# Patient Record
Sex: Male | Born: 1950 | Race: Black or African American | Hispanic: No | Marital: Single | Smoking: Never smoker
Health system: Southern US, Community
[De-identification: ages and names within clinical notes are randomized; demographics above are authoritative.]

## PROBLEM LIST (undated history)

## (undated) DIAGNOSIS — J45909 Unspecified asthma, uncomplicated: Secondary | ICD-10-CM

## (undated) DIAGNOSIS — Z95810 Presence of automatic (implantable) cardiac defibrillator: Secondary | ICD-10-CM

## (undated) DIAGNOSIS — I10 Essential (primary) hypertension: Secondary | ICD-10-CM

---

## 2003-02-19 ENCOUNTER — Emergency Department (HOSPITAL_COMMUNITY): Admission: EM | Admit: 2003-02-19 | Discharge: 2003-02-19 | Payer: Self-pay | Admitting: Emergency Medicine

## 2003-02-19 ENCOUNTER — Encounter: Payer: Self-pay | Admitting: Emergency Medicine

## 2009-04-10 ENCOUNTER — Emergency Department (HOSPITAL_COMMUNITY): Admission: EM | Admit: 2009-04-10 | Discharge: 2009-04-10 | Payer: Self-pay | Admitting: *Deleted

## 2013-11-05 ENCOUNTER — Telehealth: Payer: Self-pay | Admitting: *Deleted

## 2013-11-08 NOTE — Telephone Encounter (Signed)
error 

## 2017-06-30 ENCOUNTER — Inpatient Hospital Stay (HOSPITAL_COMMUNITY)
Admission: EM | Admit: 2017-06-30 | Discharge: 2017-07-03 | DRG: 280 | Disposition: A | Discharge status: Home / Self Care | Payer: Medicare PPO | Source: Other Acute Inpatient Hospital | Attending: Internal Medicine | Admitting: Internal Medicine

## 2017-06-30 ENCOUNTER — Encounter (HOSPITAL_COMMUNITY): Payer: Self-pay

## 2017-06-30 DIAGNOSIS — I214 Non-ST elevation (NSTEMI) myocardial infarction: Secondary | ICD-10-CM | POA: Diagnosis present

## 2017-06-30 DIAGNOSIS — R7989 Other specified abnormal findings of blood chemistry: Secondary | ICD-10-CM

## 2017-06-30 DIAGNOSIS — I110 Hypertensive heart disease with heart failure: Secondary | ICD-10-CM | POA: Diagnosis present

## 2017-06-30 DIAGNOSIS — I5023 Acute on chronic systolic (congestive) heart failure: Secondary | ICD-10-CM | POA: Diagnosis present

## 2017-06-30 DIAGNOSIS — R778 Other specified abnormalities of plasma proteins: Secondary | ICD-10-CM

## 2017-06-30 DIAGNOSIS — I4891 Unspecified atrial fibrillation: Secondary | ICD-10-CM | POA: Diagnosis not present

## 2017-06-30 DIAGNOSIS — I480 Paroxysmal atrial fibrillation: Secondary | ICD-10-CM | POA: Diagnosis present

## 2017-06-30 DIAGNOSIS — J45909 Unspecified asthma, uncomplicated: Secondary | ICD-10-CM | POA: Diagnosis present

## 2017-06-30 DIAGNOSIS — R748 Abnormal levels of other serum enzymes: Secondary | ICD-10-CM | POA: Diagnosis not present

## 2017-06-30 DIAGNOSIS — I10 Essential (primary) hypertension: Secondary | ICD-10-CM

## 2017-06-30 DIAGNOSIS — I5022 Chronic systolic (congestive) heart failure: Secondary | ICD-10-CM

## 2017-06-30 HISTORY — DX: Essential (primary) hypertension: I10

## 2017-06-30 HISTORY — DX: Unspecified asthma, uncomplicated: J45.909

## 2017-07-01 ENCOUNTER — Encounter (HOSPITAL_COMMUNITY)
Admission: EM | Disposition: A | Discharge status: Home / Self Care | Payer: Self-pay | Source: Other Acute Inpatient Hospital | Attending: Internal Medicine

## 2017-07-01 ENCOUNTER — Encounter (HOSPITAL_COMMUNITY): Payer: Self-pay

## 2017-07-01 DIAGNOSIS — I10 Essential (primary) hypertension: Secondary | ICD-10-CM

## 2017-07-01 DIAGNOSIS — R748 Abnormal levels of other serum enzymes: Secondary | ICD-10-CM

## 2017-07-01 DIAGNOSIS — R7989 Other specified abnormal findings of blood chemistry: Secondary | ICD-10-CM

## 2017-07-01 DIAGNOSIS — I4891 Unspecified atrial fibrillation: Secondary | ICD-10-CM

## 2017-07-01 DIAGNOSIS — R778 Other specified abnormalities of plasma proteins: Secondary | ICD-10-CM

## 2017-07-01 DIAGNOSIS — I214 Non-ST elevation (NSTEMI) myocardial infarction: Principal | ICD-10-CM

## 2017-07-01 HISTORY — PX: LEFT HEART CATH AND CORONARY ANGIOGRAPHY: CATH118249

## 2017-07-01 LAB — CBC WITH DIFFERENTIAL/PLATELET
BASOS PCT: 0 %
Basophils Absolute: 0 10*3/uL (ref 0.0–0.1)
EOS ABS: 0 10*3/uL (ref 0.0–0.7)
EOS PCT: 1 %
HCT: 40.6 % (ref 39.0–52.0)
HEMOGLOBIN: 13.9 g/dL (ref 13.0–17.0)
LYMPHS ABS: 2.2 10*3/uL (ref 0.7–4.0)
Lymphocytes Relative: 29 %
MCH: 31.5 pg (ref 26.0–34.0)
MCHC: 34.2 g/dL (ref 30.0–36.0)
MCV: 92.1 fL (ref 78.0–100.0)
MONO ABS: 0.6 10*3/uL (ref 0.1–1.0)
MONOS PCT: 8 %
Neutro Abs: 4.8 10*3/uL (ref 1.7–7.7)
Neutrophils Relative %: 62 %
Platelets: 179 10*3/uL (ref 150–400)
RBC: 4.41 MIL/uL (ref 4.22–5.81)
RDW: 12.9 % (ref 11.5–15.5)
WBC: 7.7 10*3/uL (ref 4.0–10.5)

## 2017-07-01 LAB — TROPONIN I
TROPONIN I: 2.83 ng/mL — AB (ref <0.03)
TROPONIN I: 2.07 ng/mL — AB (ref <0.03)
TROPONIN I: 1.81 ng/mL — AB (ref <0.03)

## 2017-07-01 LAB — COMPREHENSIVE METABOLIC PANEL
ALBUMIN: 3.8 g/dL (ref 3.5–5.0)
ALT: 18 U/L (ref 17–63)
AST: 34 U/L (ref 15–41)
Alkaline Phosphatase: 67 U/L (ref 38–126)
Anion gap: 8 (ref 5–15)
BUN: 19 mg/dL (ref 6–20)
CHLORIDE: 104 mmol/L (ref 101–111)
CO2: 26 mmol/L (ref 22–32)
Calcium: 9.2 mg/dL (ref 8.9–10.3)
Creatinine, Ser: 1.1 mg/dL (ref 0.61–1.24)
GFR calc Af Amer: >60 mL/min (ref >60)
GFR calc non Af Amer: >60 mL/min (ref >60)
GLUCOSE: 111 mg/dL — AB (ref 65–99)
Potassium: 3.9 mmol/L (ref 3.5–5.1)
SODIUM: 138 mmol/L (ref 135–145)
Total Bilirubin: 0.8 mg/dL (ref 0.3–1.2)
Total Protein: 6.7 g/dL (ref 6.5–8.1)

## 2017-07-01 LAB — HIV ANTIBODY (ROUTINE TESTING W REFLEX): HIV SCREEN 4TH GENERATION: NONREACTIVE

## 2017-07-01 LAB — MRSA PCR SCREENING: MRSA by PCR: NEGATIVE

## 2017-07-01 LAB — BRAIN NATRIURETIC PEPTIDE: B Natriuretic Peptide: 174.7 pg/mL — ABNORMAL HIGH (ref 0.0–100.0)

## 2017-07-01 LAB — TSH: TSH: 2.016 u[IU]/mL (ref 0.350–4.500)

## 2017-07-01 LAB — T4, FREE: Free T4: 0.82 ng/dL (ref 0.61–1.12)

## 2017-07-01 LAB — HEMOGLOBIN A1C
HEMOGLOBIN A1C: 5.4 % (ref 4.8–5.6)
MEAN PLASMA GLUCOSE: 108.28 mg/dL

## 2017-07-01 SURGERY — LEFT HEART CATH AND CORONARY ANGIOGRAPHY
Anesthesia: LOCAL

## 2017-07-01 MED ORDER — SODIUM CHLORIDE 0.9 % IV SOLN: 250.0000 mL | INTRAVENOUS | Status: DC | PRN

## 2017-07-01 MED ORDER — NITROGLYCERIN 0.4 MG SL SUBL: 0.4000 mg | SUBLINGUAL_TABLET | SUBLINGUAL | Status: DC | PRN

## 2017-07-01 MED ORDER — ACETAMINOPHEN 325 MG PO TABS: 650.0000 mg | ORAL_TABLET | ORAL | Status: DC | PRN

## 2017-07-01 MED ORDER — FENTANYL CITRATE (PF) 100 MCG/2ML IJ SOLN
INTRAMUSCULAR | Status: AC
  Filled 2017-07-01: qty 2

## 2017-07-01 MED ORDER — SODIUM CHLORIDE 0.9 % WEIGHT BASED INFUSION: 3.0000 mL/kg/h | INTRAVENOUS | Status: DC

## 2017-07-01 MED ORDER — HEPARIN (PORCINE) IN NACL 100-0.45 UNIT/ML-% IJ SOLN
1500.0000 [IU]/h | INTRAMUSCULAR | Status: DC
  Administered 2017-07-01: 1500 [IU]/h via INTRAVENOUS

## 2017-07-01 MED ORDER — IOPAMIDOL (ISOVUE-370) INJECTION 76%
INTRAVENOUS | Status: DC | PRN
  Administered 2017-07-01: 30 mL via INTRAVENOUS

## 2017-07-01 MED ORDER — HEPARIN (PORCINE) IN NACL 100-0.45 UNIT/ML-% IJ SOLN
1400.0000 [IU]/h | INTRAMUSCULAR | Status: DC
  Administered 2017-07-01: 1400 [IU]/h via INTRAVENOUS
  Filled 2017-07-01: qty 250

## 2017-07-01 MED ORDER — FENTANYL CITRATE (PF) 100 MCG/2ML IJ SOLN
INTRAMUSCULAR | Status: DC | PRN
  Administered 2017-07-01: 25 ug via INTRAVENOUS

## 2017-07-01 MED ORDER — APIXABAN 5 MG PO TABS
5.0000 mg | ORAL_TABLET | Freq: Two times a day (BID) | ORAL | Status: DC
  Administered 2017-07-01 – 2017-07-03 (×4): 5 mg via ORAL
  Filled 2017-07-01 (×4): qty 1

## 2017-07-01 MED ORDER — LIDOCAINE HCL (PF) 1 % IJ SOLN
INTRAMUSCULAR | Status: AC
  Filled 2017-07-01: qty 30

## 2017-07-01 MED ORDER — METOPROLOL TARTRATE 12.5 MG HALF TABLET
12.5000 mg | ORAL_TABLET | Freq: Two times a day (BID) | ORAL | Status: DC
  Administered 2017-07-01 – 2017-07-02 (×3): 12.5 mg via ORAL
  Filled 2017-07-01 (×3): qty 1

## 2017-07-01 MED ORDER — HEPARIN (PORCINE) IN NACL 2-0.9 UNIT/ML-% IJ SOLN
INTRAMUSCULAR | Status: AC | PRN
  Administered 2017-07-01: 1000 mL

## 2017-07-01 MED ORDER — ONDANSETRON HCL 4 MG/2ML IJ SOLN: 4.0000 mg | Freq: Four times a day (QID) | INTRAMUSCULAR | Status: DC | PRN

## 2017-07-01 MED ORDER — HEPARIN BOLUS VIA INFUSION
5000.0000 [IU] | Freq: Once | INTRAVENOUS | Status: AC
  Administered 2017-07-01: 5000 [IU] via INTRAVENOUS
  Filled 2017-07-01: qty 5000

## 2017-07-01 MED ORDER — SODIUM CHLORIDE 0.9 % IV SOLN
INTRAVENOUS | Status: DC
  Administered 2017-07-01: 02:00:00 via INTRAVENOUS

## 2017-07-01 MED ORDER — SODIUM CHLORIDE 0.9% FLUSH
3.0000 mL | Freq: Two times a day (BID) | INTRAVENOUS | Status: DC
  Administered 2017-07-01 – 2017-07-03 (×3): 3 mL via INTRAVENOUS

## 2017-07-01 MED ORDER — SODIUM CHLORIDE 0.9 % IV SOLN
INTRAVENOUS | Status: AC
  Administered 2017-07-01: 12:00:00 via INTRAVENOUS

## 2017-07-01 MED ORDER — ATORVASTATIN CALCIUM 10 MG PO TABS: 10.0000 mg | ORAL_TABLET | Freq: Every day | ORAL | Status: DC

## 2017-07-01 MED ORDER — ASPIRIN 81 MG PO CHEW
81.0000 mg | CHEWABLE_TABLET | ORAL | Status: AC
  Administered 2017-07-01: 81 mg via ORAL
  Filled 2017-07-01: qty 1

## 2017-07-01 MED ORDER — HEPARIN SODIUM (PORCINE) 1000 UNIT/ML IJ SOLN
INTRAMUSCULAR | Status: DC | PRN
  Administered 2017-07-01: 6000 [IU] via INTRAVENOUS

## 2017-07-01 MED ORDER — SODIUM CHLORIDE 0.9% FLUSH: 3.0000 mL | Freq: Two times a day (BID) | INTRAVENOUS | Status: DC

## 2017-07-01 MED ORDER — AMLODIPINE BESYLATE 2.5 MG PO TABS
2.5000 mg | ORAL_TABLET | Freq: Every day | ORAL | Status: DC
  Administered 2017-07-01 – 2017-07-02 (×2): 2.5 mg via ORAL
  Filled 2017-07-01 (×2): qty 1

## 2017-07-01 MED ORDER — MIDAZOLAM HCL 2 MG/2ML IJ SOLN
INTRAMUSCULAR | Status: AC
  Filled 2017-07-01: qty 2

## 2017-07-01 MED ORDER — SODIUM CHLORIDE 0.9% FLUSH: 3.0000 mL | INTRAVENOUS | Status: DC | PRN

## 2017-07-01 MED ORDER — MIDAZOLAM HCL 2 MG/2ML IJ SOLN
INTRAMUSCULAR | Status: DC | PRN
  Administered 2017-07-01: 2 mg via INTRAVENOUS

## 2017-07-01 MED ORDER — HEPARIN (PORCINE) IN NACL 2-0.9 UNIT/ML-% IJ SOLN
INTRAMUSCULAR | Status: AC
  Filled 2017-07-01: qty 1000

## 2017-07-01 MED ORDER — HEPARIN (PORCINE) IN NACL 100-0.45 UNIT/ML-% IJ SOLN: 1500.0000 [IU]/h | INTRAMUSCULAR | Status: AC

## 2017-07-01 MED ORDER — AMLODIPINE-ATORVASTATIN 2.5-10 MG PO TABS: 1.0000 | ORAL_TABLET | Freq: Every day | ORAL | Status: DC

## 2017-07-01 MED ORDER — ASPIRIN EC 81 MG PO TBEC: 81.0000 mg | DELAYED_RELEASE_TABLET | Freq: Every day | ORAL | Status: DC

## 2017-07-01 MED ORDER — HEPARIN (PORCINE) IN NACL 100-0.45 UNIT/ML-% IJ SOLN: 1400.0000 [IU]/h | INTRAMUSCULAR | Status: DC

## 2017-07-01 MED ORDER — ASPIRIN EC 81 MG PO TBEC
81.0000 mg | DELAYED_RELEASE_TABLET | Freq: Every day | ORAL | Status: DC
  Administered 2017-07-02 – 2017-07-03 (×2): 81 mg via ORAL
  Filled 2017-07-01 (×2): qty 1

## 2017-07-01 MED ORDER — HEPARIN SODIUM (PORCINE) 1000 UNIT/ML IJ SOLN
INTRAMUSCULAR | Status: AC
  Filled 2017-07-01: qty 1

## 2017-07-01 MED ORDER — VERAPAMIL HCL 2.5 MG/ML IV SOLN
INTRAVENOUS | Status: AC
  Filled 2017-07-01: qty 2

## 2017-07-01 MED ORDER — VERAPAMIL HCL 2.5 MG/ML IV SOLN
INTRAVENOUS | Status: DC | PRN
  Administered 2017-07-01: 10 mL via INTRA_ARTERIAL

## 2017-07-01 MED ORDER — ATORVASTATIN CALCIUM 40 MG PO TABS
40.0000 mg | ORAL_TABLET | Freq: Every day | ORAL | Status: DC
  Administered 2017-07-01 – 2017-07-03 (×3): 40 mg via ORAL
  Filled 2017-07-01 (×3): qty 1

## 2017-07-01 MED ORDER — LIDOCAINE HCL (PF) 1 % IJ SOLN
INTRAMUSCULAR | Status: DC | PRN
  Administered 2017-07-01: 2 mL

## 2017-07-01 MED ORDER — SODIUM CHLORIDE 0.9 % WEIGHT BASED INFUSION: 1.0000 mL/kg/h | INTRAVENOUS | Status: DC

## 2017-07-01 SURGICAL SUPPLY — 12 items
CATH EXPO 5F FL3.5 (CATHETERS) ×2 IMPLANT
CATH INFINITI JR4 5F (CATHETERS) ×2 IMPLANT
COVER PRB 48X5XTLSCP FOLD TPE (BAG) ×1 IMPLANT
COVER PROBE 5X48 (BAG) ×1
DEVICE RAD COMP TR BAND LRG (VASCULAR PRODUCTS) ×2 IMPLANT
GLIDESHEATH SLEND SS 6F .021 (SHEATH) ×2 IMPLANT
GUIDEWIRE INQWIRE 1.5J.035X260 (WIRE) ×1 IMPLANT
INQWIRE 1.5J .035X260CM (WIRE) ×2
KIT HEART LEFT (KITS) ×2 IMPLANT
PACK CARDIAC CATHETERIZATION (CUSTOM PROCEDURE TRAY) ×2 IMPLANT
TRANSDUCER W/STOPCOCK (MISCELLANEOUS) ×2 IMPLANT
TUBING CIL FLEX 10 FLL-RA (TUBING) ×2 IMPLANT

## 2017-07-01 NOTE — Progress Notes (Signed)
Reported increased troponin 2.83, decreased HR in low 40's, and NSR junctional to Dr. Tiburcio Pea.  No new orders at this time. Pt is on heparin gtt, and resting with eyes closed. nonlabored breathing noted. Will continue to monitor.

## 2017-07-01 NOTE — Progress Notes (Signed)
ANTICOAGULATION CONSULT NOTE Pharmacy Consult for Heparin  Indication: Afib  No Known Allergies  Patient Measurements: Height: 6\' 1"  (185.4 cm) Weight: 286 lb 9.6 oz (130 kg) IBW/kg (Calculated) : 79.9  Vital Signs: Temp: 97.5 F (36.4 C) (09/04 1141) Temp Source: Oral (09/04 1141) BP: 125/81 (09/04 1141) Pulse Rate: 53 (09/04 1141)  Labs: Outside hospital   Medical History: Past Medical History:  Diagnosis Date  . Asthma   . Hypertension    Assessment: 66 y/o M transfer from outside hospital with new onset afib.  Now s/p cath and to resume heparin 6 hours post sheath removal  Goal of Therapy:  Heparin level 0.3-0.7 units/ml Monitor platelets by anticoagulation protocol: Yes   Plan:  Resume heparin at 1500 units / hr at 6 pm Daily heparin level, CBC  Thank you Okey Regal, PharmD [phone]  07/01/2017,12:59 PM

## 2017-07-01 NOTE — H&P (Signed)
CARDIOLOGY HISTORY AND PHYSICAL     Date: 07/01/2017 Admitting Physician: Thurmon Fair, MD  Chief Complaint:   ____________________________________________________________________ History of Present Illness:  Craig Robinson is a 66 y.o. old male with medical history noted below presents from an OSH with complaints of dizziness and sweating after doing yard work.  He states this came on suddenly and he thought it was secondary to him being dehydrated.  He's only had one other episode like this before about a year ago with similar circumstances though that resolved in about 5 minutes.  He states he went home and his wife helped him lay down.  After 15-20 minutes the feelings had not resolved and he presented to the OSH ED.  He states on the way to the hospital he suddenly felt better to the point he wanted to go back home, however, his wife insisted he go to the hospital.  There he was found to be in atrial fib with rates of 144.  He was also found to have an initial troponin of 0.07 which increased to 0.21.  Creatinine in the ED elevated at 1.64.  He denies chest pain or pressure.  Some dyspnea with this episode.    Review of Systems:   Review of Systems:  GEN: no fever, chills, nausea, vomiting, weight change  HEENT: no vision or hearing changes  PULM: no coughing, +SOB  CV: no chest pain, palpitations, PND, orthopnea  GI: no abdominal pain  GU: no dysuria  EXT: no swelling  SKIN: no rashes  NEURO: no numbness or tingling, +dizziness  HEME: no bleeding or bruising  GYN: none  --12 point review systems- otherwise negative.  All other systems reviewed and are negative  Past Medical History:  Diagnosis Date  . Asthma   . Hypertension     History reviewed. No pertinent surgical history.  Social History   Social History  . Marital status: Single    Spouse name: N/A  . Number of children: N/A  . Years of education: N/A   Occupational History  . Not on file.   Social  History Main Topics  . Smoking status: Never Smoker  . Smokeless tobacco: Never Used  . Alcohol use No  . Drug use: No  . Sexual activity: Not Currently    Partners: Female   Other Topics Concern  . Not on file   Social History Narrative  . No narrative on file    Family History  Problem Relation Age of Onset  . Alzheimer's disease Mother   . Cancer Father     Past Cardiovascular History:  - No documented h/o CAD - No documented h/o MI - No documented h/o CHF - No documented h/o PVD - No documented h/o AAA - No documented h/o valvular heart disease - No documented h/o CVA - No documented h/o Arrhythmias - No documented h/o A-fib  - No documented h/o congenital heart disease - No documented h/o CABG - No documented h/o PCI - No documented h/o cardiac devices (Pacer/ICD/CRT) - No documented h/o cardiac surgery       Most recent stress test:  None  Most recent echocardiography:  None  Most recent left heart catheterization:  None  CABG:  Date/ Physician: None  Device history:  None  Prior to Admission medications   Not on File    No Known Allergies  Social History:   Social History  Substance Use Topics  . Smoking status: Never Smoker  . Smokeless tobacco:  Never Used  . Alcohol use No    Family History  Problem Relation Age of Onset  . Alzheimer's disease Mother   . Cancer Father     Physical Examination: Blood pressure 139/85, pulse (!) 57, temperature 98 F (36.7 C), temperature source Oral, height 6\' 1"  (1.854 m), weight 126.6 kg (279 lb 1.6 oz), SpO2 97 %. General:  AAOX 4.  NAD.  NRD. Obese HENT: Normocephalic. Atraumatic.  No acute abnom. EYES: PERRL EOMI  Neck: Supple.  No JVD.  No bruits. Cardiovascular:  Nl S1. Nl S2. No S3. No S4. Nl PMI. No m/r/c. RRR  Pulmonary/Chest: CTA B. No rales. No wheezing.  Abdomen: Soft, NT, no masses, no organomegaly. Neuro: CN intact, no motor/sensory deficit.  Ext: Warm. No edema.  SKIN- intact  No  intake or output data in the 24 hours ending 07/01/17 0023  Troponin (Point of Care Test) No results for input(s): TROPIPOC in the last 72 hours. ____________________________________________________________________ Assessment/Plan  NSTEMI   Assessment:  Patient with rising troponin after period of lightheadedness and dizziness.  Troponin is elevated in the context of elevated Cr and afib w/RVR.  Will treat as NSTEMI and plan for cath lab tomorrow   Plan  -  Heparin drip  -  Beta blockade and statin  -  ASA given at the OSH  -  NPO  -  ECHO  -  Cath lab in the morning  Afib   Assessment:  New onset atrial fib.  In sinus on presentation to [facility].  Anticoagulation with heparin for now.  Will need to go home with anticoagulation.  Could also benefit from a monitor to measure Afib burden.   Plan  -  Anticoagulation  -  Eliquis 5 mg BID for home anticoagulation  HTN   Assessment:  Stable   Plan  -  Continue norvasc Thank you for consulting cardiology.    Electronically signed by Nada Maclachlan 07/01/2017 Link Snuffer, MD, PhD Cardiology

## 2017-07-01 NOTE — Care Management Note (Signed)
Case Management Note  Patient Details  Name: Craig Robinson MRN: [id number] Date of Birth: [birth year]  Subjective/Objective:    Pt admitted with NSTEMI - will go to cath lab today 07/01/17               Action/Plan:  PTA from home with wife.  CM will continue to follow for discharge needs   Expected Discharge Date:                  Expected Discharge Plan:     In-House Referral:     Discharge planning Services  CM Consult  Post Acute Care Choice:    Choice offered to:     DME Arranged:    DME Agency:     HH Arranged:    HH Agency:     Status of Service:     If discussed at [employer], dates discussed:    Additional Comments:  Cherylann Parr, RN 07/01/2017, 9:28 AM

## 2017-07-01 NOTE — Progress Notes (Signed)
ANTICOAGULATION CONSULT NOTE - Initial Consult  Pharmacy Consult for Heparin  Indication: chest pain/ACS and atrial fibrillation  No Known Allergies  Patient Measurements: Height: 6\' 1"  (185.4 cm) Weight: 279 lb 1.6 oz (126.6 kg) IBW/kg (Calculated) : 79.9  Vital Signs: Temp: 98 F (36.7 C) (09/03 2350) Temp Source: Oral (09/03 2350) BP: 139/85 (09/03 2350) Pulse Rate: 57 (09/03 2350)  Labs: Outside hospital   Medical History: Past Medical History:  Diagnosis Date  . Asthma   . Hypertension    Assessment: 66 y/o M transfer from outside hospital with NSTEMI and new onset afib. No heparin drip running from outside hospital confirmed with RN.   Goal of Therapy:  Heparin level 0.3-0.7 units/ml Monitor platelets by anticoagulation protocol: Yes   Plan:  -Heparin 5000 units BOLUS -Start heparin drip at 1400 units/hr -1100 HL  Nyzier Boivin 07/01/2017,1:13 AM

## 2017-07-01 NOTE — Progress Notes (Signed)
ANTICOAGULATION CONSULT NOTE  Pharmacy Consult for Heparin  Indication: Afib  No Known Allergies  Patient Measurements: Height: 6\' 1"  (185.4 cm) Weight: 286 lb 9.6 oz (130 kg) IBW/kg (Calculated) : 79.9  Vital Signs: Temp: 97.8 F (36.6 C) (09/04 1654) Temp Source: Oral (09/04 1654) BP: 114/71 (09/04 1654) Pulse Rate: 53 (09/04 1141)  Labs: Outside hospital   Medical History: Past Medical History:  Diagnosis Date  . Asthma   . Hypertension    Assessment: 66 YOM transfer from outside hospital with new onset Afib starting on Heparin for anticoagulation - now to transition to apixaban. The patient's sheath was removed at 11:20 - the consult wanted the first dose to start ~12 hours after sheath removal. Heparin was ordered earlier today to star 6 hours after sheath removal and is currently running. Will continue the drip until time to transition to apixaban.   Given the patient's age<41 years old, wt>60 kg, and SCr<1.5, will start the patient with 5 mg bid dosing. CBC wnl.   Goal of Therapy:  Heparin level 0.3-0.7 units/ml Monitor platelets by anticoagulation protocol: Yes   Plan:  1. Continue Heparin at 1500 units/hr until 2259 today 2. Start apixaban 5 mg bid starting at 2300 today 3. Instructions given to RN regarding transition 4. Pharmacy will plan to provide education prior to discharge 5. Pharmacy will sign off of apixaban consult as no dose adjustments expected at this time however will continue to monitor peripherally  Thank you for allowing pharmacy to be a part of this patient's care.  Georgina Pillion, PharmD, BCPS Clinical Pharmacist Pager: [phone] Clinical phone for 07/01/2017 from 2:30p-11p: [phone] If after 3:30p, please call main pharmacy at: [phone] 07/01/2017 6:44 PM

## 2017-07-01 NOTE — Interval H&P Note (Signed)
Cath Lab Visit (complete for each Cath Lab visit)  Clinical Evaluation Leading to the Procedure:   ACS: Yes.    Non-ACS:    Anginal Classification: CCS IV  Anti-ischemic medical therapy: Minimal Therapy (1 class of medications)  Non-Invasive Test Results: No non-invasive testing performed  Prior CABG: No previous CABG      History and Physical Interval Note:  07/01/2017 10:37 AM  Anselm Jungling  has presented today for surgery, with the diagnosis of cp  The various methods of treatment have been discussed with the patient and family. After consideration of risks, benefits and other options for treatment, the patient has consented to  Procedure(s): LEFT HEART CATH AND CORONARY ANGIOGRAPHY (N/A) as a surgical intervention .  The patient's history has been reviewed, patient examined, no change in status, stable for surgery.  I have reviewed the patient's chart and labs.  Questions were answered to the patient's satisfaction.     Lance Muss

## 2017-07-01 NOTE — Progress Notes (Signed)
The patient has been seen in conjunction with Laverda Page, NP-C. All aspects of care have been considered and discussed. The patient has been personally interviewed, examined, and all clinical data has been reviewed.   Paroxysmal atrial fibrillation with RVR. Possible prior occurrences dating back one year. CHADS VASC 2 (Htn and vascular disease)  Elevated troponin, with a level consistent with non-ST elevation ACS. Rule out supply demand mismatch in setting of atrial fibrillation. Agree with plans to pursue coronary angiography.  Lipid panel needs to be performed. Will need oral anticoagulation as an outpatient and a 30 day monitor. 2-D echocardiogram needs to be performed.  Coronary angiography for evaluation/identification of CAD in the setting of elevated cardiac markers. Procedure and risks including stroke, death, myocardial infarction less than 1% and bleeding, kidney injury, allergy, greater than or equal to 1% risk. Patient is agreeable to proceed.  Progress Note  Patient Name: Craig Robinson Date of Encounter: 07/01/2017  Primary Cardiologist: New  Subjective   No complaints this morning.   Inpatient Medications    Scheduled Meds: . amLODipine  2.5 mg Oral Daily  . aspirin EC  81 mg Oral Daily  . atorvastatin  10 mg Oral Daily  . metoprolol tartrate  12.5 mg Oral BID   Continuous Infusions: . sodium chloride 75 mL/hr at 07/01/17 0301  . heparin 1,400 Units/hr (07/01/17 0400)   PRN Meds: acetaminophen, nitroGLYCERIN, ondansetron (ZOFRAN) IV   Vital Signs    Vitals:   06/30/17 2350 06/30/17 2356 07/01/17 0300 07/01/17 0400  BP: 139/85  111/71   Pulse: (!) 57     Resp: 16  (!) 22 14  Temp: 98 F (36.7 C)  (!) 97.4 F (36.3 C)   TempSrc: Oral  Oral   SpO2: 96% 97% 96%   Weight: 279 lb 1.6 oz (126.6 kg)     Height: 6\' 1"  (1.854 m)       Intake/Output Summary (Last 24 hours) at 07/01/17 0806 Last data filed at 07/01/17 0400  Gross per 24 hour    Intake           302.07 ml  Output              575 ml  Net          -272.93 ml   Filed Weights   06/30/17 2350  Weight: 279 lb 1.6 oz (126.6 kg)    Telemetry    SR - Personally Reviewed  ECG    SR with TWI in lead III - Personally Reviewed  Physical Exam   General: Well developed, well nourished, older AA male appearing in no acute distress. Head: Normocephalic, atraumatic.  Neck: Supple without bruits, JVD. Lungs:  Resp regular and unlabored, CTA. Heart: RRR, S1, S2, no S3, S4, or murmur; no rub. Abdomen: Soft, non-tender, non-distended with normoactive bowel sounds. No hepatomegaly. No rebound/guarding. No obvious abdominal masses. Extremities: No clubbing, cyanosis, edema. Distal pedal pulses are 2+ bilaterally. Neuro: Alert and oriented X 3. Moves all extremities spontaneously. Psych: Normal affect.  Labs    Chemistry Recent Labs Lab 07/01/17 0145  NA 138  K 3.9  CL 104  CO2 26  GLUCOSE 111*  BUN 19  CREATININE 1.10  CALCIUM 9.2  PROT 6.7  ALBUMIN 3.8  AST 34  ALT 18  ALKPHOS 67  BILITOT 0.8  GFRNONAA >60  GFRAA >60  ANIONGAP 8     Hematology Recent Labs Lab 07/01/17 0145  WBC 7.7  RBC  4.41  HGB 13.9  HCT 40.6  MCV 92.1  MCH 31.5  MCHC 34.2  RDW 12.9  PLT 179    Cardiac Enzymes Recent Labs Lab 07/01/17 0145  TROPONINI 2.83*   No results for input(s): TROPIPOC in the last 168 hours.   BNP Recent Labs Lab 07/01/17 0145  BNP 174.7*     DDimer No results for input(s): DDIMER in the last 168 hours.    Radiology    No results found.  Cardiac Studies     Patient Profile     66 y.o. male with PMH of HTN who presented to [facility] with dizziness, weakness and nausea after mowing yards. Found to have new onset Afib RVR, converted to SR there. Troponin elevation of 0.07>>0.21 and was transferred to [facility] for further work up.   Assessment & Plan    1. NSTEMI: Trop 2.83 this morning. No further chest pain reported.  Remains on IV heparin. Cr improved from 1.6>>1.10 this morning.  -- started on BB, ASA, statin therapy on admission. Increase statin therapy from Lipitor 10mg  to 40mg  -- plan for cath today -- The patient understands that risks included but are not limited to stroke (1 in 1000), death (1 in 1000), kidney failure [usually temporary] (1 in 500), bleeding (1 in 200), allergic reaction [possibly serious] (1 in 200).  -- echo pending  2. Afib RVR: Found on admission at [facility], but converted to SR there prior to transport. Currently on IV heparin -- TSH normal -- CHA2DS2-VASc Score of at least 2 for Age and HTN. Will need to determine [facility] post cath.   3. HTN: Stable with current therapy -- Norvasc and metoprolol    Signed, Laverda Page, NP  07/01/2017, 8:06 AM  Pager # [phone]

## 2017-07-01 NOTE — H&P (View-Only) (Signed)
The patient has been seen in conjunction with Laverda Page, NP-C. All aspects of care have been considered and discussed. The patient has been personally interviewed, examined, and all clinical data has been reviewed.   Paroxysmal atrial fibrillation with RVR. Possible prior occurrences dating back one year. CHADS VASC 2 (Htn and vascular disease)  Elevated troponin, with a level consistent with non-ST elevation ACS. Rule out supply demand mismatch in setting of atrial fibrillation. Agree with plans to pursue coronary angiography.  Lipid panel needs to be performed. Will need oral anticoagulation as an outpatient and a 30 day monitor. 2-D echocardiogram needs to be performed.  Coronary angiography for evaluation/identification of CAD in the setting of elevated cardiac markers. Procedure and risks including stroke, death, myocardial infarction less than 1% and bleeding, kidney injury, allergy, greater than or equal to 1% risk. Patient is agreeable to proceed.  Progress Note  Patient Name: Craig Robinson Date of Encounter: 07/01/2017  Primary Cardiologist: New  Subjective   No complaints this morning.   Inpatient Medications    Scheduled Meds: . amLODipine  2.5 mg Oral Daily  . aspirin EC  81 mg Oral Daily  . atorvastatin  10 mg Oral Daily  . metoprolol tartrate  12.5 mg Oral BID   Continuous Infusions: . sodium chloride 75 mL/hr at 07/01/17 0301  . heparin 1,400 Units/hr (07/01/17 0400)   PRN Meds: acetaminophen, nitroGLYCERIN, ondansetron (ZOFRAN) IV   Vital Signs    Vitals:   06/30/17 2350 06/30/17 2356 07/01/17 0300 07/01/17 0400  BP: 139/85  111/71   Pulse: (!) 57     Resp: 16  (!) 22 14  Temp: 98 F (36.7 C)  (!) 97.4 F (36.3 C)   TempSrc: Oral  Oral   SpO2: 96% 97% 96%   Weight: 279 lb 1.6 oz (126.6 kg)     Height: 6\' 1"  (1.854 m)       Intake/Output Summary (Last 24 hours) at 07/01/17 0806 Last data filed at 07/01/17 0400  Gross per 24 hour    Intake           302.07 ml  Output              575 ml  Net          -272.93 ml   Filed Weights   06/30/17 2350  Weight: 279 lb 1.6 oz (126.6 kg)    Telemetry    SR - Personally Reviewed  ECG    SR with TWI in lead III - Personally Reviewed  Physical Exam   General: Well developed, well nourished, older AA male appearing in no acute distress. Head: Normocephalic, atraumatic.  Neck: Supple without bruits, JVD. Lungs:  Resp regular and unlabored, CTA. Heart: RRR, S1, S2, no S3, S4, or murmur; no rub. Abdomen: Soft, non-tender, non-distended with normoactive bowel sounds. No hepatomegaly. No rebound/guarding. No obvious abdominal masses. Extremities: No clubbing, cyanosis, edema. Distal pedal pulses are 2+ bilaterally. Neuro: Alert and oriented X 3. Moves all extremities spontaneously. Psych: Normal affect.  Labs    Chemistry Recent Labs Lab 07/01/17 0145  NA 138  K 3.9  CL 104  CO2 26  GLUCOSE 111*  BUN 19  CREATININE 1.10  CALCIUM 9.2  PROT 6.7  ALBUMIN 3.8  AST 34  ALT 18  ALKPHOS 67  BILITOT 0.8  GFRNONAA >60  GFRAA >60  ANIONGAP 8     Hematology Recent Labs Lab 07/01/17 0145  WBC 7.7  RBC  4.41  HGB 13.9  HCT 40.6  MCV 92.1  MCH 31.5  MCHC 34.2  RDW 12.9  PLT 179    Cardiac Enzymes Recent Labs Lab 07/01/17 0145  TROPONINI 2.83*   No results for input(s): TROPIPOC in the last 168 hours.   BNP Recent Labs Lab 07/01/17 0145  BNP 174.7*     DDimer No results for input(s): DDIMER in the last 168 hours.    Radiology    No results found.  Cardiac Studies     Patient Profile     66 y.o. male with PMH of HTN who presented to [facility] with dizziness, weakness and nausea after mowing yards. Found to have new onset Afib RVR, converted to SR there. Troponin elevation of 0.07>>0.21 and was transferred to [facility] for further work up.   Assessment & Plan    1. NSTEMI: Trop 2.83 this morning. No further chest pain reported.  Remains on IV heparin. Cr improved from 1.6>>1.10 this morning.  -- started on BB, ASA, statin therapy on admission. Increase statin therapy from Lipitor 10mg  to 40mg  -- plan for cath today -- The patient understands that risks included but are not limited to stroke (1 in 1000), death (1 in 1000), kidney failure [usually temporary] (1 in 500), bleeding (1 in 200), allergic reaction [possibly serious] (1 in 200).  -- echo pending  2. Afib RVR: Found on admission at [facility], but converted to SR there prior to transport. Currently on IV heparin -- TSH normal -- CHA2DS2-VASc Score of at least 2 for Age and HTN. Will need to determine [facility] post cath.   3. HTN: Stable with current therapy -- Norvasc and metoprolol    Signed, Laverda Page, NP  07/01/2017, 8:06 AM  Pager # [phone]

## 2017-07-02 ENCOUNTER — Other Ambulatory Visit: Payer: Self-pay | Admitting: Cardiology

## 2017-07-02 ENCOUNTER — Telehealth: Payer: Self-pay | Admitting: Physician Assistant

## 2017-07-02 ENCOUNTER — Inpatient Hospital Stay (HOSPITAL_COMMUNITY): Payer: Medicare PPO

## 2017-07-02 DIAGNOSIS — I5023 Acute on chronic systolic (congestive) heart failure: Secondary | ICD-10-CM

## 2017-07-02 DIAGNOSIS — I5022 Chronic systolic (congestive) heart failure: Secondary | ICD-10-CM

## 2017-07-02 DIAGNOSIS — I214 Non-ST elevation (NSTEMI) myocardial infarction: Secondary | ICD-10-CM

## 2017-07-02 DIAGNOSIS — I480 Paroxysmal atrial fibrillation: Secondary | ICD-10-CM

## 2017-07-02 LAB — ECHOCARDIOGRAM COMPLETE
HEIGHTINCHES: 73 in
WEIGHTICAEL: 4585.57 [oz_av]

## 2017-07-02 LAB — CBC
HCT: 39.7 % (ref 39.0–52.0)
Hemoglobin: 13.3 g/dL (ref 13.0–17.0)
MCH: 31.3 pg (ref 26.0–34.0)
MCHC: 33.5 g/dL (ref 30.0–36.0)
MCV: 93.4 fL (ref 78.0–100.0)
PLATELETS: 164 10*3/uL (ref 150–400)
RBC: 4.25 MIL/uL (ref 4.22–5.81)
RDW: 13.1 % (ref 11.5–15.5)
WBC: 5.8 10*3/uL (ref 4.0–10.5)

## 2017-07-02 MED ORDER — PERFLUTREN LIPID MICROSPHERE
INTRAVENOUS | Status: AC
  Administered 2017-07-02: 2 mL via INTRAVENOUS
  Filled 2017-07-02: qty 10

## 2017-07-02 MED ORDER — OFF THE BEAT BOOK
Freq: Once | Status: AC
  Administered 2017-07-02: 06:00:00
  Filled 2017-07-02: qty 1

## 2017-07-02 MED ORDER — PERFLUTREN LIPID MICROSPHERE
1.0000 mL | INTRAVENOUS | Status: AC | PRN
  Administered 2017-07-02: 2 mL via INTRAVENOUS
  Filled 2017-07-02: qty 10

## 2017-07-02 MED ORDER — LOSARTAN POTASSIUM 25 MG PO TABS
25.0000 mg | ORAL_TABLET | Freq: Every day | ORAL | Status: DC
  Administered 2017-07-02 – 2017-07-03 (×2): 25 mg via ORAL
  Filled 2017-07-02 (×2): qty 1

## 2017-07-02 MED ORDER — CARVEDILOL 3.125 MG PO TABS
3.1250 mg | ORAL_TABLET | Freq: Two times a day (BID) | ORAL | Status: DC
  Administered 2017-07-02: 3.125 mg via ORAL
  Filled 2017-07-02: qty 1

## 2017-07-02 NOTE — Discharge Summary (Signed)
The patient has been seen in conjunction with Laverda Page NP C. All aspects of care have been considered and discussed. The patient has been personally interviewed, examined, and all clinical data has been reviewed.   Systolic heart failure, acute on chronic. Hopefully tachycardia mediated which will increase the likelihood of improvement. ARB and beta blocker therapy initiated.  Continuous anticoagulation therapy as CHADS VASC is 2 or greater.  Converted to Coumadin as an outpatient. Discharged on Eliquis  Discharge Summary    Patient ID: Craig Robinson,  MRN: [id number], DOB/AGE: [birth year] 66 y.o.  Admit date: 06/30/2017 Discharge date: 07/03/2017  Primary Care Provider: Kirstie Peri Primary Cardiologist: Dr. Verdis Prime  Discharge Diagnoses    Principal Problem:   Acute on chronic systolic heart failure [facility]) Active Problems:   NSTEMI (non-ST elevated myocardial infarction) [facility])   Atrial fibrillation with RVR (HCC)   Essential hypertension   Elevated troponin   Allergies No Known Allergies  Diagnostic Studies/Procedures    Cardiac catheterization 07/01/17: Conclusion     LV end diastolic pressure is moderately elevated. LVEDP 21 mm Hg.  There is no aortic valve stenosis.  No angiographically apparent coronary artery disease.  Continue medical therapy for atrial fibrillation. Assess LVEF with echocardiogram.    _____________   Study Conclusions  - Left ventricle: The cavity size was severely dilated. Wall   thickness was normal. Systolic function was severely reduced. The   estimated ejection fraction was in the range of 25% to 30%.   Diffuse hypokinesis. Doppler parameters are consistent with   abnormal left ventricular relaxation (grade 1 diastolic   dysfunction). - Left atrium: The atrium was moderately dilated.  Impressions:  - Definity used; evere global reduction in LV systolic function;   severe LVE; mild diastolic dysfunction;  moderate LAE.  History of Present Illness     Craig Robinson is a 66 y.o. old male with medical history of HTN and asthma who presented from [facility] with complaints of dizziness and sweating after doing yard work.  He stated this came on suddenly and he thought it was secondary to him being dehydrated.  He's only had one other episode like this before about a year ago with similar circumstances though that resolved in about 5 minutes.  He stated that he went home and his wife helped him lay down.  After 15-20 minutes the feelings had not resolved and he presented to the OSH ED.  He stated on the way to the hospital he suddenly felt better to the point he wanted to go back home, however, his wife insisted he go to the hospital.  There he was found to be in atrial fib with rates of 144.  He was also found to have an initial troponin of 0.07 which increased to 0.21.  Creatinine in the ED elevated at 1.64.  He denied chest pain or pressure.  Some dyspnea with this episode.    Rhythm converted to sinus rhythm prior to transport to [facility]. TSH was normal.   Hospital Course     Consultants: None  The patient had paroxysmal atrial fibrillation with RVR. He converted to sinus rhythm spontaneously. Possible prior occurrences dating back one year. CHADS VASC 2 (Htn and vascular disease). He has been started on Eliquis 5 mg bid for stroke risk reduction. He will need a 30 day monitor to assess his afib burden.  The patient had mildly elevated troponins, peak 2.83, secondary to demand ischemia in setting  of afib with RVR. A cardiac cath was done on 07/01/17 showing no angiographic evidence of apparent CAD. An echocardiogram showed decreased EF of 25-30%. Will add Losartan 25 mg daily and follow up as an outpatient in [place].   The patient has a free supply of Eliquis for 30 days but he cannot afford a DOAC after that so he will need to be placed on warfarin. He should be set up with the coumadin clinic  at his follow up on 9/19.  The patient was seen by cardiac rehab, ambulating without problems, maintaining sinus rhythm.   Patient has been seen by Dr. Katrinka Blazing today and deemed ready for discharge home. All follow up appointments have been scheduled. Discharge medications are listed below. _____________  Discharge Vitals Blood pressure 128/80, pulse (!) 48, temperature 98 F (36.7 C), temperature source Oral, resp. rate 14, height 6\' 1"  (1.854 m), weight 286 lb 9.6 oz (130 kg), SpO2 100 %.  Filed Weights   06/30/17 2350 07/01/17 0700  Weight: 279 lb 1.6 oz (126.6 kg) 286 lb 9.6 oz (130 kg)    Labs & Radiologic Studies    CBC  Recent Labs  07/01/17 0145 07/02/17 0248 07/03/17 0343  WBC 7.7 5.8 6.1  NEUTROABS 4.8  --   --   HGB 13.9 13.3 13.3  HCT 40.6 39.7 39.4  MCV 92.1 93.4 91.8  PLT 179 164 163   Basic Metabolic Panel  Recent Labs  07/01/17 0145 07/03/17 0343  NA 138 140  K 3.9 4.3  CL 104 105  CO2 26 27  GLUCOSE 111* 99  BUN 19 10  CREATININE 1.10 0.80  CALCIUM 9.2 9.1   Liver Function Tests  Recent Labs  07/01/17 0145  AST 34  ALT 18  ALKPHOS 67  BILITOT 0.8  PROT 6.7  ALBUMIN 3.8   No results for input(s): LIPASE, AMYLASE in the last 72 hours. Cardiac Enzymes  Recent Labs  07/01/17 0145 07/01/17 0737 07/01/17 1256  TROPONINI 2.83* 2.07* 1.81*   BNP Invalid input(s): POCBNP D-Dimer No results for input(s): DDIMER in the last 72 hours. Hemoglobin A1C  Recent Labs  07/01/17 0145  HGBA1C 5.4   Fasting Lipid Panel No results for input(s): CHOL, HDL, LDLCALC, TRIG, CHOLHDL, LDLDIRECT in the last 72 hours. Thyroid Function Tests  Recent Labs  07/01/17 0145  TSH 2.016   _____________  No results found. Disposition   Pt is being discharged home today in good condition.  Follow-up Plans & Appointments    Follow-up Information    Dyann Kief, PA-C Follow up.   Specialty:  Cardiology Why:  On September 19th at 12:30 for  cardiology hospital follow up. Please bring all of your medications with you to the appointment.  Contact information: [address] [place] [address] [phone]        [facility] Follow up.   Specialty:  Cardiology Why:  You will be called with instructions for a home heart monitor to wear for 30 days. The monitor will be mailed to you.  Contact information: [address] [place] [address] [phone]         Discharge Instructions    Amb Referral to Cardiac Rehabilitation    Complete by:  As directed    Diagnosis:  NSTEMI Comment - atrial fib   Diet - low sodium heart healthy    Complete by:  As directed    Discharge instructions    Complete by:  As directed    PLEASE REMEMBER TO BRING ALL OF YOUR MEDICATIONS TO EACH OF YOUR FOLLOW-UP OFFICE VISITS.  PLEASE ATTEND ALL SCHEDULED FOLLOW-UP APPOINTMENTS.   Activity: Increase activity slowly as tolerated. You may shower, but no soaking baths (or swimming) for 1 week. No driving for 24 hours. No lifting over 5 lbs for 1 week. No sexual activity for 1 week.   You May Return to Work: in 1 week (if applicable)  Wound Care: You may wash cath site gently with soap and water. Keep cath site clean and dry. If you notice pain, swelling, bleeding or pus at your cath site, please call [phone].   Increase activity slowly    Complete by:  As directed       Discharge Medications   Current Discharge Medication List    START taking these medications   Details  apixaban (ELIQUIS) 5 MG TABS tablet Take 1 tablet (5 mg total) by mouth 2 (two) times daily. Qty: 60 tablet, Refills: 0    aspirin 81 MG EC tablet Take 1 tablet (81 mg total) by mouth daily. Qty: 30 tablet, Refills: 11    atorvastatin (LIPITOR) 40 MG tablet Take 1 tablet (40 mg total) by mouth daily. Qty: 30 tablet, Refills: 11    carvedilol (COREG) 3.125 MG tablet Take 1 tablet (3.125 mg total) by mouth 2 (two) times daily with a  meal. Qty: 60 tablet, Refills: 5    losartan (COZAAR) 25 MG tablet Take 1 tablet (25 mg total) by mouth daily. Qty: 30 tablet, Refills: 5      STOP taking these medications     amLODipine (NORVASC) 2.5 MG tablet            Outstanding Labs/Studies   none  Duration of Discharge Encounter   Greater than 30 minutes including physician time.  Signed, Laverda Page NP 07/03/2017, 10:59 AM

## 2017-07-02 NOTE — Progress Notes (Signed)
  Echocardiogram 2D Echocardiogram has been performed.  Craig Robinson 07/02/2017, 10:49 AM

## 2017-07-02 NOTE — Progress Notes (Signed)
CARDIAC REHAB PHASE I   PRE:  Rate/Rhythm: 58 SB  BP:  Supine: 142/91  Sitting:   Standing:    SaO2: 100%RA  MODE:  Ambulation: 320 ft   POST:  Rate/Rhythm: 89 SR PVCs  BP:  Supine:   Sitting: 136/78  Standing:    SaO2: 100%RA 1035-1130 Pt walked 320 ft with steady gait. No CP. Remained in NSR. Tolerated well. MI education completed. Reviewed MI restrictions, NTG use, heart healthy diet and ex ed. Pt has OFF the BEAT booklet and could tell me what atrial fib was. Discussed the reasoning for eliquis. Pt needs to see case manager re eliquis. Discussed CRP 2 and will refer to [facility] program. Pt understands his MI is demand probably from atrial fib and he stated he was dehydrated and exerting self prior to admission.  Luetta Nutting, RN BSN  07/02/2017 11:23 AM

## 2017-07-02 NOTE — Care Management Note (Addendum)
Case Management Note  Patient Details  Name: Craig Robinson MRN: [id number] Date of Birth: 07/08/1951  Subjective/Objective:    Pt admitted with NSTEMI - will go to cath lab today 07/01/17               Action/Plan:  PTA from home with wife.  CM will continue to follow for discharge needs   Expected Discharge Date:                  Expected Discharge Plan:     In-House Referral:     Discharge planning Services  CM Consult  Post Acute Care Choice:    Choice offered to:     DME Arranged:    DME Agency:     HH Arranged:    HH Agency:     Status of Service:     If discussed at [employer], dates discussed:    Additional Comments: 07/02/2017  CM spoke with Dr Katrinka Blazing regarding hardship - CM informed that pt will likely discharge home on Eliqius and transition to coumadin as outpt or he may be required to transition to coumadin in the hospital which will require heparin bridge - Dr Katrinka Blazing to follow up directly with pt prior to discharge - bedside nurse aware  Benefit Check Eliquis at 2.5 and 5.0 mg are both covered at 209.36 for a 30 day supply no prior needed .  CM communicated copay with pt and was informed that he can not afford.  CM text paged attending and informed of hardship with copay - requested callback to CM.  CM also informed bedside nurse with barrier with eliquis discharge  Pt will discharge home on Eliquis.  CM provided free 30 day card and submitted benefit check.  CM called pharmacy of choice [facility] in [place] and they can fill prescription at discharge Cherylann Parr, RN 07/02/2017, 1:29 PM

## 2017-07-02 NOTE — Progress Notes (Signed)
    Follow up echo shows decreased EF of 25-30%. Discussed with Dr. Katrinka Blazing and will add losartan 25mg  daily given reduced EF. Discussed this with patient prior to discharge.   SignedLaverda Page, NP-C 07/02/2017, 2:27 PM Pager: [phone]

## 2017-07-02 NOTE — Progress Notes (Addendum)
Progress Note  Patient Name: Cassady Sanchezgarcia Date of Encounter: 07/02/2017  Primary Cardiologist: Mendel Ryder  Subjective   Feels well this morning. No chest discomfort. Has ambulated with cardiac rehabilitation and has no complaints.  Inpatient Medications    Scheduled Meds: . amLODipine  2.5 mg Oral Daily  . apixaban  5 mg Oral BID  . aspirin EC  81 mg Oral Daily  . atorvastatin  40 mg Oral Daily  . metoprolol tartrate  12.5 mg Oral BID  . sodium chloride flush  3 mL Intravenous Q12H   Continuous Infusions: . sodium chloride 75 mL/hr at 07/02/17 0800  . sodium chloride     PRN Meds: sodium chloride, acetaminophen, nitroGLYCERIN, ondansetron (ZOFRAN) IV, perflutren lipid microspheres (DEFINITY) IV suspension, sodium chloride flush   Vital Signs    Vitals:   07/02/17 0008 07/02/17 0011 07/02/17 0444 07/02/17 0822  BP: (!) 138/92 (!) 138/92 113/74 134/83  Pulse:    (!) 51  Resp: (!) 21 18 18 14   Temp: 98.2 F (36.8 C) 98.2 F (36.8 C) 98.1 F (36.7 C) 98.4 F (36.9 C)  TempSrc: Oral Oral Oral Oral  SpO2: 99% 99% 97% 98%  Weight:      Height:        Intake/Output Summary (Last 24 hours) at 07/02/17 1110 Last data filed at 07/02/17 0800  Gross per 24 hour  Intake             2100 ml  Output             1650 ml  Net              450 ml   Filed Weights   06/30/17 2350 07/01/17 0700  Weight: 279 lb 1.6 oz (126.6 kg) 286 lb 9.6 oz (130 kg)    Telemetry    Normal sinus rhythm without recurrent atrial fibrillation. - Personally Reviewed  ECG    Sinus bradycardia at 52 bpm. No ischemic or acute ST-T wave changes. - Personally Reviewed  Physical Exam  Obese, sitting at bedside, no distress. GEN: No acute distress.   Neck: No JVD Cardiac: RRR, no murmurs, rubs, or gallops. Radial cath site is unremarkable. Respiratory: Clear to auscultation bilaterally. GI: Soft, nontender, non-distended  MS: No edema; No deformity. Neuro:  Nonfocal  Psych: Normal  affect   Labs    Chemistry Recent Labs Lab 07/01/17 0145  NA 138  K 3.9  CL 104  CO2 26  GLUCOSE 111*  BUN 19  CREATININE 1.10  CALCIUM 9.2  PROT 6.7  ALBUMIN 3.8  AST 34  ALT 18  ALKPHOS 67  BILITOT 0.8  GFRNONAA >60  GFRAA >60  ANIONGAP 8     Hematology Recent Labs Lab 07/01/17 0145 07/02/17 0248  WBC 7.7 5.8  RBC 4.41 4.25  HGB 13.9 13.3  HCT 40.6 39.7  MCV 92.1 93.4  MCH 31.5 31.3  MCHC 34.2 33.5  RDW 12.9 13.1  PLT 179 164    Cardiac Enzymes Recent Labs Lab 07/01/17 0145 07/01/17 0737 07/01/17 1256  TROPONINI 2.83* 2.07* 1.81*   No results for input(s): TROPIPOC in the last 168 hours.   BNP Recent Labs Lab 07/01/17 0145  BNP 174.7*     DDimer No results for input(s): DDIMER in the last 168 hours.   Radiology    No results found.  Cardiac Studies   Cardiac catheterization 07/01/17: Conclusion     LV end diastolic pressure is moderately elevated.  LVEDP 21 mm Hg.  There is no aortic valve stenosis.  No angiographically apparent coronary artery disease.   Continue medical therapy for atrial fibrillation.  Assess LVEF with echocardiogram.    Echocardiography 06/2017: Study Conclusions  - Left ventricle: The cavity size was severely dilated. Wall   thickness was normal. Systolic function was severely reduced. The   estimated ejection fraction was in the range of 25% to 30%.   Diffuse hypokinesis. Doppler parameters are consistent with   abnormal left ventricular relaxation (grade 1 diastolic   dysfunction). - Left atrium: The atrium was moderately dilated.  Impressions:  - Definity used; evere global reduction in LV systolic function;   severe LVE; mild diastolic dysfunction; moderate LAE.  Patient Profile     66 y.o. male paroxysmal atrial fibrillation with spontaneous conversion but with significant elevation in troponin levels. Underwent coronary angiography given risk factors for CAD but was found to have no  obstructive disease.  Assessment & Plan    1. Paroxysmal atrial fibrillation with CHADS VASC< than 2. Anticoagulation therapy started. Awaiting LV assessment by echo. 2. Elevated troponin secondary to demand ischemia. 3. Essential hypertension 4. Acute systolic heart failure versus chronic systolic heart failure. Volume status has improved and he will now be started on guideline directed therapy for systolic dysfunction which will include an ARB and beta blocker therapy.  Plan clinical follow-up is OP. Will need a 30 day monitor to assess for A. fib burden. With decreased LV systolic function, will institute ARB prior to discharge, which we will push back until tomorrow a.m.Selinda Eon, MD  07/02/2017, 11:10 AM

## 2017-07-02 NOTE — Discharge Instructions (Signed)
Information on my medicine - ELIQUIS® (apixaban) ° °This medication education was reviewed with me or my healthcare representative as part of my discharge preparation.  The pharmacist that spoke with me during my hospital stay was:  Kamera Dubas Kay, RPH ° °Why was Eliquis® prescribed for you? °Eliquis® was prescribed for you to reduce the risk of forming blood clots that can cause a stroke if you have a medical condition called atrial fibrillation (a type of irregular heartbeat) OR to reduce the risk of a blood clots forming after orthopedic surgery. ° °What do You need to know about Eliquis® ? °Take your Eliquis® TWICE DAILY - one tablet in the morning and one tablet in the evening with or without food.  It would be best to take the doses about the same time each day. ° °If you have difficulty swallowing the tablet whole please discuss with your pharmacist how to take the medication safely. ° °Take Eliquis® exactly as prescribed by your doctor and DO NOT stop taking Eliquis® without talking to the doctor who prescribed the medication.  Stopping may increase your risk of developing a new clot or stroke.  Refill your prescription before you run out. ° °After discharge, you should have regular check-up appointments with your healthcare provider that is prescribing your Eliquis®.  In the future your dose may need to be changed if your kidney function or weight changes by a significant amount or as you get older. ° °What do you do if you miss a dose? °If you miss a dose, take it as soon as you remember on the same day and resume taking twice daily.  Do not take more than one dose of ELIQUIS at the same time. ° °Important Safety Information °A possible side effect of Eliquis® is bleeding. You should call your healthcare provider right away if you experience any of the following: °? Bleeding from an injury or your nose that does not stop. °? Unusual colored urine (red or dark brown) or unusual colored stools (red or  black). °? Unusual bruising for unknown reasons. °? A serious fall or if you hit your head (even if there is no bleeding). ° °Some medicines may interact with Eliquis® and might increase your risk of bleeding or clotting while on Eliquis®. To help avoid this, consult your healthcare provider or pharmacist prior to using any new prescription or non-prescription medications, including herbals, vitamins, non-steroidal anti-inflammatory drugs (NSAIDs) and supplements. ° °This website has more information on Eliquis® (apixaban): www.Eliquis.com. ° ° °

## 2017-07-02 NOTE — Telephone Encounter (Signed)
Pt is being d/c today, Coralee [place] placed orders for 30 day event

## 2017-07-03 LAB — BASIC METABOLIC PANEL
Anion gap: 8 (ref 5–15)
BUN: 10 mg/dL (ref 6–20)
CHLORIDE: 105 mmol/L (ref 101–111)
CO2: 27 mmol/L (ref 22–32)
CREATININE: 0.8 mg/dL (ref 0.61–1.24)
Calcium: 9.1 mg/dL (ref 8.9–10.3)
GFR calc Af Amer: >60 mL/min (ref >60)
GFR calc non Af Amer: >60 mL/min (ref >60)
GLUCOSE: 99 mg/dL (ref 65–99)
POTASSIUM: 4.3 mmol/L (ref 3.5–5.1)
Sodium: 140 mmol/L (ref 135–145)

## 2017-07-03 LAB — CBC
HCT: 39.4 % (ref 39.0–52.0)
HEMOGLOBIN: 13.3 g/dL (ref 13.0–17.0)
MCH: 31 pg (ref 26.0–34.0)
MCHC: 33.8 g/dL (ref 30.0–36.0)
MCV: 91.8 fL (ref 78.0–100.0)
Platelets: 163 10*3/uL (ref 150–400)
RBC: 4.29 MIL/uL (ref 4.22–5.81)
RDW: 12.7 % (ref 11.5–15.5)
WBC: 6.1 10*3/uL (ref 4.0–10.5)

## 2017-07-03 MED ORDER — CARVEDILOL 3.125 MG PO TABS: 3.1250 mg | ORAL_TABLET | Freq: Two times a day (BID) | ORAL | 5 refills | Status: DC

## 2017-07-03 MED ORDER — APIXABAN 5 MG PO TABS: 5.0000 mg | ORAL_TABLET | Freq: Two times a day (BID) | ORAL | 0 refills | Status: DC

## 2017-07-03 MED ORDER — ATORVASTATIN CALCIUM 40 MG PO TABS: 40.0000 mg | ORAL_TABLET | Freq: Every day | ORAL | 11 refills | Status: DC

## 2017-07-03 MED ORDER — LOSARTAN POTASSIUM 25 MG PO TABS: 25.0000 mg | ORAL_TABLET | Freq: Every day | ORAL | 5 refills | Status: DC

## 2017-07-03 MED ORDER — ASPIRIN 81 MG PO TBEC: 81.0000 mg | DELAYED_RELEASE_TABLET | Freq: Every day | ORAL | 11 refills | Status: DC

## 2017-07-03 NOTE — Progress Notes (Signed)
Progress Note  Patient Name: Craig Robinson Date of Encounter: 07/03/2017  Primary Cardiologist: Mendel Ryder  Subjective   Asymptomatic. Discussed clinical diagnosis, indication for anticoagulation, an indication for other medications initiated during this hospital visit. We discussed heart failure and atrial fibrillation.  Inpatient Medications    Scheduled Meds: . apixaban  5 mg Oral BID  . aspirin EC  81 mg Oral Daily  . atorvastatin  40 mg Oral Daily  . carvedilol  3.125 mg Oral BID WC  . losartan  25 mg Oral Daily  . sodium chloride flush  3 mL Intravenous Q12H   Continuous Infusions: . sodium chloride Stopped (07/02/17 1130)  . sodium chloride     PRN Meds: sodium chloride, acetaminophen, nitroGLYCERIN, ondansetron (ZOFRAN) IV, sodium chloride flush   Vital Signs    Vitals:   07/02/17 1947 07/02/17 2357 07/03/17 0406 07/03/17 0748  BP: 119/68 122/76 131/78 128/80  Pulse: (!) 50   (!) 48  Resp: 13 15 13 14   Temp: 98 F (36.7 C) 98.3 F (36.8 C) 98.1 F (36.7 C) 98 F (36.7 C)  TempSrc: Oral Oral Oral Oral  SpO2: 100%     Weight:      Height:        Intake/Output Summary (Last 24 hours) at 07/03/17 0821 Last data filed at 07/02/17 1800  Gross per 24 hour  Intake              720 ml  Output                0 ml  Net              720 ml   Filed Weights   06/30/17 2350 07/01/17 0700  Weight: 279 lb 1.6 oz (126.6 kg) 286 lb 9.6 oz (130 kg)    Telemetry    Sinus rhythm and sinus bradycardia - Personally Reviewed  ECG    No new tracings - Personally Reviewed  Physical Exam  Obese GEN: No acute distress.   Neck: No JVD Cardiac: RRR, no murmurs, rubs, or gallops.  Respiratory: Clear to auscultation bilaterally. GI: Soft, nontender, non-distended  MS: No edema; No deformity. Neuro:  Nonfocal  Psych: Normal affect   Labs    Chemistry Recent Labs Lab 07/01/17 0145 07/03/17 0343  NA 138 140  K 3.9 4.3  CL 104 105  CO2 26 27  GLUCOSE  111* 99  BUN 19 10  CREATININE 1.10 0.80  CALCIUM 9.2 9.1  PROT 6.7  --   ALBUMIN 3.8  --   AST 34  --   ALT 18  --   ALKPHOS 67  --   BILITOT 0.8  --   GFRNONAA >60 >60  GFRAA >60 >60  ANIONGAP 8 8     Hematology Recent Labs Lab 07/01/17 0145 07/02/17 0248 07/03/17 0343  WBC 7.7 5.8 6.1  RBC 4.41 4.25 4.29  HGB 13.9 13.3 13.3  HCT 40.6 39.7 39.4  MCV 92.1 93.4 91.8  MCH 31.5 31.3 31.0  MCHC 34.2 33.5 33.8  RDW 12.9 13.1 12.7  PLT 179 164 163    Cardiac Enzymes Recent Labs Lab 07/01/17 0145 07/01/17 0737 07/01/17 1256  TROPONINI 2.83* 2.07* 1.81*   No results for input(s): TROPIPOC in the last 168 hours.   BNP Recent Labs Lab 07/01/17 0145  BNP 174.7*     DDimer No results for input(s): DDIMER in the last 168 hours.   Radiology  No results found.  Cardiac Studies   Echocardiogram 07/02/2017: Study Conclusions  - Left ventricle: The cavity size was severely dilated. Wall   thickness was normal. Systolic function was severely reduced. The   estimated ejection fraction was in the range of 25% to 30%.   Diffuse hypokinesis. Doppler parameters are consistent with   abnormal left ventricular relaxation (grade 1 diastolic   dysfunction). - Left atrium: The atrium was moderately dilated.  Impressions:  - Definity used; evere global reduction in LV systolic function;   severe LVE; mild diastolic dysfunction; moderate LAE.  Patient Profile     66 y.o. male with PMH of HTN who presented to [facility] with dizziness, weakness and nausea after mowing yards. Found to have new onset Afib RVR, converted to SR there. Troponin elevation of 0.07>>0.21 and was transferred to [facility] for further work up. Normal coronaries by angiography. Left ventricular systolic dysfunction identified by echo, possibly tachycardia induced versus chronic secondary to hypertension.  Assessment & Plan    1. Paroxysmal atrial fibrillation with spontaneous conversion. Baseline  moderate sinus bradycardia on low-dose beta blocker therapy. 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 Thirty-day monitoring needs to be performed to identify burden of atrial fibrillation and also baseline heart rate tendencies. 2. New acute on chronic systolic heart failure with EF less than 30%. Hopefully this was tachycardia induced, in which case it would be a higher likelihood of recovery. Being discharged on low-dose ARB and beta blocker therapy which should be titrated as an outpatient to optimize possibility of recovery. 3. Anticoagulation therapy will need to be in definite given CHADS VASC >2. He is unable to afford NOAC therapy and therefore will need to be transitioned to Coumadin after one month of Eliquis. Case management will provide one-month supply.   He is ready for discharge.  Signed, Lesleigh Noe, MD  07/03/2017, 8:21 AM

## 2017-07-03 NOTE — Progress Notes (Signed)
[id number] Pt stated he has walked already. Gave pt CHF booklet and low sodium diets due to low EF. Discussed with pt CHF signs and symptoms and when to call MD. Discussed daily weights, 2000 mg sodium restriction and 2L FR. Pt voiced understanding. Luetta Nutting RN BSN 07/03/2017 12:12 PM

## 2017-07-03 NOTE — Progress Notes (Signed)
Discharge note. Patient educated by RN on medications and when to take them, side effects of new medications, signs and symptoms to watch for, when to call the MD, when to seek medical attention, and follow-up appointments. Peripheral IV removed without complications.   Patient discharged by wheelchair with RN.

## 2017-07-08 NOTE — Telephone Encounter (Signed)
Event monitor placed in [employer]

## 2017-07-11 ENCOUNTER — Telehealth: Payer: Self-pay | Admitting: Physician Assistant

## 2017-07-11 NOTE — Telephone Encounter (Signed)
I got pt's wifes cell number from pc and called [employer] and gave them number [phone]  Linkon Chesbro plus his sister delma [phone]

## 2017-07-11 NOTE — Telephone Encounter (Signed)
RECEIVED PHONE CALL FROM [employer], THEY'RE UNABLE TO CONTACT PT W/ TELEPHONE # THAT IS ON FILE.

## 2017-07-15 DIAGNOSIS — I428 Other cardiomyopathies: Secondary | ICD-10-CM | POA: Insufficient documentation

## 2017-07-15 NOTE — Progress Notes (Signed)
Cardiology Office Note    Date:  07/16/2017   ID:  Craig Robinson, DOB Feb 05, 1951, MRN [id number]  PCP:  Craig Peri, MD  Cardiologist: Dr. Verdis Robinson  No chief complaint on file.   History of Present Illness:  Craig Robinson is a 66 y.o. male 66 y.o. male with PMH of HTN who presented to [facility] with dizziness, weakness and nausea after mowing yards. Found to have new onset Afib RVR(CHADSVASC=2) unable to afford NOAC so to be transitioned to Coumadin after 1 month of Eliquis, converted to NSR there, Baseline moderate sinus bradycardia low-dose beta blocker. May have tachycardia bradycardia syndrome and 30 day monitor placed Troponin elevation of 0.07>>0.21 and was transferred to [facility] for further work up. Normal coronaries by angiography. Left ventricular systolic dysfunction on echo EF 25-30%, possibly tachycardia induced versus chronic secondary to hypertension. Was discharged on low-dose ARB and beta blocker to be titrated up as outpatient.  Patient comes in today for follow-up. 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 He denies chest pain, palpitations, dyspnea, dyspnea on exertion, dizziness or presyncope. He is weighing himself daily. Unfortunately he is been eating a lot of hot dogs and barbecue. The monitor has not been placed yet. He is hoping he can stay on Eliquis. BP high today.       Past Medical History:  Diagnosis Date  . Asthma   . Hypertension     Past Surgical History:  Procedure Laterality Date  . LEFT HEART CATH AND CORONARY ANGIOGRAPHY N/A 07/01/2017   Procedure: LEFT HEART CATH AND CORONARY ANGIOGRAPHY;  Surgeon: Craig Crafts, MD;  Location: [facility] INVASIVE CV LAB;  Service: Cardiovascular;  Laterality: N/A;    Current Medications: Current Meds  Medication Sig  . apixaban (ELIQUIS) 5 MG TABS tablet Take 1 tablet (5 mg total) by mouth 2 (two) times daily.  Marland Kitchen aspirin 81 MG EC tablet Take 1 tablet (81 mg  total) by mouth daily.  Marland Kitchen atorvastatin (LIPITOR) 40 MG tablet Take 1 tablet (40 mg total) by mouth daily.  . carvedilol (COREG) 3.125 MG tablet Take 1 tablet (3.125 mg total) by mouth 2 (two) times daily with a meal.  . losartan (COZAAR) 25 MG tablet Take 1 tablet (25 mg total) by mouth daily.     Allergies:   Patient has no known allergies.   Social History   Social History  . Marital status: Single    Spouse name: N/A  . Number of children: N/A  . Years of education: N/A   Social History Main Topics  . Smoking status: Never Smoker  . Smokeless tobacco: Never Used  . Alcohol use No  . Drug use: No  . Sexual activity: Not Currently    Partners: Female   Other Topics Concern  . None   Social History Narrative  . None     Family History:  The patient's family history includes Alzheimer's disease in his mother; Cancer in his father.   ROS:   Please see the history of present illness.    Review of Systems  Constitution: Negative.  HENT: Negative.   Cardiovascular: Negative.   Respiratory: Negative.   Endocrine: Negative.   Hematologic/Lymphatic: Negative.   Musculoskeletal: Negative.   Gastrointestinal: Negative.   Genitourinary: Negative.   Neurological: Negative.    All other systems reviewed and are negative.   PHYSICAL EXAM:   VS:  BP (!) 142/90   Pulse (!) 56   Ht   (1.854 m)   Wt 286 lb (129.7 kg)   SpO2 96%   BMI 37.73 kg/m   Physical Exam  GEN: Obese, in no acute distress  Neck: no JVD, carotid bruits, or masses Cardiac:RRR; no murmurs, rubs, or gallops  Respiratory:  clear to auscultation bilaterally, normal work of breathing GI: soft, nontender, nondistended, + BS Ext: without cyanosis, clubbing, or edema, Good distal pulses bilaterally Neuro:  Alert and Oriented x 3 Psych: euthymic Robinson, full affect  Wt Readings from Last 3 Encounters:  07/16/17 286 lb (129.7 kg)  07/01/17 286 lb 9.6 oz (130 kg)      Studies/Labs Reviewed:   EKG:   EKG is  ordered today.  The ekg ordered today demonstrates sinus bradycardia 57 bpm  Recent Labs: 07/01/2017: ALT 18; B Natriuretic Peptide 174.7; TSH 2.016 07/03/2017: BUN 10; Creatinine, Ser 0.80; Hemoglobin 13.3; Platelets 163; Potassium 4.3; Sodium 140   Lipid Panel No results found for: CHOL, TRIG, HDL, CHOLHDL, VLDL, LDLCALC, LDLDIRECT  Additional studies/ records that were reviewed today include:  Cardiac catheterization 07/01/17: Conclusion       LV end diastolic pressure is moderately elevated. LVEDP 21 mm Hg.  There is no aortic valve stenosis.  No angiographically apparent coronary artery disease.   Continue medical therapy for atrial fibrillation.  Assess LVEF with echocardiogram.     Echocardiogram 07/02/2017: Study Conclusions   - Left ventricle: The cavity size was severely dilated. Wall   thickness was normal. Systolic function was severely reduced. The   estimated ejection fraction was in the range of 25% to 30%.   Diffuse hypokinesis. Doppler parameters are consistent with   abnormal left ventricular relaxation (grade 1 diastolic   dysfunction). - Left atrium: The atrium was moderately dilated.   Impressions:   - Definity used; evere global reduction in LV systolic function;   severe LVE; mild diastolic dysfunction; moderate LAE.     ASSESSMENT:    1. Nonischemic cardiomyopathy (HCC)   2. Atrial fibrillation with RVR (HCC)   3. Acute on chronic systolic heart failure (HCC)   4. Essential hypertension      PLAN:  In order of problems listed above:  Nonischemic cardiomyopathy ejection fraction 25-30% on 2-D echo 07/02/17, normal coronary arteries at cath 07/01/17. Cardiomyopathy felt to be tachycardia induced but also could be hypertensive. Will need repeat echo in the future.Place monitor on him today. Instructions on 2 g sodium diet given. No evidence of heart failure today. Follow-up with Dr. Wyline Robinson in our [place] office.  Atrial fibrillation with RVR  converted to normal sinus rhythm spontaneously. Placing a 30 day monitor to rule out bradycardia tachycardia syndrome. Received Eliquis for 1 month with plans to transition to Coumadin for affordability, but patient thinks he might be able to afford Eliquis.Will give him samples while he works with [employer].  Chronic Systolic heart failure ejection fraction 25-30% patient is eating a lot of high sodium foods but fortunately is not in heart failure today. Will increase losartan. Can't increase Coreg because of bradycardia.  Essential hypertension blood pressure elevated we'll increase losartan    Medication Adjustments/Labs and Tests Ordered: Current medicines are reviewed at length with the patient today.  Concerns regarding medicines are outlined above.  Medication changes, Labs and Tests ordered today are listed in the Patient Instructions below. There are no Patient Instructions on file for this visit.   Elson Clan, PA-C  07/16/2017 12:58 PM    [facility]  [address], [place], [place]  [address] Phone: [phone]; Fax: [phone]

## 2017-07-16 ENCOUNTER — Ambulatory Visit (INDEPENDENT_AMBULATORY_CARE_PROVIDER_SITE_OTHER): Payer: Medicare PPO | Admitting: Physician Assistant

## 2017-07-16 ENCOUNTER — Ambulatory Visit (INDEPENDENT_AMBULATORY_CARE_PROVIDER_SITE_OTHER): Payer: Medicare PPO

## 2017-07-16 ENCOUNTER — Encounter: Payer: Self-pay | Admitting: Physician Assistant

## 2017-07-16 VITALS — BP 142/90 | HR 56 | Ht 73.0 in | Wt 286.0 lb

## 2017-07-16 DIAGNOSIS — I428 Other cardiomyopathies: Secondary | ICD-10-CM

## 2017-07-16 DIAGNOSIS — I10 Essential (primary) hypertension: Secondary | ICD-10-CM | POA: Diagnosis not present

## 2017-07-16 DIAGNOSIS — I4891 Unspecified atrial fibrillation: Secondary | ICD-10-CM

## 2017-07-16 DIAGNOSIS — I480 Paroxysmal atrial fibrillation: Secondary | ICD-10-CM | POA: Diagnosis not present

## 2017-07-16 DIAGNOSIS — I5023 Acute on chronic systolic (congestive) heart failure: Secondary | ICD-10-CM | POA: Diagnosis not present

## 2017-07-16 MED ORDER — LOSARTAN POTASSIUM 50 MG PO TABS: 50.0000 mg | ORAL_TABLET | Freq: Every day | ORAL | 3 refills | Status: DC

## 2017-07-16 NOTE — Patient Instructions (Signed)
Medication Instructions:  Your physician recommends that you continue on your current medications as directed. Please refer to the Current Medication list given to you today.   Labwork: Your physician recommends that you return for lab work in: Today    Testing/Procedures: Your physician has recommended that you wear an event monitor. Event monitors are medical devices that record the heart's electrical activity. Doctors most often [place] these monitors to diagnose arrhythmias. Arrhythmias are problems with the speed or rhythm of the heartbeat. The monitor is a small, portable device. You can wear one while you do your normal daily activities. This is usually used to diagnose what is causing palpitations/syncope (passing out).    Follow-Up: Your physician recommends that you schedule a follow-up appointment in: 1 Month with Dr. Wyline Mood    Any Other Special Instructions Will Be Listed Below (If Applicable). You have been given a copy of a low sodium diet.    If you need a refill on your cardiac medications before your next appointment, please call your pharmacy.  Thank you for choosing [facility]!

## 2017-07-17 ENCOUNTER — Other Ambulatory Visit (HOSPITAL_COMMUNITY)
Admission: RE | Admit: 2017-07-17 | Discharge: 2017-07-17 | Disposition: A | Discharge status: Home / Self Care | Payer: Medicare PPO | Source: Ambulatory Visit | Attending: Physician Assistant | Admitting: Physician Assistant

## 2017-07-17 DIAGNOSIS — I5023 Acute on chronic systolic (congestive) heart failure: Secondary | ICD-10-CM | POA: Diagnosis present

## 2017-07-17 LAB — BASIC METABOLIC PANEL
Anion gap: 8 (ref 5–15)
BUN: 12 mg/dL (ref 6–20)
CHLORIDE: 104 mmol/L (ref 101–111)
CO2: 26 mmol/L (ref 22–32)
CREATININE: 0.73 mg/dL (ref 0.61–1.24)
Calcium: 9.4 mg/dL (ref 8.9–10.3)
GFR calc Af Amer: >60 mL/min (ref >60)
GFR calc non Af Amer: >60 mL/min (ref >60)
Glucose, Bld: 87 mg/dL (ref 65–99)
Potassium: 4 mmol/L (ref 3.5–5.1)
Sodium: 138 mmol/L (ref 135–145)

## 2017-07-24 ENCOUNTER — Telehealth: Payer: Self-pay | Admitting: Cardiology

## 2017-07-24 NOTE — Telephone Encounter (Signed)
    Call this evening by the monitor service.  The patient reportedly had 18 beats of ventricular tachycardia.  I am unable to review these strips.  I did review his notes.  I called him and he has had no symptoms.  I will send this note to Dr. Wyline Mood.  I will ask that our office contact him.  I also asked the patient to call the office tomorrow to discuss this.

## 2017-07-25 ENCOUNTER — Telehealth: Payer: Self-pay

## 2017-07-25 ENCOUNTER — Other Ambulatory Visit (HOSPITAL_COMMUNITY)
Admission: RE | Admit: 2017-07-25 | Discharge: 2017-07-25 | Disposition: A | Discharge status: Home / Self Care | Payer: Medicare PPO | Source: Ambulatory Visit | Attending: Cardiovascular Disease | Admitting: Cardiovascular Disease

## 2017-07-25 ENCOUNTER — Other Ambulatory Visit: Payer: Self-pay

## 2017-07-25 DIAGNOSIS — I4729 Other ventricular tachycardia: Secondary | ICD-10-CM

## 2017-07-25 DIAGNOSIS — I5023 Acute on chronic systolic (congestive) heart failure: Secondary | ICD-10-CM | POA: Diagnosis not present

## 2017-07-25 DIAGNOSIS — I472 Ventricular tachycardia: Secondary | ICD-10-CM | POA: Insufficient documentation

## 2017-07-25 LAB — BASIC METABOLIC PANEL
ANION GAP: 6 (ref 5–15)
BUN: 20 mg/dL (ref 6–20)
CO2: 27 mmol/L (ref 22–32)
Calcium: 9.1 mg/dL (ref 8.9–10.3)
Chloride: 102 mmol/L (ref 101–111)
Creatinine, Ser: 0.78 mg/dL (ref 0.61–1.24)
Glucose, Bld: 110 mg/dL — ABNORMAL HIGH (ref 65–99)
Potassium: 4.1 mmol/L (ref 3.5–5.1)
SODIUM: 135 mmol/L (ref 135–145)

## 2017-07-25 LAB — TSH: TSH: 1.489 u[IU]/mL (ref 0.350–4.500)

## 2017-07-25 LAB — MAGNESIUM: MAGNESIUM: 1.8 mg/dL (ref 1.7–2.4)

## 2017-07-25 MED ORDER — CARVEDILOL 6.25 MG PO TABS: 6.2500 mg | ORAL_TABLET | Freq: Two times a day (BID) | ORAL | 3 refills | Status: DC

## 2017-07-25 NOTE — Telephone Encounter (Signed)
Called pt. Let him know he will need lab work done today. He voiced understanding. Pt will be scheduled with Dr. Ladona Ridgel on Monday.

## 2017-07-25 NOTE — Telephone Encounter (Signed)
-----   Message from Antoine Poche, MD sent at 07/25/2017  8:37 AM EDT ----- Please let patient know I reviewed a recent transmission from his heart monitor. He did have short episode of an abnormal rhythm called nonsustatined ventricular tachycardia. This was likely caused because of his weakened heart. He needs to get repeat labs today with a BMET/Mg/TSH, please increase coreg to 6.25mg  bid, and refer to EP for first available appointment. Needs nursing visit Monday to check his bp and heart rate with med change   Dina Rich MD

## 2017-07-28 ENCOUNTER — Encounter: Payer: Self-pay | Admitting: Internal Medicine

## 2017-07-28 ENCOUNTER — Ambulatory Visit (INDEPENDENT_AMBULATORY_CARE_PROVIDER_SITE_OTHER): Payer: Medicare PPO | Admitting: Internal Medicine

## 2017-07-28 VITALS — BP 128/76 | HR 56 | Ht 73.0 in | Wt 279.0 lb

## 2017-07-28 DIAGNOSIS — I428 Other cardiomyopathies: Secondary | ICD-10-CM | POA: Diagnosis not present

## 2017-07-28 DIAGNOSIS — I10 Essential (primary) hypertension: Secondary | ICD-10-CM | POA: Diagnosis not present

## 2017-07-28 DIAGNOSIS — I4891 Unspecified atrial fibrillation: Secondary | ICD-10-CM

## 2017-07-28 DIAGNOSIS — I5023 Acute on chronic systolic (congestive) heart failure: Secondary | ICD-10-CM

## 2017-07-28 NOTE — Progress Notes (Signed)
HPI [name] is referred today by Dr. branch for evaluation of paroxysmal atrial fibrillation in the setting of a nonischemic cardiomyopathy, hypertension, and obesity. The patient was hospitalized several weeks ago with atrial fibrillation and acute systolic heart failure. He underwent cardiac catheterization demonstrating no coronary artery disease. He was placed on medical therapy with a beta blocker and an ARB drug and feels improved. He has class IIa heart failure symptoms. He was noted to be in atrial fibrillation, but is now reverted back to sinus rhythm. He is wearing a heart monitor and has minimal palpitations. His heart monitor results are still not available. He has never had syncope. There is no family history of sudden death or congestive heart failure. He denies snoring or other symptoms of sleep apnea. No Known Allergies   Current Outpatient Prescriptions  Medication Sig Dispense Refill  . apixaban (ELIQUIS) 5 MG TABS tablet Take 1 tablet (5 mg total) by mouth 2 (two) times daily. 60 tablet 0  . aspirin 81 MG EC tablet Take 1 tablet (81 mg total) by mouth daily. 30 tablet 11  . atorvastatin (LIPITOR) 40 MG tablet Take 1 tablet (40 mg total) by mouth daily. 30 tablet 11  . carvedilol (COREG) 6.25 MG tablet Take 1 tablet (6.25 mg total) by mouth 2 (two) times daily. 180 tablet 3  . losartan (COZAAR) 50 MG tablet Take 1 tablet (50 mg total) by mouth daily. 90 tablet 3   No current facility-administered medications for this visit.      Past Medical History:  Diagnosis Date  . Asthma   . Hypertension     ROS:   All systems reviewed and negative except as noted in the HPI.   Past Surgical History:  Procedure Laterality Date  . LEFT HEART CATH AND CORONARY ANGIOGRAPHY N/A 07/01/2017   Procedure: LEFT HEART CATH AND CORONARY ANGIOGRAPHY;  Surgeon: Corky Crafts, MD;  Location: [facility] INVASIVE CV LAB;  Service: Cardiovascular;  Laterality: N/A;     Family  History  Problem Relation Age of Onset  . Alzheimer's disease Mother   . Cancer Father      Social History   Social History  . Marital status: Single    Spouse name: N/A  . Number of children: N/A  . Years of education: N/A   Occupational History  . Not on file.   Social History Main Topics  . Smoking status: Never Smoker  . Smokeless tobacco: Never Used  . Alcohol use No  . Drug use: No  . Sexual activity: Not Currently    Partners: Female   Other Topics Concern  . Not on file   Social History Narrative  . No narrative on file     BP 128/76   Pulse (!) 56   Ht  (1.854 m)   Wt 279 lb (126.6 kg)   SpO2 96%   BMI 36.81 kg/m   Physical Exam:  Well appearing 66 year old man, overweight, but in NAD HEENT: Unremarkable Neck:  7 cm JVD, no thyromegally Lymphatics:  No adenopathy Back:  No CVA tenderness Lungs:  Clear, with no wheezes, rales, or rhonchi HEART:  Regular rate rhythm, no murmurs, no rubs, no clicks Abd:  soft, positive bowel sounds, no organomegally, no rebound, no guarding Ext:  2 plus pulses, no edema, no cyanosis, no clubbing Skin:  No rashes no nodules Neuro:  CN II through XII intact, motor grossly intact   Assess/Plan: 1. Chronic systolic heart  failure - he has been placed on medical therapy and his symptoms have improved. It is unclear to me as to whether or not his heart failure is caused by atrial fibrillation. I suspect it is not. I would anticipate up titration of medical therapy, and consider Aldactone initiation but will defer this to Dr. Wyline Mood. He is encouraged to lose weight and maintain a low-sodium diet. 2. Paroxysmal atrial fibrillation - he is maintaining sinus rhythm on no antiarrhythmic drug therapy. He will continue oral anticoagulation therapy. I will review his cardiac monitor to determine whether or not additional antiarrhythmic drug therapy is warranted.  3. Obesity - I've encouraged the patient to lose weight. Despite  his lack of symptoms, consideration for a sleep study is still a consideration.  Lewayne Bunting, M.D.

## 2017-07-28 NOTE — Patient Instructions (Signed)
Medication Instructions:  Your physician recommends that you continue on your current medications as directed. Please refer to the Current Medication list given to you today.   Labwork: None   Testing/Procedures: None  Follow-Up: Your physician wants you to follow-up in: 3 Months with Dr. Ladona Ridgel. You will receive a reminder letter in the mail two months in advance. If you don't receive a letter, please call our office to schedule the follow-up appointment.   Any Other Special Instructions Will Be Listed Below (If Applicable).     If you need a refill on your cardiac medications before your next appointment, please call your pharmacy.  Thank you for choosing [facility]!

## 2017-08-04 ENCOUNTER — Telehealth: Payer: Self-pay | Admitting: *Deleted

## 2017-08-04 NOTE — Telephone Encounter (Signed)
Critical Cardiac report @1 :20 pm from Gabe with [employer]. EKG event shown to Dr. Eden Emms (DOD). Returned Mining engineer. Will scan event into chart.

## 2017-08-09 ENCOUNTER — Telehealth: Payer: Self-pay | Admitting: Internal Medicine

## 2017-08-09 NOTE — Telephone Encounter (Signed)
Call from [employer]  Sinus brady at 49 bpm NSVT for 8 seconds Now sinus brady 55 bpm with occasional PVCs Patient asymptomatic when [employer] called  Berdine Dance, MD

## 2017-08-10 ENCOUNTER — Telehealth: Payer: Self-pay | Admitting: Cardiology

## 2017-08-10 NOTE — Telephone Encounter (Signed)
[facility] called stating that patient at 10 beats of wide complex tachycardia at 173bpm.  The patient was asymptomatic at home exercising.  Patient had recent cath showing normal coronary arteries and 2D echo showed severely reduced LVF at 25-30%.  Monitor strips reviewed which show NSR with short burst of WCT that is slightly irregular.  With history of afib this could be afib with aberration but he also has a history of LV dysfunction.  Will route this to Dr. Ladona Ridgel for review.  Continue to monitor.  Patient asymptomatic.

## 2017-08-14 ENCOUNTER — Telehealth: Payer: Self-pay | Admitting: Cardiology

## 2017-08-14 NOTE — Telephone Encounter (Signed)
Called by [facility] for 10 sec of V Tach at 199.  They will fax to me.  Pt has been weed eating today with no symptoms.  For awhile it was noted to be poor contact.  Will review strips.

## 2017-08-15 ENCOUNTER — Telehealth: Payer: Self-pay

## 2017-08-15 MED ORDER — CARVEDILOL 6.25 MG PO TABS: 9.3750 mg | ORAL_TABLET | Freq: Two times a day (BID) | ORAL | 3 refills | Status: DC

## 2017-08-15 NOTE — Telephone Encounter (Signed)
Patient called back, instructed to increase Coreg to 9.375 mg BID

## 2017-08-15 NOTE — Telephone Encounter (Signed)
LM for patient to call back.E-scribed increased dose of Coreg to [facility]

## 2017-08-15 NOTE — Telephone Encounter (Signed)
10 second run of V-tach noted, faxed strips to Dr.Branch for review. Report faxed back by Dr.branch, no orders given, sent to be scanned

## 2017-08-15 NOTE — Telephone Encounter (Signed)
-----   Message from Antoine Poche, MD sent at 08/15/2017  8:57 AM EDT ----- Patient with episodes of an abnormal rhythm on his monitor (NSVT), please have him increase his coreg to 9.375mg  bid ( that is 1 and 1/2 tablets) bid.   Dina Rich MD

## 2017-08-20 ENCOUNTER — Encounter: Payer: Self-pay | Admitting: Cardiology

## 2017-08-20 ENCOUNTER — Ambulatory Visit (INDEPENDENT_AMBULATORY_CARE_PROVIDER_SITE_OTHER): Payer: Medicare PPO | Admitting: Cardiology

## 2017-08-20 VITALS — BP 132/82 | HR 58 | Ht 73.0 in | Wt 280.4 lb

## 2017-08-20 DIAGNOSIS — I472 Ventricular tachycardia: Secondary | ICD-10-CM

## 2017-08-20 DIAGNOSIS — I4891 Unspecified atrial fibrillation: Secondary | ICD-10-CM

## 2017-08-20 DIAGNOSIS — I5032 Chronic diastolic (congestive) heart failure: Secondary | ICD-10-CM

## 2017-08-20 DIAGNOSIS — I4729 Other ventricular tachycardia: Secondary | ICD-10-CM

## 2017-08-20 MED ORDER — LOSARTAN POTASSIUM 100 MG PO TABS: 100.0000 mg | ORAL_TABLET | Freq: Every day | ORAL | 1 refills | Status: DC

## 2017-08-20 NOTE — Telephone Encounter (Signed)
Reviewed by Dr. Ladona Ridgel.  Per Dr. Ladona Ridgel follow up as scheduled.

## 2017-08-20 NOTE — Patient Instructions (Signed)
Your physician recommends that you schedule a follow-up appointment in: 1 MONTH WITH DR [facility]  Your physician has recommended you make the following change in your medication:   INCREASE LOSARTAN 100 MG DAILY  Your physician recommends that you return for lab work in: 2 WEEKS BMP  NURSE APPOINTMENT IN 2 WEEKS FOR VITALS CHECK  Thank you for choosing [facility]!!

## 2017-08-20 NOTE — Progress Notes (Signed)
Clinical Summary Mr. Kravchenko is a 66 y.o.male seen today for follow up of the following medical problems.     1. Chronic systolic heart failure - 06/2017 echo LVEF 25-30%, diffuse hypokinesis, grade I diastolic dysfunction - 06/2017 cath without obstructive CAD - recently increased coreg, no new side effects  - no recent SOB/DOE/LE edema  2. PAF - no recent palpitations - no bleeding on eliquis.   3. NSVT - noted on recent monitor - coreg increased to 9.375mg  bid - denies any significant palpitations, lightheadedness, or dizziness  Past Medical History:  Diagnosis Date  . Asthma   . Hypertension      No Known Allergies   Current Outpatient Prescriptions  Medication Sig Dispense Refill  . apixaban (ELIQUIS) 5 MG TABS tablet Take 1 tablet (5 mg total) by mouth 2 (two) times daily. 60 tablet 0  . aspirin 81 MG EC tablet Take 1 tablet (81 mg total) by mouth daily. 30 tablet 11  . atorvastatin (LIPITOR) 40 MG tablet Take 1 tablet (40 mg total) by mouth daily. 30 tablet 11  . carvedilol (COREG) 6.25 MG tablet Take 1.5 tablets (9.375 mg total) by mouth 2 (two) times daily. 270 tablet 3  . losartan (COZAAR) 50 MG tablet Take 1 tablet (50 mg total) by mouth daily. 90 tablet 3   No current facility-administered medications for this visit.      Past Surgical History:  Procedure Laterality Date  . LEFT HEART CATH AND CORONARY ANGIOGRAPHY N/A 07/01/2017   Procedure: LEFT HEART CATH AND CORONARY ANGIOGRAPHY;  Surgeon: Corky Crafts, MD;  Location: [facility] INVASIVE CV LAB;  Service: Cardiovascular;  Laterality: N/A;     No Known Allergies    Family History  Problem Relation Age of Onset  . Alzheimer's disease Mother   . Cancer Father      Social History Mr. Suderman reports that he has never smoked. He has never used smokeless tobacco. Mr. Malveaux reports that he does not drink alcohol.   Review of Systems CONSTITUTIONAL: No weight loss, fever, chills,  weakness or fatigue.  HEENT: Eyes: No visual loss, blurred vision, double vision or yellow sclerae.No hearing loss, sneezing, congestion, runny nose or sore throat.  SKIN: No rash or itching.  CARDIOVASCULAR: per hpi RESPIRATORY: No shortness of breath, cough or sputum.  GASTROINTESTINAL: No anorexia, nausea, vomiting or diarrhea. No abdominal pain or blood.  GENITOURINARY: No burning on urination, no polyuria NEUROLOGICAL: No headache, dizziness, syncope, paralysis, ataxia, numbness or tingling in the extremities. No change in bowel or bladder control.  MUSCULOSKELETAL: No muscle, back pain, joint pain or stiffness.  LYMPHATICS: No enlarged nodes. No history of splenectomy.  PSYCHIATRIC: No history of depression or anxiety.  ENDOCRINOLOGIC: No reports of sweating, cold or heat intolerance. No polyuria or polydipsia.  Marland Kitchen   Physical Examination Vitals:   08/20/17 1341  BP: 132/82  Pulse: (!) 58  SpO2: 97%   Vitals:   08/20/17 1341  Weight: 280 lb 6.4 oz (127.2 kg)  Height: 6\' 1"  (1.854 m)    Gen: resting comfortably, no acute distress HEENT: no scleral icterus, pupils equal round and reactive, no palptable cervical adenopathy,  CV: RRR, no m/r/g, no jvd Resp: Clear to auscultation bilaterally GI: abdomen is soft, non-tender, non-distended, normal bowel sounds, no hepatosplenomegaly MSK: extremities are warm, no edema.  Skin: warm, no rash Neuro:  no focal deficits Psych: appropriate affect   Diagnostic Studies  06/2017 cath  LV end diastolic  pressure is moderately elevated. LVEDP 21 mm Hg.  There is no aortic valve stenosis.  No angiographically apparent coronary artery disease.   Continue medical therapy for atrial fibrillation.  Assess LVEF with echocardiogram.   06/2017 echo Study Conclusions  - Left ventricle: The cavity size was severely dilated. Wall   thickness was normal. Systolic function was severely reduced. The   estimated ejection fraction was in the  range of 25% to 30%.   Diffuse hypokinesis. Doppler parameters are consistent with   abnormal left ventricular relaxation (grade 1 diastolic   dysfunction). - Left atrium: The atrium was moderately dilated.  Impressions:  - Definity used; evere global reduction in LV systolic function;   severe LVE; mild diastolic dysfunction; moderate LAE.   06/2017 event monitor  Min HR 33, Max HR 132, Avg HR 62. Low heart rate at 543AM presumably while sleeping  Telemetry tracings show sinus rhtyhm with multiple episodes of wide complex tachycardia, probable NSVT. Longest run 29 beats  No reported symptoms    Assessment and Plan  1. Chronic systolic HF - recent increase in coreg, follow heart rates and bp's. We will increase losartan to 100mg  daily. May change to entresto over time. Check BMET in 2 weeks  2. NSVT - asymptomatic, continue coreg. F/u with EP  3. Afib - no symptoms, continue current meds including anticoag  Nursing visit 2 weeks for vitals check and f/u with me in 1 month    Antoine PocheJonathan F. Dawsyn Ramsaran, M.D.

## 2017-08-25 ENCOUNTER — Other Ambulatory Visit: Payer: Self-pay | Admitting: Cardiology

## 2017-08-26 NOTE — Telephone Encounter (Signed)
This is Dr. Branch's pt 

## 2017-08-27 ENCOUNTER — Telehealth: Payer: Self-pay

## 2017-08-27 ENCOUNTER — Telehealth: Payer: Self-pay | Admitting: Physician Assistant

## 2017-08-27 NOTE — Telephone Encounter (Signed)
Pt requested samples. I gave him 2 weeks worth.  Lot # [id number] Exp dec 2020

## 2017-08-27 NOTE — Telephone Encounter (Signed)
Patient has questions regarding Eliquis / tg

## 2017-08-27 NOTE — Telephone Encounter (Signed)
Returned pt. Call, no answer.Ledt message for pt to return call.

## 2017-09-02 ENCOUNTER — Other Ambulatory Visit (HOSPITAL_COMMUNITY)
Admission: RE | Admit: 2017-09-02 | Discharge: 2017-09-02 | Disposition: A | Discharge status: Home / Self Care | Payer: Medicare PPO | Source: Ambulatory Visit | Attending: Cardiology | Admitting: Cardiology

## 2017-09-02 DIAGNOSIS — I5023 Acute on chronic systolic (congestive) heart failure: Secondary | ICD-10-CM | POA: Insufficient documentation

## 2017-09-02 LAB — BASIC METABOLIC PANEL
Anion gap: 7 (ref 5–15)
BUN: 14 mg/dL (ref 6–20)
CALCIUM: 9.2 mg/dL (ref 8.9–10.3)
CO2: 28 mmol/L (ref 22–32)
CREATININE: 0.83 mg/dL (ref 0.61–1.24)
Chloride: 105 mmol/L (ref 101–111)
GFR calc non Af Amer: >60 mL/min (ref >60)
Glucose, Bld: 103 mg/dL — ABNORMAL HIGH (ref 65–99)
Potassium: 4.1 mmol/L (ref 3.5–5.1)
SODIUM: 140 mmol/L (ref 135–145)

## 2017-09-04 ENCOUNTER — Ambulatory Visit (INDEPENDENT_AMBULATORY_CARE_PROVIDER_SITE_OTHER): Payer: Medicare PPO | Admitting: *Deleted

## 2017-09-04 VITALS — BP 115/68 | HR 45

## 2017-09-04 DIAGNOSIS — I5032 Chronic diastolic (congestive) heart failure: Secondary | ICD-10-CM | POA: Diagnosis not present

## 2017-09-04 NOTE — Progress Notes (Signed)
Patient in office this morning for vitals check after recent increase to his Losartan.  Patient has not taken any medications this morning.  Stated he is feeling fine.

## 2017-09-08 NOTE — Progress Notes (Signed)
BP looked fine at check, heart rate a little bit low. We will recheck and discuss at our upcoming appt  J BrancH MD

## 2017-09-08 NOTE — Progress Notes (Signed)
Pt aware and voiced understanding 

## 2017-09-10 ENCOUNTER — Ambulatory Visit: Payer: Medicare PPO | Admitting: Cardiology

## 2017-09-10 ENCOUNTER — Encounter: Payer: Self-pay | Admitting: Cardiology

## 2017-09-10 ENCOUNTER — Other Ambulatory Visit: Payer: Self-pay

## 2017-09-10 VITALS — BP 130/80 | HR 49 | Ht 73.0 in | Wt 280.0 lb

## 2017-09-10 DIAGNOSIS — I4729 Other ventricular tachycardia: Secondary | ICD-10-CM

## 2017-09-10 DIAGNOSIS — I4891 Unspecified atrial fibrillation: Secondary | ICD-10-CM

## 2017-09-10 DIAGNOSIS — I5022 Chronic systolic (congestive) heart failure: Secondary | ICD-10-CM | POA: Diagnosis not present

## 2017-09-10 DIAGNOSIS — I472 Ventricular tachycardia: Secondary | ICD-10-CM

## 2017-09-10 MED ORDER — APIXABAN 5 MG PO TABS: 5.0000 mg | ORAL_TABLET | Freq: Two times a day (BID) | ORAL | 0 refills | Status: DC

## 2017-09-10 MED ORDER — SPIRONOLACTONE 25 MG PO TABS
25.0000 mg | ORAL_TABLET | Freq: Every day | ORAL | 3 refills | Status: DC
Start: 2017-09-10 — End: 2018-05-25

## 2017-09-10 NOTE — Patient Instructions (Addendum)
Medication Instructions:   Begin Aldactone 25mg  daily.  Continue all other medications.    Labwork:  BMET - order given today.  Due in 2 weeks.  Office will contact with results via phone or letter.    Testing/Procedures: none  Follow-Up: 6 weeks   Any Other Special Instructions Will Be Listed Below (If Applicable). Eliquis samples given today.  If you need a refill on your cardiac medications before your next appointment, please call your pharmacy.

## 2017-09-10 NOTE — Progress Notes (Signed)
Clinical Summary [name] is a 66 y.o.male seen today for follow up of the following medical problems.     1. Chronic systolic heart failure - 06/2017 echo LVEF 25-30%, diffuse hypokinesis, grade I diastolic dysfunction - 06/2017 cath without obstructive CAD - recently increased coreg, no new side effects   - weight is stable since last visit - mild SOB right after taking meds and then resolves - no recent edema. Home weights stable around 278-280. Limiting sodium intake. Avoiding NSAIDs - compliant with meds. No dizziness.    2. PAF - no recent palpitations - no bleeding on eliquis.   - denies any palpitstions - eliquis is expensive. Costing him $300, he reports in January will be $45  3. NSVT - noted on recent monitor - coreg increased to 9.375mg  bid - denies any significant palpitations, lightheadedness, or dizziness  - no recent symptoms  Past Medical History:  Diagnosis Date  . Asthma   . Hypertension      No Known Allergies   Current Outpatient Medications  Medication Sig Dispense Refill  . aspirin 81 MG EC tablet Take 1 tablet (81 mg total) by mouth daily. 30 tablet 11  . atorvastatin (LIPITOR) 40 MG tablet Take 1 tablet (40 mg total) by mouth daily. 30 tablet 11  . carvedilol (COREG) 6.25 MG tablet Take 1.5 tablets (9.375 mg total) by mouth 2 (two) times daily. 270 tablet 3  . ELIQUIS 5 MG TABS tablet TAKE ONE TABLET BY MOUTH TWICE DAILY. 60 tablet 3  . losartan (COZAAR) 100 MG tablet Take 1 tablet (100 mg total) by mouth daily. 90 tablet 1   No current facility-administered medications for this visit.      No past surgical history on file.   No Known Allergies    Family History  Problem Relation Age of Onset  . Alzheimer's disease Mother   . Cancer Father      Social History [name] reports that  has never smoked. he has never used smokeless tobacco. [name] reports that he does not drink alcohol.   Review of  Systems CONSTITUTIONAL: No weight loss, fever, chills, weakness or fatigue.  HEENT: Eyes: No visual loss, blurred vision, double vision or yellow sclerae.No hearing loss, sneezing, congestion, runny nose or sore throat.  SKIN: No rash or itching.  CARDIOVASCULAR: per hpi RESPIRATORY: per hpi GASTROINTESTINAL: No anorexia, nausea, vomiting or diarrhea. No abdominal pain or blood.  GENITOURINARY: No burning on urination, no polyuria NEUROLOGICAL: No headache, dizziness, syncope, paralysis, ataxia, numbness or tingling in the extremities. No change in bowel or bladder control.  MUSCULOSKELETAL: No muscle, back pain, joint pain or stiffness.  LYMPHATICS: No enlarged nodes. No history of splenectomy.  PSYCHIATRIC: No history of depression or anxiety.  ENDOCRINOLOGIC: No reports of sweating, cold or heat intolerance. No polyuria or polydipsia.  Marland Kitchen.   Physical Examination Vitals:   09/10/17 0836  BP: 130/80  Pulse: (!) 49  SpO2: 96%   Vitals:   09/10/17 0836  Weight: 280 lb (127 kg)  Height: 6\' 1"  (1.854 m)    Gen: resting comfortably, no acute distress HEENT: no scleral icterus, pupils equal round and reactive, no palptable cervical adenopathy,  CV: RRR, no m/r/g, no jvd Resp: Clear to auscultation bilaterally GI: abdomen is soft, non-tender, non-distended, normal bowel sounds, no hepatosplenomegaly MSK: extremities are warm, no edema.  Skin: warm, no rash Neuro:  no focal deficits Psych: appropriate affect   Diagnostic Studies 06/2017 cath  LV end diastolic pressure is moderately elevated. LVEDP 21 mm Hg.  There is no aortic valve stenosis.  No angiographically apparent coronary artery disease.  Continue medical therapy for atrial fibrillation. Assess LVEF with echocardiogram.   06/2017 echo Study Conclusions  - Left ventricle: The cavity size was severely dilated. Wall thickness was normal. Systolic function was severely reduced. The estimated ejection fraction  was in the range of 25% to 30%. Diffuse hypokinesis. Doppler parameters are consistent with abnormal left ventricular relaxation (grade 1 diastolic dysfunction). - Left atrium: The atrium was moderately dilated.  Impressions:  - Definity used; evere global reduction in LV systolic function; severe LVE; mild diastolic dysfunction; moderate LAE.   06/2017 event monitor  Min HR 33, Max HR 132, Avg HR 62. Low heart rate at 543AM presumably while sleeping  Telemetry tracings show sinus rhtyhm with multiple episodes of wide complex tachycardia, probable NSVT. Longest run 29 beats  No reported symptoms      Assessment and Plan   1. Chronic systolic HF - no recent symptoms - start aldactone 25mg  daily, check BMET in 2 weeks  2. NSVT - asymptomatic, we will continue beta blocker.   3. PAF - no recent symptoms - continue current meds   F/u 6 weeks  Antoine Poche, M.D.

## 2017-09-13 ENCOUNTER — Encounter: Payer: Self-pay | Admitting: Cardiology

## 2017-09-23 ENCOUNTER — Other Ambulatory Visit: Payer: Self-pay | Admitting: *Deleted

## 2017-09-23 MED ORDER — APIXABAN 5 MG PO TABS: 5.0000 mg | ORAL_TABLET | Freq: Two times a day (BID) | ORAL | 0 refills | Status: DC

## 2017-09-24 ENCOUNTER — Other Ambulatory Visit (HOSPITAL_COMMUNITY)
Admission: RE | Admit: 2017-09-24 | Discharge: 2017-09-24 | Disposition: A | Discharge status: Home / Self Care | Payer: Medicare PPO | Source: Ambulatory Visit | Attending: Cardiology | Admitting: Cardiology

## 2017-09-24 DIAGNOSIS — I10 Essential (primary) hypertension: Secondary | ICD-10-CM | POA: Insufficient documentation

## 2017-09-24 LAB — BASIC METABOLIC PANEL
Anion gap: 5 (ref 5–15)
BUN: 15 mg/dL (ref 6–20)
CO2: 29 mmol/L (ref 22–32)
CREATININE: 0.77 mg/dL (ref 0.61–1.24)
Calcium: 9.5 mg/dL (ref 8.9–10.3)
Chloride: 104 mmol/L (ref 101–111)
GFR calc Af Amer: >60 mL/min (ref >60)
GFR calc non Af Amer: >60 mL/min (ref >60)
GLUCOSE: 102 mg/dL — AB (ref 65–99)
POTASSIUM: 4.7 mmol/L (ref 3.5–5.1)
SODIUM: 138 mmol/L (ref 135–145)

## 2017-09-25 ENCOUNTER — Telehealth: Payer: Self-pay | Admitting: *Deleted

## 2017-09-25 NOTE — Telephone Encounter (Signed)
Pt aware - routed to pcp  

## 2017-09-25 NOTE — Telephone Encounter (Signed)
-----   Message from Antoine Poche, MD sent at 09/25/2017 10:17 AM EST ----- Labs look good  Dominga Ferry MD

## 2017-09-29 ENCOUNTER — Encounter: Payer: Self-pay | Admitting: *Deleted

## 2017-09-29 DIAGNOSIS — Z006 Encounter for examination for normal comparison and control in clinical research program: Secondary | ICD-10-CM

## 2017-09-29 NOTE — Progress Notes (Signed)
Research Telephone Encounter  Subject meets inclusion/exclusion criteria for Galactic Heart Failure Research Study.  Dr. Wyline Mood and Dr. Shirlee Latch aware of potential screening.  Galactic Study discussed with the subject on the phone.  Consent emailed, by request, for subject to review.  All questions/concerns answered.

## 2017-10-10 ENCOUNTER — Encounter: Payer: Medicare PPO | Admitting: *Deleted

## 2017-10-10 VITALS — BP 120/65 | HR 54 | Ht 73.0 in | Wt 275.4 lb

## 2017-10-10 DIAGNOSIS — Z006 Encounter for examination for normal comparison and control in clinical research program: Secondary | ICD-10-CM

## 2017-10-10 NOTE — Progress Notes (Addendum)
GalacticInformed Consent  Subject Name: Craig Robinson  Subject met inclusion and exclusion criteria. The informed consent form, study requirements and expectations were reviewed with the subject and questions and concerns were addressed prior to the signing of the consent form. The subject verbalized understanding of the trail requirements. The subject agreed to participate in the Galactictrial and signed the informed consent. The informed consent was obtained prior to performance of any protocol-specific procedures for the subject. A copy of the signed informed consent was given to the subject.  Research Coordinator will contact subject once labs are received to discuss follow-up.    Chrismon, Maggie L, RN 10/10/2017           

## 2017-10-13 ENCOUNTER — Telehealth: Payer: Self-pay | Admitting: *Deleted

## 2017-10-13 NOTE — Telephone Encounter (Signed)
RESEARCH ENCOUNTER  Patient ID: Craig Robinson  DOB: 07/28/1951  Anselm Jungling does not qualify for Galactic Heart Failure based on protocol 108 exclusion criteria.  Subject's BNP 61.1pg/mL.  Contacted subject by phone to let him know that he does not meet study requirements.  In addition, let subject know that his potassium was borderline high at 5.77mmol/L.  Discussed diet and foods to avoid that are high in potassium.  Subject verbalized understanding.  Message sent to Dr. Wyline Mood.  Subject has appointment with Dr. Wyline Mood.on Dec 28.

## 2017-10-14 ENCOUNTER — Telehealth: Payer: Self-pay | Admitting: Cardiology

## 2017-10-14 NOTE — Telephone Encounter (Signed)
Asking for Eliquis samples  °

## 2017-10-15 MED ORDER — APIXABAN 5 MG PO TABS: 5.0000 mg | ORAL_TABLET | Freq: Two times a day (BID) | ORAL | 0 refills | Status: DC

## 2017-10-15 NOTE — Telephone Encounter (Signed)
Samples ready. Patient notified.

## 2017-10-24 ENCOUNTER — Ambulatory Visit: Payer: Medicare PPO | Admitting: Cardiology

## 2017-10-24 ENCOUNTER — Other Ambulatory Visit: Payer: Self-pay

## 2017-10-24 ENCOUNTER — Encounter: Payer: Self-pay | Admitting: Cardiology

## 2017-10-24 VITALS — BP 116/69 | HR 56 | Ht 73.0 in | Wt 280.0 lb

## 2017-10-24 DIAGNOSIS — I5022 Chronic systolic (congestive) heart failure: Secondary | ICD-10-CM | POA: Diagnosis not present

## 2017-10-24 DIAGNOSIS — I4729 Other ventricular tachycardia: Secondary | ICD-10-CM

## 2017-10-24 DIAGNOSIS — I472 Ventricular tachycardia: Secondary | ICD-10-CM | POA: Diagnosis not present

## 2017-10-24 NOTE — Patient Instructions (Signed)
Your physician recommends that you schedule a follow-up appointment in: 1 MONTH WITH DR BRANCH  Your physician recommends that you continue on your current medications as directed. Please refer to the Current Medication list given to you today.  Your physician recommends that you return for lab work BMP/MG  Thank you for choosing [facility]!!    

## 2017-10-24 NOTE — Progress Notes (Signed)
Clinical Summary Craig Robinson is a 66 y.o.male seen today for follow up of the following medical problems. This is a focused visit on his history of chronic systolic HF, for more detailed history please refer to prior notes.    1. Chronic systolic heart failure - 06/2017 echo LVEF 25-30%, diffuse hypokinesis, grade I diastolic dysfunction - 06/2017 cath without obstructive CAD  - last visit we started aldactone 25mg  daily. As part of CHF study he had labs, I was notified K was up to 5. These labs do not appear to be available on epic.  - no recent SOB/DOE/LE edema - compliant with meds   Past Medical History:  Diagnosis Date  . Asthma   . Hypertension      No Known Allergies   Current Outpatient Medications  Medication Sig Dispense Refill  . apixaban (ELIQUIS) 5 MG TABS tablet Take 1 tablet (5 mg total) by mouth 2 (two) times daily. 28 tablet 0  . aspirin 81 MG EC tablet Take 1 tablet (81 mg total) by mouth daily. 30 tablet 11  . atorvastatin (LIPITOR) 40 MG tablet Take 1 tablet (40 mg total) by mouth daily. 30 tablet 11  . carvedilol (COREG) 6.25 MG tablet Take 1.5 tablets (9.375 mg total) by mouth 2 (two) times daily. 270 tablet 3  . losartan (COZAAR) 100 MG tablet Take 1 tablet (100 mg total) by mouth daily. 90 tablet 1  . spironolactone (ALDACTONE) 25 MG tablet Take 1 tablet (25 mg total) daily by mouth. 90 tablet 3   No current facility-administered medications for this visit.      Past Surgical History:  Procedure Laterality Date  . LEFT HEART CATH AND CORONARY ANGIOGRAPHY N/A 07/01/2017   Procedure: LEFT HEART CATH AND CORONARY ANGIOGRAPHY;  Surgeon: Corky CraftsVaranasi, Jayadeep S, MD;  [facility];  Service: Cardiovascular;  Laterality: N/A;     No Known Allergies    Family History  Problem Relation Age of Onset  . Alzheimer's disease Mother   . Cancer Father      Social History Craig Robinson reports that  has never smoked. he has never used  smokeless tobacco. Craig Robinson reports that he does not drink alcohol.   Review of Systems CONSTITUTIONAL: No weight loss, fever, chills, weakness or fatigue.  HEENT: Eyes: No visual loss, blurred vision, double vision or yellow sclerae.No hearing loss, sneezing, congestion, runny nose or sore throat.  SKIN: No rash or itching.  CARDIOVASCULAR: per hpi RESPIRATORY: No shortness of breath, cough or sputum.  GASTROINTESTINAL: No anorexia, nausea, vomiting or diarrhea. No abdominal pain or blood.  GENITOURINARY: No burning on urination, no polyuria NEUROLOGICAL: No headache, dizziness, syncope, paralysis, ataxia, numbness or tingling in the extremities. No change in bowel or bladder control.  MUSCULOSKELETAL: No muscle, back pain, joint pain or stiffness.  LYMPHATICS: No enlarged nodes. No history of splenectomy.  PSYCHIATRIC: No history of depression or anxiety.  ENDOCRINOLOGIC: No reports of sweating, cold or heat intolerance. No polyuria or polydipsia.  Marland Kitchen.   Physical Examination Vitals:   10/24/17 0938  BP: 116/69  Pulse: (!) 56  SpO2: 100%   Vitals:   10/24/17 0938  Weight: 280 lb (127 kg)  Height: 6\' 1"  (1.854 m)    Gen: resting comfortably, no acute distress HEENT: no scleral icterus, pupils equal round and reactive, no palptable cervical adenopathy,  CV: RRR, no m/r/g, no jvd Resp: Clear to auscultation bilaterally GI: abdomen is soft, non-tender, non-distended, normal  bowel sounds, no hepatosplenomegaly MSK: extremities are warm, no edema.  Skin: warm, no rash Neuro:  no focal deficits Psych: appropriate affect   Diagnostic Studies 06/2017 cath  LV end diastolic pressure is moderately elevated. LVEDP 21 mm Hg.  There is no aortic valve stenosis.  No angiographically apparent coronary artery disease.  Continue medical therapy for atrial fibrillation. Assess LVEF with echocardiogram.  06/2017 echo Study Conclusions  - Left ventricle: The cavity size was  severely dilated. Wall thickness was normal. Systolic function was severely reduced. The estimated ejection fraction was in the range of 25% to 30%. Diffuse hypokinesis. Doppler parameters are consistent with abnormal left ventricular relaxation (grade 1 diastolic dysfunction). - Left atrium: The atrium was moderately dilated.  Impressions:  - Definity used; evere global reduction in LV systolic function; severe LVE; mild diastolic dysfunction; moderate LAE.   06/2017 event monitor  Min HR 33, Max HR 132, Avg HR 62. Low heart rate at 543AM presumably while sleeping  Telemetry tracings show sinus rhtyhm with multiple episodes of wide complex tachycardia, probable NSVT. Longest run 29 beats  No reported symptoms      Assessment and Plan  1. Chronic systolic HF - denies any symptoms, appears euvolemic in clinic. - repeat BMET/Mg. On goal doses of aldactone and ARB, beta blocker dosing limited due to low heart rates - likely repeat echo in 1-2 months to reassess LV function  2. NSVT - continue to follow with EP. I had messaged Dr Ladona Ridgel after monitor results due to NSVT burden, in absence of symptoms plan is to continue medical therapy.  - repeat echo likely 1 month, if LVEF remains low will need ICD consideration.     Dina Rich MD

## 2017-10-27 ENCOUNTER — Other Ambulatory Visit (HOSPITAL_COMMUNITY)
Admission: RE | Admit: 2017-10-27 | Discharge: 2017-10-27 | Disposition: A | Discharge status: Home / Self Care | Payer: Medicare PPO | Source: Ambulatory Visit | Attending: Cardiology | Admitting: Cardiology

## 2017-10-27 DIAGNOSIS — I10 Essential (primary) hypertension: Secondary | ICD-10-CM | POA: Insufficient documentation

## 2017-10-27 LAB — BASIC METABOLIC PANEL
ANION GAP: 8 (ref 5–15)
BUN: 13 mg/dL (ref 6–20)
CO2: 28 mmol/L (ref 22–32)
Calcium: 9.1 mg/dL (ref 8.9–10.3)
Chloride: 104 mmol/L (ref 101–111)
Creatinine, Ser: 0.76 mg/dL (ref 0.61–1.24)
GFR calc Af Amer: >60 mL/min (ref >60)
GFR calc non Af Amer: >60 mL/min (ref >60)
GLUCOSE: 111 mg/dL — AB (ref 65–99)
POTASSIUM: 4.5 mmol/L (ref 3.5–5.1)
Sodium: 140 mmol/L (ref 135–145)

## 2017-10-27 LAB — MAGNESIUM: Magnesium: 1.8 mg/dL (ref 1.7–2.4)

## 2017-10-30 ENCOUNTER — Telehealth: Payer: Self-pay

## 2017-10-30 NOTE — Telephone Encounter (Signed)
Patient notified. Routed to PCP 

## 2017-10-30 NOTE — Telephone Encounter (Signed)
-----   Message from Antoine Poche, MD sent at 10/29/2017  1:34 PM EST ----- Labs look good  Dominga Ferry MD

## 2017-11-28 ENCOUNTER — Encounter: Payer: Self-pay | Admitting: Cardiology

## 2017-11-28 ENCOUNTER — Ambulatory Visit: Payer: Medicare HMO | Admitting: Cardiology

## 2017-11-28 ENCOUNTER — Ambulatory Visit: Payer: Medicare PPO | Admitting: Cardiology

## 2017-11-28 VITALS — BP 124/74 | HR 64 | Ht 73.0 in | Wt 280.0 lb

## 2017-11-28 DIAGNOSIS — I5022 Chronic systolic (congestive) heart failure: Secondary | ICD-10-CM

## 2017-11-28 DIAGNOSIS — I472 Ventricular tachycardia: Secondary | ICD-10-CM | POA: Diagnosis not present

## 2017-11-28 DIAGNOSIS — I4891 Unspecified atrial fibrillation: Secondary | ICD-10-CM

## 2017-11-28 DIAGNOSIS — I4729 Other ventricular tachycardia: Secondary | ICD-10-CM

## 2017-11-28 NOTE — Patient Instructions (Signed)
Medication Instructions:  Stop aspirin   Labwork: none  Testing/Procedures: Your physician has requested that you have an echocardiogram. Echocardiography is a painless test that uses sound waves to create images of your heart. It provides your doctor with information about the size and shape of your heart and how well your heart's chambers and valves are working. This procedure takes approximately one hour. There are no restrictions for this procedure.    Follow-Up: Your physician recommends that you schedule a follow-up appointment in: 1 month   Any Other Special Instructions Will Be Listed Below (If Applicable).     If you need a refill on your cardiac medications before your next appointment, please call your pharmacy.

## 2017-11-28 NOTE — Progress Notes (Signed)
Clinical Summary Craig Robinson is a 67 y.o.male seen for follow up of the following medical problems.    1. Chronic systolic heart failure - 06/2017 echo LVEF 25-30%, diffuse hypokinesis, grade I diastolic dysfunction - 06/2017 cath without obstructive CAD  - no SOB/DOE. No LE edema - not checking home weights. Not limiting salt intake. Avoiding NSAIDs   2. NSVT - denies any palpitations.   3. PAF - denies any palpitstions - no bleeding on eliquis.  Past Medical History:  Diagnosis Date  . Asthma   . Hypertension      No Known Allergies   Current Outpatient Medications  Medication Sig Dispense Refill  . apixaban (ELIQUIS) 5 MG TABS tablet Take 1 tablet (5 mg total) by mouth 2 (two) times daily. 28 tablet 0  . aspirin 81 MG EC tablet Take 1 tablet (81 mg total) by mouth daily. 30 tablet 11  . atorvastatin (LIPITOR) 40 MG tablet Take 1 tablet (40 mg total) by mouth daily. 30 tablet 11  . carvedilol (COREG) 6.25 MG tablet Take 1.5 tablets (9.375 mg total) by mouth 2 (two) times daily. 270 tablet 3  . losartan (COZAAR) 100 MG tablet Take 1 tablet (100 mg total) by mouth daily. 90 tablet 1  . spironolactone (ALDACTONE) 25 MG tablet Take 1 tablet (25 mg total) daily by mouth. 90 tablet 3   No current facility-administered medications for this visit.      Past Surgical History:  Procedure Laterality Date  . LEFT HEART CATH AND CORONARY ANGIOGRAPHY N/A 07/01/2017   Procedure: LEFT HEART CATH AND CORONARY ANGIOGRAPHY;  Surgeon: Corky Crafts, MD;  Location: [facility] INVASIVE CV LAB;  Service: Cardiovascular;  Laterality: N/A;     No Known Allergies    Family History  Problem Relation Age of Onset  . Alzheimer's disease Mother   . Cancer Father      Social History Craig Robinson reports that  has never smoked. he has never used smokeless tobacco. Craig Robinson reports that he does not drink alcohol.   Review of Systems CONSTITUTIONAL: No weight loss, fever,  chills, weakness or fatigue.  HEENT: Eyes: No visual loss, blurred vision, double vision or yellow sclerae.No hearing loss, sneezing, congestion, runny nose or sore throat.  SKIN: No rash or itching.  CARDIOVASCULAR: per hpi RESPIRATORY: No shortness of breath, cough or sputum.  GASTROINTESTINAL: No anorexia, nausea, vomiting or diarrhea. No abdominal pain or blood.  GENITOURINARY: No burning on urination, no polyuria NEUROLOGICAL: No headache, dizziness, syncope, paralysis, ataxia, numbness or tingling in the extremities. No change in bowel or bladder control.  MUSCULOSKELETAL: No muscle, back pain, joint pain or stiffness.  LYMPHATICS: No enlarged nodes. No history of splenectomy.  PSYCHIATRIC: No history of depression or anxiety.  ENDOCRINOLOGIC: No reports of sweating, cold or heat intolerance. No polyuria or polydipsia.  Marland Kitchen   Physical Examination Vitals:   11/28/17 1401  BP: 124/74  Pulse: 64  SpO2: 98%   Vitals:   11/28/17 1401  Weight: 280 lb (127 kg)  Height: 6\' 1"  (1.854 m)    Gen: resting comfortably, no acute distress HEENT: no scleral icterus, pupils equal round and reactive, no palptable cervical adenopathy,  CV: RRR, no m/r/g, no jvd Resp: Clear to auscultation bilaterally GI: abdomen is soft, non-tender, non-distended, normal bowel sounds, no hepatosplenomegaly MSK: extremities are warm, no edema.  Skin: warm, no rash Neuro:  no focal deficits Psych: appropriate affect   Diagnostic Studies 06/2017 cath  LV end diastolic pressure is moderately elevated. LVEDP 21 mm Hg.  There is no aortic valve stenosis.  No angiographically apparent coronary artery disease.  Continue medical therapy for atrial fibrillation. Assess LVEF with echocardiogram.  06/2017 echo Study Conclusions  - Left ventricle: The cavity size was severely dilated. Wall thickness was normal. Systolic function was severely reduced. The estimated ejection fraction was in the range  of 25% to 30%. Diffuse hypokinesis. Doppler parameters are consistent with abnormal left ventricular relaxation (grade 1 diastolic dysfunction). - Left atrium: The atrium was moderately dilated.  Impressions:  - Definity used; evere global reduction in LV systolic function; severe LVE; mild diastolic dysfunction; moderate LAE.   06/2017 event monitor  Min HR 33, Max HR 132, Avg HR 62. Low heart rate at 543AM presumably while sleeping  Telemetry tracings show sinus rhtyhm with multiple episodes of wide complex tachycardia, probable NSVT. Longest run 29 beats  No reported symptoms      Assessment and Plan  1. Chronic systolic HF -no recent symptoms - continue current meds. Have not changed to entresto due to cost issues, likely consider in the near future - repeat echo, pending results will need ICD consideration  2. NSVT - continue to follow with EP. I had messaged Dr Ladona Ridgel after monitor results due to NSVT burden, in absence of symptoms plan is to continue medical therapy.   - continue current meds   3. Afib - no symptoms, continue current meds including eliquis. CHADS2Vasc score is 2. Stop aspirin.    Antoine Poche, M.D.,

## 2017-12-03 ENCOUNTER — Encounter: Payer: Self-pay | Admitting: Cardiology

## 2017-12-11 ENCOUNTER — Ambulatory Visit (INDEPENDENT_AMBULATORY_CARE_PROVIDER_SITE_OTHER): Payer: Medicare HMO

## 2017-12-11 ENCOUNTER — Other Ambulatory Visit: Payer: Self-pay

## 2017-12-11 DIAGNOSIS — I5022 Chronic systolic (congestive) heart failure: Secondary | ICD-10-CM

## 2018-01-07 ENCOUNTER — Ambulatory Visit: Payer: Medicare HMO | Admitting: Cardiology

## 2018-01-07 ENCOUNTER — Encounter: Payer: Self-pay | Admitting: Cardiology

## 2018-01-07 VITALS — BP 131/78 | HR 43 | Ht 73.0 in | Wt 278.0 lb

## 2018-01-07 DIAGNOSIS — I4729 Other ventricular tachycardia: Secondary | ICD-10-CM

## 2018-01-07 DIAGNOSIS — I472 Ventricular tachycardia: Secondary | ICD-10-CM | POA: Diagnosis not present

## 2018-01-07 DIAGNOSIS — I5022 Chronic systolic (congestive) heart failure: Secondary | ICD-10-CM

## 2018-01-07 DIAGNOSIS — I4891 Unspecified atrial fibrillation: Secondary | ICD-10-CM | POA: Diagnosis not present

## 2018-01-07 MED ORDER — APIXABAN 5 MG PO TABS: 5.0000 mg | ORAL_TABLET | Freq: Two times a day (BID) | ORAL | 0 refills | Status: DC

## 2018-01-07 MED ORDER — SACUBITRIL-VALSARTAN 49-51 MG PO TABS: 1.0000 | ORAL_TABLET | Freq: Two times a day (BID) | ORAL | 1 refills | Status: DC

## 2018-01-07 NOTE — Progress Notes (Signed)
Clinical Summary Craig Robinson is a 67 y.o.male seen for follow up of the following medical problems.    1. Chronic systolic heart failure - 06/2017 echo LVEF 25-30%, diffuse hypokinesis, grade I diastolic dysfunction - 06/2017 cath without obstructive CAD   -Denies any recent symptoms - compliant with meds.   2. NSVT - heavy burden noted previously on event monitor, overall has been asymptomatic. .   - no recent palpitations.    3. PAF - no recent symptoms, tolerating eliquis without difficultly.   Past Medical History:  Diagnosis Date  . Asthma   . Hypertension      No Known Allergies   Current Outpatient Medications  Medication Sig Dispense Refill  . apixaban (ELIQUIS) 5 MG TABS tablet Take 1 tablet (5 mg total) by mouth 2 (two) times daily. 28 tablet 0  . atorvastatin (LIPITOR) 40 MG tablet Take 1 tablet (40 mg total) by mouth daily. 30 tablet 11  . carvedilol (COREG) 6.25 MG tablet Take 1.5 tablets (9.375 mg total) by mouth 2 (two) times daily. 270 tablet 3  . losartan (COZAAR) 100 MG tablet Take 1 tablet (100 mg total) by mouth daily. 90 tablet 1  . spironolactone (ALDACTONE) 25 MG tablet Take 1 tablet (25 mg total) daily by mouth. 90 tablet 3   No current facility-administered medications for this visit.      Past Surgical History:  Procedure Laterality Date  . LEFT HEART CATH AND CORONARY ANGIOGRAPHY N/A 07/01/2017   Procedure: LEFT HEART CATH AND CORONARY ANGIOGRAPHY;  Surgeon: Corky Crafts, MD;  Location: [facility] INVASIVE CV LAB;  Service: Cardiovascular;  Laterality: N/A;     No Known Allergies    Family History  Problem Relation Age of Onset  . Alzheimer's disease Mother   . Cancer Father      Social History Mr. Thrush reports that  has never smoked. he has never used smokeless tobacco. Mr. Bucklew reports that he does not drink alcohol.   Review of Systems CONSTITUTIONAL: No weight loss, fever, chills, weakness or fatigue.   HEENT: Eyes: No visual loss, blurred vision, double vision or yellow sclerae.No hearing loss, sneezing, congestion, runny nose or sore throat.  SKIN: No rash or itching.  CARDIOVASCULAR: per hpi RESPIRATORY: No shortness of breath, cough or sputum.  GASTROINTESTINAL: No anorexia, nausea, vomiting or diarrhea. No abdominal pain or blood.  GENITOURINARY: No burning on urination, no polyuria NEUROLOGICAL: No headache, dizziness, syncope, paralysis, ataxia, numbness or tingling in the extremities. No change in bowel or bladder control.  MUSCULOSKELETAL: No muscle, back pain, joint pain or stiffness.  LYMPHATICS: No enlarged nodes. No history of splenectomy.  PSYCHIATRIC: No history of depression or anxiety.  ENDOCRINOLOGIC: No reports of sweating, cold or heat intolerance. No polyuria or polydipsia.  Marland Kitchen   Physical Examination Vitals:   01/07/18 1442  BP: 131/78  Pulse: (!) 43  SpO2: 98%   Vitals:   01/07/18 1442  Weight: 278 lb (126.1 kg)  Height: 6\' 1"  (1.854 m)    Gen: resting comfortably, no acute distress HEENT: no scleral icterus, pupils equal round and reactive, no palptable cervical adenopathy,  CV: RRR< no m/r/g, no jvd Resp: Clear to auscultation bilaterally GI: abdomen is soft, non-tender, non-distended, normal bowel sounds, no hepatosplenomegaly MSK: extremities are warm, no edema.  Skin: warm, no rash Neuro:  no focal deficits Psych: appropriate affect   Diagnostic Studies  06/2017 cath  LV end diastolic pressure is moderately elevated. LVEDP 21  mm Hg.  There is no aortic valve stenosis.  No angiographically apparent coronary artery disease.  Continue medical therapy for atrial fibrillation. Assess LVEF with echocardiogram.  06/2017 echo Study Conclusions  - Left ventricle: The cavity size was severely dilated. Wall thickness was normal. Systolic function was severely reduced. The estimated ejection fraction was in the range of 25% to  30%. Diffuse hypokinesis. Doppler parameters are consistent with abnormal left ventricular relaxation (grade 1 diastolic dysfunction). - Left atrium: The atrium was moderately dilated.  Impressions:  - Definity used; evere global reduction in LV systolic function; severe LVE; mild diastolic dysfunction; moderate LAE.   06/2017 event monitor  Min HR 33, Max HR 132, Avg HR 62. Low heart rate at 543AM presumably while sleeping  Telemetry tracings show sinus rhtyhm with multiple episodes of wide complex tachycardia, probable NSVT. Longest run 29 beats  No reported symptoms  11/2017 echo  Study Conclusions  - Left ventricle: The cavity size was moderately to severely   dilated. Wall thickness was at the upper limits of normal.   Systolic function was severely reduced. The estimated ejection   fraction was in the range of 20% to 25%. Abnormal global   longitudinal strain of 11.9%. Diffuse hypokinesis with regional   variation. The study is not technically sufficient to allow   evaluation of LV diastolic function. - Aortic valve: Mildly calcified annulus. Trileaflet. There was   mild regurgitation. - Mitral valve: Mildly thickened leaflets . There was trivial   regurgitation. - Right atrium: Central venous pressure (est): 3 mm Hg. - Atrial septum: No defect or patent foramen ovale was identified. - Tricuspid valve: There was mild regurgitation. - Pulmonary arteries: PA peak pressure: 29 mm Hg (S). - Pericardium, extracardiac: There was no pericardial effusion.  Assessment and Plan   1. Chronic systolic HF - repeat echo shows LVEF has not improved, remains 20-25% - will need ICD consideration with low LVEF and NSVT on monitor - would like to change entresto however cost may be an issue. We will provide Rx and samples today, he is to call us if issues getting mediation -   2. NSVT - continue to follow with EP - with ongoing LVEF and NSVT on previous monitor, to  discuss possible ICD at upcoming EP appt.    3. Afib - no symptoms, continue current meds including anticoag     Antoine Poche, M.D.

## 2018-01-07 NOTE — Patient Instructions (Signed)
Your physician recommends that you schedule a follow-up appointment in: 4 MONTHS WITH DR [facility]  Your physician has recommended you make the following change in your medication:   WE WILL CHECK ON PRICE OF ENTRESTO  Thank you for choosing [facility]!!

## 2018-01-11 ENCOUNTER — Encounter: Payer: Self-pay | Admitting: Cardiology

## 2018-01-12 ENCOUNTER — Telehealth: Payer: Self-pay | Admitting: *Deleted

## 2018-01-12 NOTE — Telephone Encounter (Signed)
Pt was approved w/$45 co pay for Entresto 49/51 mg twice daily. Pt says will pick up later today. Wanted to clarify with Dr Wyline Mood that he should still be taking spironolactone with Entresto.

## 2018-01-12 NOTE — Telephone Encounter (Signed)
Yes, continue aldactone. He will stop losartan   Dominga Ferry MD

## 2018-01-12 NOTE — Telephone Encounter (Signed)
Pt aware and voiced understanding - updated medication list  

## 2018-01-13 ENCOUNTER — Telehealth: Payer: Self-pay | Admitting: Cardiology

## 2018-01-13 NOTE — Telephone Encounter (Signed)
Mr. Enciso called in regards to his medication . States that he saw on TV about medication causing cancer.   Please call [phone].

## 2018-01-13 NOTE — Telephone Encounter (Signed)
Informed patient that he has already stopped the Losartan & been prescribed the new medication Sherryll Burger).  Patient verbalized understanding and stated that he will be starting it today.

## 2018-01-15 ENCOUNTER — Encounter: Payer: Self-pay | Admitting: Internal Medicine

## 2018-01-15 ENCOUNTER — Ambulatory Visit: Payer: Medicare HMO | Admitting: Internal Medicine

## 2018-01-15 VITALS — BP 120/74 | HR 59 | Ht 73.5 in | Wt 273.0 lb

## 2018-01-15 DIAGNOSIS — I5022 Chronic systolic (congestive) heart failure: Secondary | ICD-10-CM | POA: Diagnosis not present

## 2018-01-15 DIAGNOSIS — I4729 Other ventricular tachycardia: Secondary | ICD-10-CM

## 2018-01-15 DIAGNOSIS — I472 Ventricular tachycardia: Secondary | ICD-10-CM | POA: Diagnosis not present

## 2018-01-15 NOTE — H&P (View-Only) (Signed)
HPI Craig Robinson is referred today by Dr. Wyline Mood for evaluation of CHF and consideration for ICD insertion. He is a pleasant 67 yo man with a h/o HTN, dyslipidemia and was diagnosed with CHF approx 6 months ago. He does not have obstructive CAD and he has been on maximal medical therapy. He has never had syncope. No anginal symptoms. He has class 2 CHF. He denies angina. 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 apixaban (ELIQUIS) 5 MG TABS tablet Take 1 tablet (5 mg total) by mouth 2 (two) times daily. 28 tablet 0  . atorvastatin (LIPITOR) 40 MG tablet Take 1 tablet (40 mg total) by mouth daily. 30 tablet 11  . carvedilol (COREG) 6.25 MG tablet Take 1.5 tablets (9.375 mg total) by mouth 2 (two) times daily. 270 tablet 3  . sacubitril-valsartan (ENTRESTO) 49-51 MG Take 1 tablet by mouth 2 (two) times daily. 60 tablet 1  . spironolactone (ALDACTONE) 25 MG tablet Take 1 tablet (25 mg total) daily by mouth. 90 tablet 3   No current facility-administered medications for this visit.      Past Medical History:  Diagnosis Date  . Asthma   . Hypertension     ROS:   All systems reviewed and negative except as noted in the HPI.   Past Surgical History:  Procedure Laterality Date  . LEFT HEART CATH AND CORONARY ANGIOGRAPHY N/A 07/01/2017   Procedure: LEFT HEART CATH AND CORONARY ANGIOGRAPHY;  Surgeon: Corky Crafts, MD;  Location: [facility] INVASIVE CV LAB;  Service: Cardiovascular;  Laterality: N/A;     Family History  Problem Relation Age of Onset  . Alzheimer's disease Mother   . Cancer Father      Social History   Socioeconomic History  . Marital status: Single    Spouse name: Not on file  . Number of children: Not on file  . Years of education: Not on file  . Highest education level: Not on file  Occupational History  . Not on file  Social Needs  . Financial resource strain:  Not on file  . Food insecurity:    Worry: Not on file    Inability: Not on file  . Transportation needs:    Medical: Not on file    Non-medical: Not on file  Tobacco Use  . Smoking status: Never Smoker  . Smokeless tobacco: Never Used  Substance and Sexual Activity  . Alcohol use: No  . Drug use: No  . Sexual activity: Not Currently    Partners: Female  Lifestyle  . Physical activity:    Days per week: Not on file    Minutes per session: Not on file  . Stress: Not on file  Relationships  . Social connections:    Talks on phone: Not on file    Gets together: Not on file    Attends religious service: Not on file    Active member of club or organization: Not on file    Attends meetings of clubs or organizations: Not on file    Relationship status: Not on file  . Intimate partner violence:    Fear of current or ex partner: Not on file    Emotionally abused: Not on file    Physically abused: Not on file    Forced sexual activity: Not on file  Other Topics Concern  . Not on file  Social History  Narrative  . Not on file     BP 120/74 (BP Location: Left Arm)   Pulse (!) 59   Ht 6' 1.5" (1.867 m)   Wt 273 lb (123.8 kg)   SpO2 98%   BMI 35.53 kg/m   Physical Exam:  Well appearing 67 yo man,  NAD HEENT: Unremarkable Neck:  6 cm JVD, no thyromegally Lymphatics:  No adenopathy Back:  No CVA tenderness Lungs:  Clear with no wheezes HEART:  Regular rate rhythm, no murmurs, no rubs, no clicks Abd:  soft, positive bowel sounds, no organomegally, no rebound, no guarding Ext:  2 plus pulses, no edema, no cyanosis, no clubbing Skin:  No rashes no nodules Neuro:  CN II through XII intact, motor grossly intact  EKG - NSR with a narrow QRS    Assess/Plan: 1. Chronic systolic heart failure - the patient is on maximal medical therapy. He will continuehis current meds. I have reviewed the indications/risks/benefits/goals/expectations of ICD Insertion and he is considering his  options.  2. Obesity - the patient is overweight and I have encouraged him to lose weight. 3. HTN - his blood pressure is well controlled. No change in meds.   Leonia Reeves.D.

## 2018-01-15 NOTE — Progress Notes (Signed)
HPI Craig Robinson is referred today by Dr. Wyline Mood for evaluation of CHF and consideration for ICD insertion. He is a pleasant 67 yo man with a h/o HTN, dyslipidemia and was diagnosed with CHF approx 6 months ago. He does not have obstructive CAD and he has been on maximal medical therapy. He has never had syncope. No anginal symptoms. He has class 2 CHF. He denies angina. 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 apixaban (ELIQUIS) 5 MG TABS tablet Take 1 tablet (5 mg total) by mouth 2 (two) times daily. 28 tablet 0  . atorvastatin (LIPITOR) 40 MG tablet Take 1 tablet (40 mg total) by mouth daily. 30 tablet 11  . carvedilol (COREG) 6.25 MG tablet Take 1.5 tablets (9.375 mg total) by mouth 2 (two) times daily. 270 tablet 3  . sacubitril-valsartan (ENTRESTO) 49-51 MG Take 1 tablet by mouth 2 (two) times daily. 60 tablet 1  . spironolactone (ALDACTONE) 25 MG tablet Take 1 tablet (25 mg total) daily by mouth. 90 tablet 3   No current facility-administered medications for this visit.      Past Medical History:  Diagnosis Date  . Asthma   . Hypertension     ROS:   All systems reviewed and negative except as noted in the HPI.   Past Surgical History:  Procedure Laterality Date  . LEFT HEART CATH AND CORONARY ANGIOGRAPHY N/A 07/01/2017   Procedure: LEFT HEART CATH AND CORONARY ANGIOGRAPHY;  Surgeon: Corky Crafts, MD;  Location: [facility] INVASIVE CV LAB;  Service: Cardiovascular;  Laterality: N/A;     Family History  Problem Relation Age of Onset  . Alzheimer's disease Mother   . Cancer Father      Social History   Socioeconomic History  . Marital status: Single    Spouse name: Not on file  . Number of children: Not on file  . Years of education: Not on file  . Highest education level: Not on file  Occupational History  . Not on file  Social Needs  . Financial resource strain:  Not on file  . Food insecurity:    Worry: Not on file    Inability: Not on file  . Transportation needs:    Medical: Not on file    Non-medical: Not on file  Tobacco Use  . Smoking status: Never Smoker  . Smokeless tobacco: Never Used  Substance and Sexual Activity  . Alcohol use: No  . Drug use: No  . Sexual activity: Not Currently    Partners: Female  Lifestyle  . Physical activity:    Days per week: Not on file    Minutes per session: Not on file  . Stress: Not on file  Relationships  . Social connections:    Talks on phone: Not on file    Gets together: Not on file    Attends religious service: Not on file    Active member of club or organization: Not on file    Attends meetings of clubs or organizations: Not on file    Relationship status: Not on file  . Intimate partner violence:    Fear of current or ex partner: Not on file    Emotionally abused: Not on file    Physically abused: Not on file    Forced sexual activity: Not on file  Other Topics Concern  . Not on file  Social History  Narrative  . Not on file     BP 120/74 (BP Location: Left Arm)   Pulse (!) 59   Ht 6' 1.5" (1.867 m)   Wt 273 lb (123.8 kg)   SpO2 98%   BMI 35.53 kg/m   Physical Exam:  Well appearing 67 yo man,  NAD HEENT: Unremarkable Neck:  6 cm JVD, no thyromegally Lymphatics:  No adenopathy Back:  No CVA tenderness Lungs:  Clear with no wheezes HEART:  Regular rate rhythm, no murmurs, no rubs, no clicks Abd:  soft, positive bowel sounds, no organomegally, no rebound, no guarding Ext:  2 plus pulses, no edema, no cyanosis, no clubbing Skin:  No rashes no nodules Neuro:  CN II through XII intact, motor grossly intact  EKG - NSR with a narrow QRS    Assess/Plan: 1. Chronic systolic heart failure - the patient is on maximal medical therapy. He will continuehis current meds. I have reviewed the indications/risks/benefits/goals/expectations of ICD Insertion and he is considering his  options.  2. Obesity - the patient is overweight and I have encouraged him to lose weight. 3. HTN - his blood pressure is well controlled. No change in meds.   Leonia Reeves.D.

## 2018-01-15 NOTE — Patient Instructions (Signed)
Medication Instructions:  Your physician recommends that you continue on your current medications as directed. Please refer to the Current Medication list given to you today.   Labwork: None   Testing/Procedures: Your physician has recommended that you have a defibrillator inserted. An implantable cardioverter defibrillator (ICD) is a small device that is placed in your chest or, in rare cases, your abdomen. This device uses electrical pulses or shocks to help control life-threatening, irregular heartbeats that could lead the heart to suddenly stop beating (sudden cardiac arrest). Leads are attached to the ICD that goes into your heart. This is done in the hospital and usually requires an overnight stay. Please see the instruction sheet given to you today for more information.  Below are available dates: 01/19/18  01/22/18  01/26/18   01/29/18    02/02/18    02/05/18  Please call Kisha at [phone] or Boneta Lucks at [phone] when you decide   Follow-Up: Your physician recommends that you schedule a follow-up appointment pending.    Any Other Special Instructions Will Be Listed Below (If Applicable).     If you need a refill on your cardiac medications before your next appointment, please call your pharmacy. Thank you for choosing [facility]!

## 2018-01-23 ENCOUNTER — Telehealth: Payer: Self-pay | Admitting: Internal Medicine

## 2018-01-23 DIAGNOSIS — I5022 Chronic systolic (congestive) heart failure: Secondary | ICD-10-CM

## 2018-01-27 ENCOUNTER — Encounter: Payer: Self-pay | Admitting: *Deleted

## 2018-01-27 NOTE — Telephone Encounter (Signed)
Call returned to Pt.  Pt would like to schedule ICD placement for 02/05/2018 @ 9:30 am. Lab orders placed so Pt may have labs drawn at [facility].   Advised instruction letter would be created and can be printed and picked up at [facility] office. Advised Pt to hold Eliquis for 2 days prior to procedure, last dose January 30, 2018 pm dose. Will create instruction letter for pick up.

## 2018-01-27 NOTE — Telephone Encounter (Signed)
Please give pt a call  °

## 2018-01-28 NOTE — Telephone Encounter (Signed)
Letter completed.  NO further action at this time.

## 2018-02-02 ENCOUNTER — Other Ambulatory Visit (HOSPITAL_COMMUNITY)
Admission: RE | Admit: 2018-02-02 | Discharge: 2018-02-02 | Disposition: A | Discharge status: Home / Self Care | Payer: Medicare HMO | Source: Ambulatory Visit | Attending: Internal Medicine | Admitting: Internal Medicine

## 2018-02-02 DIAGNOSIS — I5022 Chronic systolic (congestive) heart failure: Secondary | ICD-10-CM | POA: Insufficient documentation

## 2018-02-02 LAB — CBC WITH DIFFERENTIAL/PLATELET
Basophils Absolute: 0 10*3/uL (ref 0.0–0.1)
Basophils Relative: 0 %
EOS ABS: 0.1 10*3/uL (ref 0.0–0.7)
Eosinophils Relative: 1 %
HEMATOCRIT: 39.5 % (ref 39.0–52.0)
HEMOGLOBIN: 13.5 g/dL (ref 13.0–17.0)
LYMPHS ABS: 1.5 10*3/uL (ref 0.7–4.0)
Lymphocytes Relative: 31 %
MCH: 31.9 pg (ref 26.0–34.0)
MCHC: 34.2 g/dL (ref 30.0–36.0)
MCV: 93.4 fL (ref 78.0–100.0)
MONO ABS: 0.4 10*3/uL (ref 0.1–1.0)
MONOS PCT: 9 %
NEUTROS ABS: 2.8 10*3/uL (ref 1.7–7.7)
NEUTROS PCT: 59 %
Platelets: 192 10*3/uL (ref 150–400)
RBC: 4.23 MIL/uL (ref 4.22–5.81)
RDW: 12.6 % (ref 11.5–15.5)
WBC: 4.7 10*3/uL (ref 4.0–10.5)

## 2018-02-02 LAB — BASIC METABOLIC PANEL
Anion gap: 9 (ref 5–15)
BUN: 13 mg/dL (ref 6–20)
CALCIUM: 9 mg/dL (ref 8.9–10.3)
CHLORIDE: 105 mmol/L (ref 101–111)
CO2: 26 mmol/L (ref 22–32)
CREATININE: 0.75 mg/dL (ref 0.61–1.24)
GFR calc Af Amer: >60 mL/min (ref >60)
GFR calc non Af Amer: >60 mL/min (ref >60)
GLUCOSE: 116 mg/dL — AB (ref 65–99)
Potassium: 4 mmol/L (ref 3.5–5.1)
Sodium: 140 mmol/L (ref 135–145)

## 2018-02-05 ENCOUNTER — Ambulatory Visit (HOSPITAL_COMMUNITY)
Admission: RE | Admit: 2018-02-05 | Discharge: 2018-02-06 | Disposition: A | Discharge status: Home / Self Care | Payer: Medicare HMO | Source: Ambulatory Visit | Attending: Internal Medicine | Admitting: Internal Medicine

## 2018-02-05 ENCOUNTER — Encounter (HOSPITAL_COMMUNITY): Payer: Self-pay | Admitting: Internal Medicine

## 2018-02-05 ENCOUNTER — Encounter (HOSPITAL_COMMUNITY)
Admission: RE | Disposition: A | Discharge status: Home / Self Care | Payer: Self-pay | Source: Ambulatory Visit | Attending: Internal Medicine

## 2018-02-05 DIAGNOSIS — E785 Hyperlipidemia, unspecified: Secondary | ICD-10-CM | POA: Diagnosis not present

## 2018-02-05 DIAGNOSIS — E669 Obesity, unspecified: Secondary | ICD-10-CM | POA: Insufficient documentation

## 2018-02-05 DIAGNOSIS — J45909 Unspecified asthma, uncomplicated: Secondary | ICD-10-CM | POA: Insufficient documentation

## 2018-02-05 DIAGNOSIS — I428 Other cardiomyopathies: Secondary | ICD-10-CM | POA: Insufficient documentation

## 2018-02-05 DIAGNOSIS — I5022 Chronic systolic (congestive) heart failure: Secondary | ICD-10-CM | POA: Diagnosis present

## 2018-02-05 DIAGNOSIS — Z7901 Long term (current) use of anticoagulants: Secondary | ICD-10-CM | POA: Diagnosis not present

## 2018-02-05 DIAGNOSIS — I472 Ventricular tachycardia: Secondary | ICD-10-CM | POA: Insufficient documentation

## 2018-02-05 DIAGNOSIS — I2510 Atherosclerotic heart disease of native coronary artery without angina pectoris: Secondary | ICD-10-CM | POA: Insufficient documentation

## 2018-02-05 DIAGNOSIS — Z6835 Body mass index (BMI) 35.0-35.9, adult: Secondary | ICD-10-CM | POA: Insufficient documentation

## 2018-02-05 DIAGNOSIS — I110 Hypertensive heart disease with heart failure: Secondary | ICD-10-CM | POA: Insufficient documentation

## 2018-02-05 DIAGNOSIS — Z95810 Presence of automatic (implantable) cardiac defibrillator: Secondary | ICD-10-CM

## 2018-02-05 HISTORY — PX: ICD IMPLANT: EP1208

## 2018-02-05 HISTORY — DX: Presence of automatic (implantable) cardiac defibrillator: Z95.810

## 2018-02-05 LAB — SURGICAL PCR SCREEN
MRSA, PCR: NEGATIVE
STAPHYLOCOCCUS AUREUS: NEGATIVE

## 2018-02-05 SURGERY — ICD IMPLANT

## 2018-02-05 MED ORDER — MIDAZOLAM HCL 5 MG/5ML IJ SOLN
INTRAMUSCULAR | Status: AC
  Filled 2018-02-05: qty 5

## 2018-02-05 MED ORDER — SPIRONOLACTONE 25 MG PO TABS
25.0000 mg | ORAL_TABLET | Freq: Every day | ORAL | Status: DC
  Administered 2018-02-05 – 2018-02-06 (×2): 25 mg via ORAL
  Filled 2018-02-05 (×2): qty 1

## 2018-02-05 MED ORDER — LIDOCAINE HCL (PF) 1 % IJ SOLN
INTRAMUSCULAR | Status: DC | PRN
  Administered 2018-02-05: 60 mL

## 2018-02-05 MED ORDER — SACUBITRIL-VALSARTAN 49-51 MG PO TABS
1.0000 | ORAL_TABLET | Freq: Two times a day (BID) | ORAL | Status: DC
  Administered 2018-02-05 – 2018-02-06 (×3): 1 via ORAL
  Filled 2018-02-05 (×3): qty 1

## 2018-02-05 MED ORDER — MIDAZOLAM HCL 5 MG/5ML IJ SOLN
INTRAMUSCULAR | Status: DC | PRN
  Administered 2018-02-05 (×7): 1 mg via INTRAVENOUS

## 2018-02-05 MED ORDER — ATORVASTATIN CALCIUM 40 MG PO TABS
40.0000 mg | ORAL_TABLET | Freq: Every day | ORAL | Status: DC
  Administered 2018-02-05 – 2018-02-06 (×2): 40 mg via ORAL
  Filled 2018-02-05 (×2): qty 1

## 2018-02-05 MED ORDER — MUPIROCIN 2 % EX OINT
TOPICAL_OINTMENT | CUTANEOUS | Status: AC
  Administered 2018-02-05: 1 via TOPICAL
  Filled 2018-02-05: qty 22

## 2018-02-05 MED ORDER — FENTANYL CITRATE (PF) 100 MCG/2ML IJ SOLN
INTRAMUSCULAR | Status: DC | PRN
  Administered 2018-02-05 (×4): 12.5 ug via INTRAVENOUS
  Administered 2018-02-05: 25 ug via INTRAVENOUS
  Administered 2018-02-05: 12.5 ug via INTRAVENOUS

## 2018-02-05 MED ORDER — APIXABAN 5 MG PO TABS
5.0000 mg | ORAL_TABLET | Freq: Two times a day (BID) | ORAL | Status: DC
  Administered 2018-02-05 – 2018-02-06 (×3): 5 mg via ORAL
  Filled 2018-02-05 (×3): qty 1

## 2018-02-05 MED ORDER — FENTANYL CITRATE (PF) 100 MCG/2ML IJ SOLN
INTRAMUSCULAR | Status: AC
  Filled 2018-02-05: qty 2

## 2018-02-05 MED ORDER — HEPARIN (PORCINE) IN NACL 2-0.9 UNIT/ML-% IJ SOLN
INTRAMUSCULAR | Status: AC
  Filled 2018-02-05: qty 500

## 2018-02-05 MED ORDER — CARVEDILOL 3.125 MG PO TABS
9.3750 mg | ORAL_TABLET | Freq: Two times a day (BID) | ORAL | Status: DC
  Administered 2018-02-05 – 2018-02-06 (×3): 9.375 mg via ORAL
  Filled 2018-02-05 (×3): qty 3

## 2018-02-05 MED ORDER — DEXTROSE 5 % IV SOLN
3.0000 g | INTRAVENOUS | Status: AC
  Administered 2018-02-05: 3 g via INTRAVENOUS
  Filled 2018-02-05: qty 3000

## 2018-02-05 MED ORDER — SODIUM CHLORIDE 0.9 % IV SOLN
80.0000 mg | INTRAVENOUS | Status: AC
  Administered 2018-02-05: 80 mg

## 2018-02-05 MED ORDER — HEPARIN (PORCINE) IN NACL 2-0.9 UNIT/ML-% IJ SOLN
INTRAMUSCULAR | Status: AC | PRN
  Administered 2018-02-05: 500 mL

## 2018-02-05 MED ORDER — SODIUM CHLORIDE 0.9 % IV SOLN
INTRAVENOUS | Status: DC
  Administered 2018-02-05: 09:00:00 via INTRAVENOUS

## 2018-02-05 MED ORDER — CHLORHEXIDINE GLUCONATE 4 % EX LIQD
60.0000 mL | Freq: Once | CUTANEOUS | Status: DC
  Filled 2018-02-05: qty 60

## 2018-02-05 MED ORDER — CEFAZOLIN SODIUM-DEXTROSE 1-4 GM/50ML-% IV SOLN
1.0000 g | Freq: Four times a day (QID) | INTRAVENOUS | Status: AC
  Administered 2018-02-05 – 2018-02-06 (×3): 1 g via INTRAVENOUS
  Filled 2018-02-05 (×3): qty 50

## 2018-02-05 MED ORDER — MUPIROCIN 2 % EX OINT
1.0000 "application " | TOPICAL_OINTMENT | Freq: Once | CUTANEOUS | Status: AC
  Administered 2018-02-05: 1 via TOPICAL

## 2018-02-05 MED ORDER — YOU HAVE A PACEMAKER BOOK
Freq: Once | Status: AC
  Administered 2018-02-05: 13:00:00
  Filled 2018-02-05: qty 1

## 2018-02-05 MED ORDER — ONDANSETRON HCL 4 MG/2ML IJ SOLN: 4.0000 mg | Freq: Four times a day (QID) | INTRAMUSCULAR | Status: DC | PRN

## 2018-02-05 MED ORDER — SODIUM CHLORIDE 0.9 % IV SOLN
INTRAVENOUS | Status: AC
  Filled 2018-02-05: qty 2

## 2018-02-05 MED ORDER — ACETAMINOPHEN 325 MG PO TABS: 325.0000 mg | ORAL_TABLET | ORAL | Status: DC | PRN

## 2018-02-05 MED ORDER — YOU HAVE A PACEMAKER BOOK
Freq: Once | Status: AC
  Administered 2018-02-05: 22:00:00
  Filled 2018-02-05: qty 1

## 2018-02-05 MED ORDER — LIDOCAINE HCL (PF) 1 % IJ SOLN
INTRAMUSCULAR | Status: AC
  Filled 2018-02-05: qty 60

## 2018-02-05 SURGICAL SUPPLY — 9 items
CABLE SURGICAL S-101-97-12 (CABLE) ×3 IMPLANT
ICD EVERA XT MRI DF1  DDMB1D1 (ICD Generator) ×2 IMPLANT
ICD EVERA XT MRI DF1 DDMB1D1 (ICD Generator) ×1 IMPLANT
LEAD CAPSURE NOVUS 5076-52CM (Lead) ×3 IMPLANT
LEAD SPRINT QUAT SEC 6935-65CM (Lead) ×3 IMPLANT
PAD DEFIB LIFELINK (PAD) ×3 IMPLANT
SHEATH CLASSIC 7F (SHEATH) ×3 IMPLANT
SHEATH CLASSIC 9F (SHEATH) ×3 IMPLANT
TRAY PACEMAKER INSERTION (PACKS) ×3 IMPLANT

## 2018-02-05 NOTE — H&P (Signed)
I have seen Craig Robinson is a 67 y.o. male in the office today who has been referred by Dr. Wyline Mood for consideration of ICD implant for primary prevention of sudden death.  The patient's chart has been reviewed and they meet criteria for ICD implant.  I have had a thorough discussion with the patient reviewing options.  The patient and their family (if available) have had opportunities to ask questions and have them answered. The patient and I have decided together through a shared decision making process to implant the ICD at this time.  Risks, benefits, alternatives to ICD implantation were discussed in detail with the patient today. The patient  understands that the risks include but are not limited to bleeding, infection, pneumothorax, perforation, tamponade, vascular damage, renal failure, MI, stroke, death, inappropriate shocks, and lead dislodgement and he wishes to proceed.  We will therefore schedule device implantation at the next available time.  Leonia Reeves.D.

## 2018-02-05 NOTE — Discharge Instructions (Addendum)

## 2018-02-05 NOTE — Discharge Summary (Addendum)
ELECTROPHYSIOLOGY PROCEDURE DISCHARGE SUMMARY    Patient ID: Craig Robinson,  MRN: [id number], DOB/AGE: [birth year] 67 y.o.  Admit date: 02/05/2018 Discharge date: 02/06/18  Primary Care Physician: Kirstie Peri, MD  Primary Cardiologist: Dr. Wyline Mood Electrophysiologist: Dr. Ladona Ridgel  Primary Discharge Diagnosis:  1. NICM 2. NSVT  Secondary Discharge Diagnosis:  1. PAFib     CHA2DS2Vasc is 3, on Eliquis 2. HTN 3. Chronic CHF (systolic)   No Known Allergies   Procedures This Admission:  1.  Implantation of a MDT dual chamber ICD on 02/05/18 by Dr Ladona Ridgel.  The patient received a medtronic (serial Number [id number]) IC,  Medtronic (serial # [id number] ) right atrial lead and a Medtronic (serial number [id number]) right ventricular defibrillator lead  DFT's were successful at 20 J.   There were no immediate post procedure complications. 2.  CXR on 02/06/18 demonstrated no pneumothorax status post device implantation.   Brief HPI: Craig Robinson is a 67 y.o. male was referred to electrophysiology in the outpatient setting for consideration of ICD implantation.  Past medical history includes NICM, chronic CHF, NSVT.  The patient has persistent LV dysfunction despite guideline directed therapy.  Risks, benefits, and alternatives to ICD implantation were reviewed with the patient who wished to proceed.   Hospital Course:  The patient was admitted and underwent implantation of an ICD with details as outlined above. He was monitored on telemetry overnight which demonstrated SR, occ AP, infrequent PVCs.  Left chest was without hematoma or ecchymosis.  The device was interrogated and found to be functioning normally.  CXR was obtained and demonstrated no pneumothorax status post device implantation.  Wound care, arm mobility, and restrictions were reviewed with the patient.  The patient feels well today, no CP or SOB, he was examined by Dr.Taylor and considered stable for discharge to  home.   The patient's discharge medications include an ARB Sherryll Burger) and beta blocker (Carvedilol).   Physical Exam: Vitals:   02/05/18 2000 02/05/18 2211 02/06/18 0403 02/06/18 0713  BP:  (!) 129/55 124/66 126/86  Pulse: (!) 59 (!) 58 (!) 59 61  Resp: 13 (!) 8 16 18   Temp:   98.4 F (36.9 C) 98.6 F (37 C)  TempSrc:   Oral   SpO2: 100% 97% 97% 99%  Weight:   277 lb 12.5 oz (126 kg)   Height:        GEN- The patient is well appearing, alert and oriented x 3 today.   HEENT: normocephalic, atraumatic; sclera clear, conjunctiva pink; hearing intact; oropharynx clear Lungs- CTA b/l, normal work of breathing.  No wheezes, rales, rhonchi Heart- RRR, no murmurs, rubs or gallops, PMI not laterally displaced GI- soft, non-tender, non-distended Extremities- no clubbing, cyanosis, or edema MS- no significant deformity or atrophy Skin- warm and dry, no rash or lesion, left chest without hematoma/ecchymosis Psych- euthymic mood, full affect Neuro- no gross defecits  Labs:   Lab Results  Component Value Date   WBC 4.7 02/02/2018   HGB 13.5 02/02/2018   HCT 39.5 02/02/2018   MCV 93.4 02/02/2018   PLT 192 02/02/2018    Recent Labs  Lab 02/02/18 0949  NA 140  K 4.0  CL 105  CO2 26  BUN 13  CREATININE 0.75  CALCIUM 9.0  GLUCOSE 116*    Discharge Medications:  Allergies as of 02/06/2018   No Known Allergies     Medication List    TAKE these medications   apixaban 5 MG Tabs  tablet Commonly known as:  ELIQUIS Take 1 tablet (5 mg total) by mouth 2 (two) times daily.   atorvastatin 40 MG tablet Commonly known as:  LIPITOR Take 1 tablet (40 mg total) by mouth daily.   carvedilol 6.25 MG tablet Commonly known as:  COREG Take 1.5 tablets (9.375 mg total) by mouth 2 (two) times daily.   JOINT HEALTH PO Take 1 tablet by mouth daily.   sacubitril-valsartan 49-51 MG Commonly known as:  ENTRESTO Take 1 tablet by mouth 2 (two) times daily.   spironolactone 25 MG  tablet Commonly known as:  ALDACTONE Take 1 tablet (25 mg total) daily by mouth.       Disposition: Home  Discharge Instructions    Diet - low sodium heart healthy   Complete by:  As directed    Increase activity slowly   Complete by:  As directed      Follow-up Information    [facility] Office Follow up on 02/16/2018.   Specialty:  Cardiology Why:  12:00PM (noon), wound check visit Contact information: [address], [address] [place] [address] [phone]       Marinus Maw, MD Follow up on 05/08/2018.   Specialty:  Cardiology Why:  12:00PM (noon) Contact information: [address] [place] [address] [phone]           Duration of Discharge Encounter: Greater than 30 minutes including physician time.  Norma Fredrickson, PA-C 02/06/2018 8:54 AM

## 2018-02-05 NOTE — Interval H&P Note (Signed)
History and Physical Interval Note:  02/05/2018 9:17 AM  Craig Robinson  has presented today for surgery, with the diagnosis of hf  The various methods of treatment have been discussed with the patient and family. After consideration of risks, benefits and other options for treatment, the patient has consented to  Procedure(s): ICD IMPLANT (N/A) as a surgical intervention .  The patient's history has been reviewed, patient examined, no change in status, stable for surgery.  I have reviewed the patient's chart and labs.  Questions were answered to the patient's satisfaction.     Lewayne Bunting

## 2018-02-06 ENCOUNTER — Ambulatory Visit (HOSPITAL_COMMUNITY): Payer: Medicare HMO

## 2018-02-06 ENCOUNTER — Encounter (HOSPITAL_COMMUNITY): Payer: Self-pay | Admitting: *Deleted

## 2018-02-06 DIAGNOSIS — I2510 Atherosclerotic heart disease of native coronary artery without angina pectoris: Secondary | ICD-10-CM | POA: Diagnosis not present

## 2018-02-06 DIAGNOSIS — I5022 Chronic systolic (congestive) heart failure: Secondary | ICD-10-CM | POA: Diagnosis not present

## 2018-02-06 DIAGNOSIS — I110 Hypertensive heart disease with heart failure: Secondary | ICD-10-CM | POA: Diagnosis not present

## 2018-02-06 DIAGNOSIS — I481 Persistent atrial fibrillation: Secondary | ICD-10-CM

## 2018-02-06 DIAGNOSIS — I428 Other cardiomyopathies: Secondary | ICD-10-CM | POA: Diagnosis not present

## 2018-02-12 ENCOUNTER — Other Ambulatory Visit: Payer: Self-pay | Admitting: Cardiology

## 2018-02-16 ENCOUNTER — Ambulatory Visit (INDEPENDENT_AMBULATORY_CARE_PROVIDER_SITE_OTHER): Payer: Medicare HMO | Admitting: *Deleted

## 2018-02-16 DIAGNOSIS — I428 Other cardiomyopathies: Secondary | ICD-10-CM | POA: Diagnosis not present

## 2018-02-16 LAB — CUP PACEART INCLINIC DEVICE CHECK
Battery Remaining Longevity: 122 mo
Battery Voltage: 3.14 V
Brady Statistic AP VP Percent: 0.07 %
Brady Statistic AS VP Percent: 0.01 %
Brady Statistic AS VS Percent: 38.13 %
Brady Statistic RA Percent Paced: 58.04 %
Brady Statistic RV Percent Paced: 0.08 %
Date Time Interrogation Session: 20190422141907
HIGH POWER IMPEDANCE MEASURED VALUE: 60 Ohm
Implantable Lead Implant Date: 20190411
Implantable Lead Implant Date: 20190411
Implantable Lead Location: 753860
Implantable Pulse Generator Implant Date: 20190411
Lead Channel Impedance Value: 342 Ohm
Lead Channel Impedance Value: 418 Ohm
Lead Channel Pacing Threshold Amplitude: 0.5 V
Lead Channel Pacing Threshold Pulse Width: 0.4 ms
Lead Channel Pacing Threshold Pulse Width: 0.4 ms
Lead Channel Sensing Intrinsic Amplitude: 10.375 mV
Lead Channel Setting Pacing Amplitude: 3.5 V
Lead Channel Setting Sensing Sensitivity: 0.3 mV
MDC IDC LEAD LOCATION: 753859
MDC IDC MSMT LEADCHNL RA IMPEDANCE VALUE: 532 Ohm
MDC IDC MSMT LEADCHNL RA SENSING INTR AMPL: 2.625 mV
MDC IDC MSMT LEADCHNL RA SENSING INTR AMPL: 3.25 mV
MDC IDC MSMT LEADCHNL RV PACING THRESHOLD AMPLITUDE: 0.625 V
MDC IDC MSMT LEADCHNL RV SENSING INTR AMPL: 7.625 mV
MDC IDC SET LEADCHNL RV PACING AMPLITUDE: 3.5 V
MDC IDC SET LEADCHNL RV PACING PULSEWIDTH: 0.4 ms
MDC IDC STAT BRADY AP VS PERCENT: 61.79 %

## 2018-02-16 NOTE — Progress Notes (Signed)
Wound check appointment. Steri-strips removed. Wound without redness or edema. Incision edges approximated, wound well healed. Normal device function. Thresholds, sensing, and impedances consistent with implant measurements. Device programmed at 3.5V for extra safety margin until 3 month visit. Histogram distribution appropriate for patient and level of activity. No mode switches or ventricular arrhythmias noted. Patient educated about wound care, arm mobility, lifting restrictions, shock plan. ROV with GT in 30mo.

## 2018-03-03 ENCOUNTER — Other Ambulatory Visit: Payer: Self-pay | Admitting: Cardiology

## 2018-03-30 ENCOUNTER — Telehealth: Payer: Self-pay | Admitting: *Deleted

## 2018-03-30 MED ORDER — APIXABAN 5 MG PO TABS: 5.0000 mg | ORAL_TABLET | Freq: Two times a day (BID) | ORAL | 0 refills | Status: DC

## 2018-03-30 NOTE — Telephone Encounter (Signed)
Patient called office requesting samples of the Eliquis 5mg  tabs.  Stated his co-pay was $45.00, but now has gone up to over $100.00 +.  Samples ready for pick up & Eliquis 360 support phone number given to see if he can get any help with assistance.  ([phone]-ELIQUIS [phone]).

## 2018-04-23 ENCOUNTER — Telehealth: Payer: Self-pay | Admitting: Cardiology

## 2018-04-23 MED ORDER — APIXABAN 5 MG PO TABS: 5.0000 mg | ORAL_TABLET | Freq: Two times a day (BID) | ORAL | 0 refills | Status: DC

## 2018-04-23 NOTE — Telephone Encounter (Signed)
Pt will come by tomorrow to pick up samples  

## 2018-04-23 NOTE — Telephone Encounter (Signed)
Patient called office requesting samples of the Eliquis 5mg  tabs.

## 2018-05-08 ENCOUNTER — Ambulatory Visit: Payer: Medicare HMO | Admitting: Internal Medicine

## 2018-05-08 ENCOUNTER — Encounter: Payer: Self-pay | Admitting: Internal Medicine

## 2018-05-08 ENCOUNTER — Encounter: Payer: Medicare HMO | Admitting: Internal Medicine

## 2018-05-08 VITALS — BP 92/66 | HR 80 | Ht 73.5 in | Wt 275.8 lb

## 2018-05-08 DIAGNOSIS — I4729 Other ventricular tachycardia: Secondary | ICD-10-CM

## 2018-05-08 DIAGNOSIS — Z95810 Presence of automatic (implantable) cardiac defibrillator: Secondary | ICD-10-CM | POA: Diagnosis not present

## 2018-05-08 DIAGNOSIS — I10 Essential (primary) hypertension: Secondary | ICD-10-CM

## 2018-05-08 DIAGNOSIS — I5022 Chronic systolic (congestive) heart failure: Secondary | ICD-10-CM | POA: Diagnosis not present

## 2018-05-08 DIAGNOSIS — I472 Ventricular tachycardia: Secondary | ICD-10-CM | POA: Diagnosis not present

## 2018-05-08 LAB — CUP PACEART INCLINIC DEVICE CHECK
Battery Voltage: 3.02 V
Brady Statistic AP VP Percent: 0.25 %
Brady Statistic AS VS Percent: 32.45 %
HIGH POWER IMPEDANCE MEASURED VALUE: 65 Ohm
Implantable Lead Implant Date: 20190411
Implantable Lead Location: 753859
Implantable Lead Location: 753860
Implantable Lead Model: 6935
Implantable Pulse Generator Implant Date: 20190411
Lead Channel Impedance Value: 418 Ohm
Lead Channel Pacing Threshold Amplitude: 0.5 V
Lead Channel Pacing Threshold Pulse Width: 0.4 ms
Lead Channel Sensing Intrinsic Amplitude: 3.25 mV
Lead Channel Setting Pacing Amplitude: 2 V
Lead Channel Setting Pacing Amplitude: 2.5 V
Lead Channel Setting Pacing Pulse Width: 0.4 ms
Lead Channel Setting Sensing Sensitivity: 0.3 mV
MDC IDC LEAD IMPLANT DT: 20190411
MDC IDC MSMT BATTERY REMAINING LONGEVITY: 120 mo
MDC IDC MSMT LEADCHNL RA IMPEDANCE VALUE: 513 Ohm
MDC IDC MSMT LEADCHNL RA PACING THRESHOLD AMPLITUDE: 0.5 V
MDC IDC MSMT LEADCHNL RA PACING THRESHOLD PULSEWIDTH: 0.4 ms
MDC IDC MSMT LEADCHNL RV IMPEDANCE VALUE: 475 Ohm
MDC IDC MSMT LEADCHNL RV SENSING INTR AMPL: 10.375 mV
MDC IDC SESS DTM: 20190712142207
MDC IDC STAT BRADY AP VS PERCENT: 67.28 %
MDC IDC STAT BRADY AS VP PERCENT: 0.02 %
MDC IDC STAT BRADY RA PERCENT PACED: 63.45 %
MDC IDC STAT BRADY RV PERCENT PACED: 0.29 %

## 2018-05-08 NOTE — Progress Notes (Signed)
HPI [name] returns today for ICD followup. He is a pleasant 67 yo man with chronic systolic heart failure, severe LV dysfunction, HTN, and dyslipidemia. He has undergone primary prevention ICD insertion 3 months ago. In the interim, he has done well but notes some palpitations. No syncope. He was frying fish several weeks ago when he had VT. No Known Allergies   Current Outpatient Medications  Medication Sig Dispense Refill  . apixaban (ELIQUIS) 5 MG TABS tablet Take 1 tablet (5 mg total) by mouth 2 (two) times daily. 24 tablet 0  . atorvastatin (LIPITOR) 40 MG tablet Take 1 tablet (40 mg total) by mouth daily. 30 tablet 11  . carvedilol (COREG) 6.25 MG tablet Take 1.5 tablets (9.375 mg total) by mouth 2 (two) times daily. 270 tablet 3  . ENTRESTO 49-51 MG TAKE ONE TABLET BY MOUTH TWICE DAILY 60 tablet 6  . OVER THE COUNTER MEDICATION Take 2 tablets by mouth at bedtime. Pain Relief Tablets    . spironolactone (ALDACTONE) 25 MG tablet Take 1 tablet (25 mg total) daily by mouth. 90 tablet 3   No current facility-administered medications for this visit.      Past Medical History:  Diagnosis Date  . AICD (automatic cardioverter/defibrillator) present 02/05/2018  . Asthma   . Hypertension     ROS:   All systems reviewed and negative except as noted in the HPI.   Past Surgical History:  Procedure Laterality Date  . ICD IMPLANT N/A 02/05/2018   Procedure: ICD IMPLANT;  Surgeon: Marinus Maw, MD;  Location: [facility] INVASIVE CV LAB;  Service: Cardiovascular;  Laterality: N/A;  . LEFT HEART CATH AND CORONARY ANGIOGRAPHY N/A 07/01/2017   Procedure: LEFT HEART CATH AND CORONARY ANGIOGRAPHY;  Surgeon: Corky Crafts, MD;  Location: [facility] INVASIVE CV LAB;  Service: Cardiovascular;  Laterality: N/A;     Family History  Problem Relation Age of Onset  . Alzheimer's disease Mother   . Cancer Father      Social History   Socioeconomic History  . Marital status: Single   Spouse name: Not on file  . Number of children: Not on file  . Years of education: Not on file  . Highest education level: Not on file  Occupational History  . Not on file  Social Needs  . Financial resource strain: Not on file  . Food insecurity:    Worry: Not on file    Inability: Not on file  . Transportation needs:    Medical: Not on file    Non-medical: Not on file  Tobacco Use  . Smoking status: Never Smoker  . Smokeless tobacco: Never Used  Substance and Sexual Activity  . Alcohol use: No  . Drug use: No  . Sexual activity: Not Currently    Partners: Female  Lifestyle  . Physical activity:    Days per week: Not on file    Minutes per session: Not on file  . Stress: Not on file  Relationships  . Social connections:    Talks on phone: Not on file    Gets together: Not on file    Attends religious service: Not on file    Active member of club or organization: Not on file    Attends meetings of clubs or organizations: Not on file    Relationship status: Not on file  . Intimate partner violence:    Fear of current or ex partner: Not on file    Emotionally  abused: Not on file    Physically abused: Not on file    Forced sexual activity: Not on file  Other Topics Concern  . Not on file  Social History Narrative  . Not on file     BP 92/66   Pulse 80   Ht 6' 1.5" (1.867 m)   Wt 275 lb 12.8 oz (125.1 kg)   BMI 35.89 kg/m   Physical Exam:  Well appearing but morbidly obese man, NAD HEENT: Unremarkable Neck:  6 cm JVD, no thyromegally Lymphatics:  No adenopathy Back:  No CVA tenderness Lungs:  Clear with no wheezes HEART:  Regular rate rhythm, no murmurs, no rubs, no clicks Abd:  soft, positive bowel sounds, no organomegally, no rebound, no guarding Ext:  2 plus pulses, no edema, no cyanosis, no clubbing Skin:  No rashes no nodules Neuro:  CN II through XII intact, motor grossly intact  EKG - NSR with PVC's  DEVICE  Normal device function.  See  PaceArt for details.   Assess/Plan: 1. Chronic systolic heart failure - his symptoms are class 2. I have encouraged him to lose weight and reduce his salt intake. 2. Obesity - he is encouraged to lose weight. 3. ICD - his Medtronic DDD ICD is working normally. Will recheck in several months. 4. VT - he has had a couple of episodes with successful ICD ATP therapy. I have asked him not to drive for 6 months.  Leonia Reeves.D.

## 2018-05-08 NOTE — Patient Instructions (Addendum)
Medication Instructions:  Your physician recommends that you continue on your current medications as directed. Please refer to the Current Medication list given to you today.  You cannot drive for 6 months.  Labwork: None ordered.  Testing/Procedures: None ordered.  Follow-Up: Your physician wants you to follow-up in: 9 months with Dr. Ladona Ridgel.   You will receive a reminder letter in the mail two months in advance. If you don't receive a letter, please call our office to schedule the follow-up appointment.  Remote monitoring is used to monitor your ICD from home. This monitoring reduces the number of office visits required to check your device to one time per year. It allows [place] to keep an eye on the functioning of your device to ensure it is working properly. You are scheduled for a device check from home on 08/10/2018. You may send your transmission at any time that day. If you have a wireless device, the transmission will be sent automatically. After your physician reviews your transmission, you will receive a postcard with your next transmission date.  Any Other Special Instructions Will Be Listed Below (If Applicable).  If you need a refill on your cardiac medications before your next appointment, please call your pharmacy.

## 2018-05-25 ENCOUNTER — Other Ambulatory Visit: Payer: Self-pay | Admitting: Cardiology

## 2018-05-25 NOTE — Telephone Encounter (Signed)
This is a [place] pt °

## 2018-06-08 ENCOUNTER — Encounter: Payer: Medicare HMO | Admitting: Internal Medicine

## 2018-06-10 DIAGNOSIS — Z299 Encounter for prophylactic measures, unspecified: Secondary | ICD-10-CM | POA: Diagnosis not present

## 2018-06-10 DIAGNOSIS — I10 Essential (primary) hypertension: Secondary | ICD-10-CM | POA: Diagnosis not present

## 2018-06-10 DIAGNOSIS — Z6838 Body mass index (BMI) 38.0-38.9, adult: Secondary | ICD-10-CM | POA: Diagnosis not present

## 2018-06-10 DIAGNOSIS — Z95810 Presence of automatic (implantable) cardiac defibrillator: Secondary | ICD-10-CM | POA: Diagnosis not present

## 2018-06-10 DIAGNOSIS — I5022 Chronic systolic (congestive) heart failure: Secondary | ICD-10-CM | POA: Diagnosis not present

## 2018-06-10 DIAGNOSIS — I4891 Unspecified atrial fibrillation: Secondary | ICD-10-CM | POA: Diagnosis not present

## 2018-07-23 ENCOUNTER — Telehealth: Payer: Self-pay | Admitting: Cardiology

## 2018-07-23 MED ORDER — APIXABAN 5 MG PO TABS: 5.0000 mg | ORAL_TABLET | Freq: Two times a day (BID) | ORAL | 0 refills | Status: DC

## 2018-07-23 NOTE — Telephone Encounter (Signed)
Pt will pick up samples tomorrow morning, also scheduled f/u with Dr Wyline Mood as he was past due for f/u

## 2018-07-23 NOTE — Telephone Encounter (Signed)
Patient calling the office for samples of medication:   1.  What medication and dosage are you requesting samples for?  apixaban (ELIQUIS) 5 MG TABS tablet    2.  Are you currently out of this medication?  Has enough for two more days

## 2018-08-10 ENCOUNTER — Ambulatory Visit (INDEPENDENT_AMBULATORY_CARE_PROVIDER_SITE_OTHER): Payer: Medicare HMO | Admitting: *Deleted

## 2018-08-10 ENCOUNTER — Telehealth: Payer: Self-pay | Admitting: *Deleted

## 2018-08-10 ENCOUNTER — Telehealth: Payer: Self-pay | Admitting: Internal Medicine

## 2018-08-10 DIAGNOSIS — I428 Other cardiomyopathies: Secondary | ICD-10-CM

## 2018-08-10 NOTE — Telephone Encounter (Signed)
New Message ° ° ° ° ° ° ° ° ° °Patient returned your call °

## 2018-08-10 NOTE — Telephone Encounter (Signed)
[name] was made aware of VT episode with ATP in late July, he is still under driving restrictions from episodes earlier in July. NSVT episodes appreciated throught 08/09/18. He has not missed any medications and denies syncope. He follows up with Dr. Wyline Mood 09/01/18.

## 2018-08-10 NOTE — Telephone Encounter (Signed)
See previously open phone note

## 2018-08-10 NOTE — Telephone Encounter (Signed)
LMOM to return call. No answer on home phone.  VT with ATP 7/28 and 05/26/18. Successful.

## 2018-08-11 NOTE — Progress Notes (Signed)
Remote ICD transmission.   

## 2018-08-13 ENCOUNTER — Encounter: Payer: Self-pay | Admitting: Cardiology

## 2018-08-17 ENCOUNTER — Telehealth: Payer: Self-pay | Admitting: Cardiology

## 2018-08-17 NOTE — Telephone Encounter (Signed)
In doughnut hole with medication  apixaban (ELIQUIS) 5 MG TABS tablet

## 2018-08-18 MED ORDER — APIXABAN 5 MG PO TABS: 5.0000 mg | ORAL_TABLET | Freq: Two times a day (BID) | ORAL | 0 refills | Status: DC

## 2018-08-18 NOTE — Telephone Encounter (Signed)
Patient informed that eliquis samples are available for pick up.

## 2018-08-21 ENCOUNTER — Other Ambulatory Visit: Payer: Self-pay | Admitting: Cardiology

## 2018-09-01 ENCOUNTER — Encounter: Payer: Self-pay | Admitting: Cardiology

## 2018-09-01 ENCOUNTER — Ambulatory Visit: Payer: Medicare HMO | Admitting: Cardiology

## 2018-09-01 VITALS — BP 147/85 | HR 80 | Ht 73.0 in | Wt 277.0 lb

## 2018-09-01 DIAGNOSIS — I4891 Unspecified atrial fibrillation: Secondary | ICD-10-CM

## 2018-09-01 DIAGNOSIS — I5022 Chronic systolic (congestive) heart failure: Secondary | ICD-10-CM | POA: Diagnosis not present

## 2018-09-01 DIAGNOSIS — I4729 Other ventricular tachycardia: Secondary | ICD-10-CM

## 2018-09-01 DIAGNOSIS — I472 Ventricular tachycardia: Secondary | ICD-10-CM

## 2018-09-01 MED ORDER — SACUBITRIL-VALSARTAN 97-103 MG PO TABS: 1.0000 | ORAL_TABLET | Freq: Two times a day (BID) | ORAL | 3 refills | Status: DC

## 2018-09-01 NOTE — Patient Instructions (Signed)
Your physician recommends that you schedule a follow-up appointment in: 3 MONTHS WITH DR [facility]  Your physician has recommended you make the following change in your medication:   INCREASE ENTRESTO 97/103 MG TWICE DAILY   Your physician recommends that you return for lab work in: 2 WEEKS BMP/MG - YOU DO NOT NEED TO BE FASTING - WE HAVE GIVEN YOU ORDERS TODAY   Thank you for choosing [facility]!!

## 2018-09-01 NOTE — Progress Notes (Signed)
Clinical Summary Mr. Seeberger is a 67 y.o.male seen for follow up of the following medical problems.   1. Chronic systolic heart failure - 06/2017 echo LVEF 25-30%, diffuse hypokinesis, grade I diastolic dysfunction - 06/2017 cath without obstructive CAD - AICD followed by Dr Ladona Ridgel    - no recent SOB/DOE. No recent edema - compliant with meds.   2. NSVT/VT - from EP notes patient had some episodes of VT by device check that terminated after ATP.  - no recent palpitations.   3. PAF - no recent symptoms, tolerating eliquis without difficultly.  - denies any palpitations - no bleeding on eliquis.    Past Medical History:  Diagnosis Date  . AICD (automatic cardioverter/defibrillator) present 02/05/2018  . Asthma   . Hypertension      No Known Allergies   Current Outpatient Medications  Medication Sig Dispense Refill  . apixaban (ELIQUIS) 5 MG TABS tablet Take 1 tablet (5 mg total) by mouth 2 (two) times daily. 84 tablet 0  . atorvastatin (LIPITOR) 40 MG tablet TAKE ONE TABLET BY MOUTH DAILY. 90 tablet 3  . carvedilol (COREG) 6.25 MG tablet TAKE 1 AND 1/2 TABLETS BY MOUTH TWICE DAILY. 270 tablet 3  . ENTRESTO 49-51 MG TAKE ONE TABLET BY MOUTH TWICE DAILY 60 tablet 6  . OVER THE COUNTER MEDICATION Take 2 tablets by mouth at bedtime. Pain Relief Tablets    . spironolactone (ALDACTONE) 25 MG tablet TAKE ONE TABLET BY MOUTH DAILY. 90 tablet 3   No current facility-administered medications for this visit.      Past Surgical History:  Procedure Laterality Date  . ICD IMPLANT N/A 02/05/2018   Procedure: ICD IMPLANT;  Surgeon: Marinus Maw, MD;  Location: [facility] INVASIVE CV LAB;  Service: Cardiovascular;  Laterality: N/A;  . LEFT HEART CATH AND CORONARY ANGIOGRAPHY N/A 07/01/2017   Procedure: LEFT HEART CATH AND CORONARY ANGIOGRAPHY;  Surgeon: Corky Crafts, MD;  Location: [facility] INVASIVE CV LAB;  Service: Cardiovascular;  Laterality: N/A;     No Known  Allergies    Family History  Problem Relation Age of Onset  . Alzheimer's disease Mother   . Cancer Father      Social History Mr. Viglione reports that he has never smoked. He has never used smokeless tobacco. Mr. Hazard reports that he does not drink alcohol.   Review of Systems CONSTITUTIONAL: No weight loss, fever, chills, weakness or fatigue.  HEENT: Eyes: No visual loss, blurred vision, double vision or yellow sclerae.No hearing loss, sneezing, congestion, runny nose or sore throat.  SKIN: No rash or itching.  CARDIOVASCULAR: per hpi RESPIRATORY: No shortness of breath, cough or sputum.  GASTROINTESTINAL: No anorexia, nausea, vomiting or diarrhea. No abdominal pain or blood.  GENITOURINARY: No burning on urination, no polyuria NEUROLOGICAL: No headache, dizziness, syncope, paralysis, ataxia, numbness or tingling in the extremities. No change in bowel or bladder control.  MUSCULOSKELETAL: No muscle, back pain, joint pain or stiffness.  LYMPHATICS: No enlarged nodes. No history of splenectomy.  PSYCHIATRIC: No history of depression or anxiety.  ENDOCRINOLOGIC: No reports of sweating, cold or heat intolerance. No polyuria or polydipsia.  Marland Kitchen   Physical Examination Vitals:   09/01/18 0828  BP: (!) 147/85  Pulse: 80  SpO2: 99%   Vitals:   09/01/18 0828  Weight: 277 lb (125.6 kg)  Height: 6\' 1"  (1.854 m)    Gen: resting comfortably, no acute distress HEENT: no scleral icterus, pupils equal round and reactive,  no palptable cervical adenopathy,  CV: RRR, no m/r/g ,n ojvd Resp: Clear to auscultation bilaterally GI: abdomen is soft, non-tender, non-distended, normal bowel sounds, no hepatosplenomegaly MSK: extremities are warm, no edema.  Skin: warm, no rash Neuro:  no focal deficits Psych: appropriate affect   Diagnostic Studies 06/2017 cath  LV end diastolic pressure is moderately elevated. LVEDP 21 mm Hg.  There is no aortic valve stenosis.  No  angiographically apparent coronary artery disease.  Continue medical therapy for atrial fibrillation. Assess LVEF with echocardiogram.  06/2017 echo Study Conclusions  - Left ventricle: The cavity size was severely dilated. Wall thickness was normal. Systolic function was severely reduced. The estimated ejection fraction was in the range of 25% to 30%. Diffuse hypokinesis. Doppler parameters are consistent with abnormal left ventricular relaxation (grade 1 diastolic dysfunction). - Left atrium: The atrium was moderately dilated.  Impressions:  - Definity used; evere global reduction in LV systolic function; severe LVE; mild diastolic dysfunction; moderate LAE.   06/2017 event monitor  Min HR 33, Max HR 132, Avg HR 62. Low heart rate at 543AM presumably while sleeping  Telemetry tracings show sinus rhtyhm with multiple episodes of wide complex tachycardia, probable NSVT. Longest run 29 beats  No reported symptoms  11/2017 echo  Study Conclusions  - Left ventricle: The cavity size was moderately to severely dilated. Wall thickness was at the upper limits of normal. Systolic function was severely reduced. The estimated ejection fraction was in the range of 20% to 25%. Abnormal global longitudinal strain of 11.9%. Diffuse hypokinesis with regional variation. The study is not technically sufficient to allow evaluation of LV diastolic function. - Aortic valve: Mildly calcified annulus. Trileaflet. There was mild regurgitation. - Mitral valve: Mildly thickened leaflets . There was trivial regurgitation. - Right atrium: Central venous pressure (est): 3 mm Hg. - Atrial septum: No defect or patent foramen ovale was identified. - Tricuspid valve: There was mild regurgitation. - Pulmonary arteries: PA peak pressure: 29 mm Hg (S). - Pericardium, extracardiac: There was no pericardial effusion.    Assessment and Plan  1. Chronic systolic  HF - no recent symptoms, he is euvolemic today. NYHA II - we will increase his entresto to 97/103mg  bid, check BMET in 2 weeks   2. 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 Afib - no recent symptoms, continue current meds including anticoag   F/u 3 months.    Antoine Poche, M.D.

## 2018-09-04 ENCOUNTER — Telehealth: Payer: Self-pay | Admitting: Cardiology

## 2018-09-04 LAB — CUP PACEART REMOTE DEVICE CHECK
Battery Remaining Longevity: 116 mo
Battery Voltage: 3.01 V
Brady Statistic AP VP Percent: 0.36 %
Brady Statistic AS VS Percent: 31.66 %
HighPow Impedance: 63 Ohm
Implantable Lead Implant Date: 20190411
Implantable Lead Model: 5076
Implantable Lead Model: 6935
Implantable Pulse Generator Implant Date: 20190411
Lead Channel Impedance Value: 418 Ohm
Lead Channel Pacing Threshold Amplitude: 0.5 V
Lead Channel Pacing Threshold Pulse Width: 0.4 ms
Lead Channel Pacing Threshold Pulse Width: 0.4 ms
Lead Channel Sensing Intrinsic Amplitude: 3 mV
Lead Channel Sensing Intrinsic Amplitude: 3 mV
MDC IDC LEAD IMPLANT DT: 20190411
MDC IDC LEAD LOCATION: 753859
MDC IDC LEAD LOCATION: 753860
MDC IDC MSMT LEADCHNL RV IMPEDANCE VALUE: 456 Ohm
MDC IDC MSMT LEADCHNL RV IMPEDANCE VALUE: 475 Ohm
MDC IDC MSMT LEADCHNL RV PACING THRESHOLD AMPLITUDE: 0.5 V
MDC IDC MSMT LEADCHNL RV SENSING INTR AMPL: 7.5 mV
MDC IDC MSMT LEADCHNL RV SENSING INTR AMPL: 7.5 mV
MDC IDC SESS DTM: 20191014052503
MDC IDC SET LEADCHNL RA PACING AMPLITUDE: 2 V
MDC IDC SET LEADCHNL RV PACING AMPLITUDE: 2.5 V
MDC IDC SET LEADCHNL RV PACING PULSEWIDTH: 0.4 ms
MDC IDC SET LEADCHNL RV SENSING SENSITIVITY: 0.3 mV
MDC IDC STAT BRADY AP VS PERCENT: 67.94 %
MDC IDC STAT BRADY AS VP PERCENT: 0.03 %
MDC IDC STAT BRADY RA PERCENT PACED: 63.2 %
MDC IDC STAT BRADY RV PERCENT PACED: 0.43 %

## 2018-09-04 NOTE — Telephone Encounter (Signed)
Entresto could possibly play a role. I would want to be more sure it was the issue before changing it since the higher dose is better for his heart. I agree working on staying hydrated, perhaps his cold is playing a role as well. I would monitor things and update [place] late next week   Dominga Ferry MD

## 2018-09-04 NOTE — Telephone Encounter (Signed)
Patient called stating that he is started having fainting spells. He wants to know if it could be his medication .

## 2018-09-04 NOTE — Telephone Encounter (Signed)
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 No c/o chest pain. Admits to not drinking enough fluids. No c/o of dizziness or sob since that time. Also stated that he does have a cold. Has not checked BP & HR. Patient is driving and doing routine activities (ADL's). Patient advised to check his BP if he feels dizzy again, drink plenty of fluids and contact our office back if his symptoms return or if they get worse, to go to the ED for an evaluation. Patient asked if the dose increase of entresto could cause dizziness. Patient advised that this could be a factor and that his body has to get use to the higher dosage. Verbalized understanding of plan.

## 2018-09-28 ENCOUNTER — Telehealth: Payer: Self-pay | Admitting: *Deleted

## 2018-09-28 ENCOUNTER — Telehealth: Payer: Self-pay | Admitting: Cardiology

## 2018-09-28 ENCOUNTER — Other Ambulatory Visit (HOSPITAL_COMMUNITY)
Admission: RE | Admit: 2018-09-28 | Discharge: 2018-09-28 | Disposition: A | Discharge status: Home / Self Care | Payer: Medicare HMO | Source: Ambulatory Visit | Attending: Cardiology | Admitting: Cardiology

## 2018-09-28 DIAGNOSIS — I5022 Chronic systolic (congestive) heart failure: Secondary | ICD-10-CM | POA: Insufficient documentation

## 2018-09-28 LAB — BASIC METABOLIC PANEL
Anion gap: 5 (ref 5–15)
BUN: 13 mg/dL (ref 8–23)
CHLORIDE: 108 mmol/L (ref 98–111)
CO2: 28 mmol/L (ref 22–32)
Calcium: 9 mg/dL (ref 8.9–10.3)
Creatinine, Ser: 0.75 mg/dL (ref 0.61–1.24)
GFR calc non Af Amer: >60 mL/min (ref >60)
Glucose, Bld: 110 mg/dL — ABNORMAL HIGH (ref 70–99)
POTASSIUM: 4.3 mmol/L (ref 3.5–5.1)
SODIUM: 141 mmol/L (ref 135–145)

## 2018-09-28 LAB — MAGNESIUM: MAGNESIUM: 1.9 mg/dL (ref 1.7–2.4)

## 2018-09-28 MED ORDER — APIXABAN 5 MG PO TABS: 5.0000 mg | ORAL_TABLET | Freq: Two times a day (BID) | ORAL | 0 refills | Status: DC

## 2018-09-28 NOTE — Telephone Encounter (Signed)
Patient calling the office for samples of medication:   1.  What medication and dosage are you requesting samples for?   Eliquis    2.  Are you currently out of this medication? 2 more pills.

## 2018-09-28 NOTE — Telephone Encounter (Signed)
Pt aware and says he has lightheadedness at least once daily lasting for about 20-30 seconds at a time - denies any other symptoms

## 2018-09-28 NOTE — Telephone Encounter (Signed)
Patient notified via voice mail samples are ready for pick up.

## 2018-09-28 NOTE — Telephone Encounter (Signed)
-----   Message from Antoine Poche, MD sent at 09/28/2018 12:01 PM EST ----- Labs look good, how has he been feeling. I believe last month he was having some lightheadendess/dizziness   Dominga Ferry MD

## 2018-09-30 DIAGNOSIS — Z7189 Other specified counseling: Secondary | ICD-10-CM | POA: Diagnosis not present

## 2018-09-30 DIAGNOSIS — Z0000 Encounter for general adult medical examination without abnormal findings: Secondary | ICD-10-CM | POA: Diagnosis not present

## 2018-09-30 DIAGNOSIS — Z1331 Encounter for screening for depression: Secondary | ICD-10-CM | POA: Diagnosis not present

## 2018-09-30 DIAGNOSIS — Z6839 Body mass index (BMI) 39.0-39.9, adult: Secondary | ICD-10-CM | POA: Diagnosis not present

## 2018-09-30 DIAGNOSIS — R5383 Other fatigue: Secondary | ICD-10-CM | POA: Diagnosis not present

## 2018-09-30 DIAGNOSIS — Z1339 Encounter for screening examination for other mental health and behavioral disorders: Secondary | ICD-10-CM | POA: Diagnosis not present

## 2018-09-30 DIAGNOSIS — Z125 Encounter for screening for malignant neoplasm of prostate: Secondary | ICD-10-CM | POA: Diagnosis not present

## 2018-09-30 DIAGNOSIS — Z1211 Encounter for screening for malignant neoplasm of colon: Secondary | ICD-10-CM | POA: Diagnosis not present

## 2018-09-30 DIAGNOSIS — Z79899 Other long term (current) drug therapy: Secondary | ICD-10-CM | POA: Diagnosis not present

## 2018-09-30 DIAGNOSIS — E7800 Pure hypercholesterolemia, unspecified: Secondary | ICD-10-CM | POA: Diagnosis not present

## 2018-09-30 DIAGNOSIS — E669 Obesity, unspecified: Secondary | ICD-10-CM | POA: Diagnosis not present

## 2018-09-30 DIAGNOSIS — Z299 Encounter for prophylactic measures, unspecified: Secondary | ICD-10-CM | POA: Diagnosis not present

## 2018-10-12 ENCOUNTER — Telehealth: Payer: Self-pay | Admitting: Cardiology

## 2018-10-12 MED ORDER — APIXABAN 5 MG PO TABS: 5.0000 mg | ORAL_TABLET | Freq: Two times a day (BID) | ORAL | 0 refills | Status: DC

## 2018-10-12 NOTE — Telephone Encounter (Signed)
Patient calling the office for samples of medication:   1.  What medication and dosage are you requesting samples for? Eliquis until Jan  2.  Are you currently out of this medication? Has enough for today and tomorrow

## 2018-10-29 DIAGNOSIS — H2513 Age-related nuclear cataract, bilateral: Secondary | ICD-10-CM | POA: Diagnosis not present

## 2018-10-29 DIAGNOSIS — H5210 Myopia, unspecified eye: Secondary | ICD-10-CM | POA: Diagnosis not present

## 2018-10-29 DIAGNOSIS — H2510 Age-related nuclear cataract, unspecified eye: Secondary | ICD-10-CM | POA: Diagnosis not present

## 2018-10-29 DIAGNOSIS — I10 Essential (primary) hypertension: Secondary | ICD-10-CM | POA: Diagnosis not present

## 2018-11-03 ENCOUNTER — Telehealth: Payer: Self-pay

## 2018-11-03 NOTE — Telephone Encounter (Signed)
Spoke with Craig Robinson regarding ICD shock from this morning Craig Robinson stated that he thought he was dreaming and was just startled awake advised Craig Robinson that Dr .Ladona Ridgel recommended that he f/u with him in [facility] next week Craig Robinson agreeable to apt on 11/11/2018 at 11:15am in [facility]

## 2018-11-03 NOTE — Telephone Encounter (Signed)
Per GT pt needs apt with him in [facility] at 8:30 on 11/11/2018 to discuss ICD therapy

## 2018-11-03 NOTE — Telephone Encounter (Signed)
Follow up: ° ° ° °Patient returning call back. Please call patient back. °

## 2018-11-03 NOTE — Telephone Encounter (Signed)
LVM on home and cell phone for pt pt call device clinic back regarding ICD shock from 11/03/2018 @ 0535am

## 2018-11-04 ENCOUNTER — Other Ambulatory Visit: Payer: Self-pay | Admitting: Internal Medicine

## 2018-11-09 ENCOUNTER — Ambulatory Visit (INDEPENDENT_AMBULATORY_CARE_PROVIDER_SITE_OTHER): Payer: Medicare HMO

## 2018-11-09 DIAGNOSIS — I428 Other cardiomyopathies: Secondary | ICD-10-CM

## 2018-11-09 DIAGNOSIS — I5022 Chronic systolic (congestive) heart failure: Secondary | ICD-10-CM

## 2018-11-10 NOTE — Progress Notes (Signed)
Remote ICD transmission.   

## 2018-11-11 ENCOUNTER — Ambulatory Visit: Payer: Medicare HMO | Admitting: Internal Medicine

## 2018-11-11 ENCOUNTER — Encounter: Payer: Self-pay | Admitting: Internal Medicine

## 2018-11-11 VITALS — BP 138/78 | HR 79 | Ht 73.0 in | Wt 280.0 lb

## 2018-11-11 DIAGNOSIS — I428 Other cardiomyopathies: Secondary | ICD-10-CM | POA: Diagnosis not present

## 2018-11-11 DIAGNOSIS — I472 Ventricular tachycardia, unspecified: Secondary | ICD-10-CM

## 2018-11-11 DIAGNOSIS — Z95810 Presence of automatic (implantable) cardiac defibrillator: Secondary | ICD-10-CM | POA: Diagnosis not present

## 2018-11-11 DIAGNOSIS — Z79899 Other long term (current) drug therapy: Secondary | ICD-10-CM

## 2018-11-11 DIAGNOSIS — I5022 Chronic systolic (congestive) heart failure: Secondary | ICD-10-CM

## 2018-11-11 LAB — CUP PACEART REMOTE DEVICE CHECK
Battery Remaining Longevity: 114 mo
Battery Voltage: 3.01 V
Brady Statistic AP VP Percent: 0.67 %
Brady Statistic RA Percent Paced: 56.37 %
HighPow Impedance: 61 Ohm
Implantable Lead Implant Date: 20190411
Implantable Lead Location: 753860
Implantable Pulse Generator Implant Date: 20190411
Lead Channel Impedance Value: 342 Ohm
Lead Channel Impedance Value: 418 Ohm
Lead Channel Pacing Threshold Amplitude: 0.5 V
Lead Channel Pacing Threshold Pulse Width: 0.4 ms
Lead Channel Sensing Intrinsic Amplitude: 3.25 mV
Lead Channel Sensing Intrinsic Amplitude: 8.375 mV
Lead Channel Setting Pacing Amplitude: 2 V
Lead Channel Setting Sensing Sensitivity: 0.3 mV
MDC IDC LEAD IMPLANT DT: 20190411
MDC IDC LEAD LOCATION: 753859
MDC IDC MSMT LEADCHNL RA IMPEDANCE VALUE: 456 Ohm
MDC IDC MSMT LEADCHNL RA SENSING INTR AMPL: 3.25 mV
MDC IDC MSMT LEADCHNL RV PACING THRESHOLD AMPLITUDE: 0.625 V
MDC IDC MSMT LEADCHNL RV PACING THRESHOLD PULSEWIDTH: 0.4 ms
MDC IDC MSMT LEADCHNL RV SENSING INTR AMPL: 8.375 mV
MDC IDC SESS DTM: 20200113083624
MDC IDC SET LEADCHNL RV PACING AMPLITUDE: 2.5 V
MDC IDC SET LEADCHNL RV PACING PULSEWIDTH: 0.4 ms
MDC IDC STAT BRADY AP VS PERCENT: 63.4 %
MDC IDC STAT BRADY AS VP PERCENT: 0.11 %
MDC IDC STAT BRADY AS VS PERCENT: 35.82 %
MDC IDC STAT BRADY RV PERCENT PACED: 0.76 %

## 2018-11-11 LAB — CUP PACEART INCLINIC DEVICE CHECK
Battery Remaining Longevity: 114 mo
Battery Voltage: 3.01 V
Brady Statistic AP VS Percent: 66.36 %
Brady Statistic AS VS Percent: 33.07 %
Brady Statistic RA Percent Paced: 60.38 %
Brady Statistic RV Percent Paced: 0.59 %
HIGH POWER IMPEDANCE MEASURED VALUE: 65 Ohm
Implantable Lead Implant Date: 20190411
Implantable Lead Location: 753859
Implantable Lead Location: 753860
Implantable Lead Model: 5076
Implantable Lead Model: 6935
Implantable Pulse Generator Implant Date: 20190411
Lead Channel Impedance Value: 361 Ohm
Lead Channel Impedance Value: 456 Ohm
Lead Channel Pacing Threshold Amplitude: 0.625 V
Lead Channel Pacing Threshold Amplitude: 0.625 V
Lead Channel Sensing Intrinsic Amplitude: 3 mV
Lead Channel Sensing Intrinsic Amplitude: 7.125 mV
Lead Channel Setting Pacing Pulse Width: 0.4 ms
Lead Channel Setting Sensing Sensitivity: 0.3 mV
MDC IDC LEAD IMPLANT DT: 20190411
MDC IDC MSMT LEADCHNL RA IMPEDANCE VALUE: 475 Ohm
MDC IDC MSMT LEADCHNL RA PACING THRESHOLD PULSEWIDTH: 0.4 ms
MDC IDC MSMT LEADCHNL RA SENSING INTR AMPL: 3.875 mV
MDC IDC MSMT LEADCHNL RV PACING THRESHOLD PULSEWIDTH: 0.4 ms
MDC IDC MSMT LEADCHNL RV SENSING INTR AMPL: 10.25 mV
MDC IDC SESS DTM: 20200115123711
MDC IDC SET LEADCHNL RA PACING AMPLITUDE: 2 V
MDC IDC SET LEADCHNL RV PACING AMPLITUDE: 2.5 V
MDC IDC STAT BRADY AP VP PERCENT: 0.52 %
MDC IDC STAT BRADY AS VP PERCENT: 0.05 %

## 2018-11-11 MED ORDER — FUROSEMIDE 40 MG PO TABS: 40.0000 mg | ORAL_TABLET | Freq: Every day | ORAL | 3 refills | Status: DC | PRN

## 2018-11-11 MED ORDER — AMIODARONE HCL 200 MG PO TABS: ORAL_TABLET | ORAL | 3 refills | Status: DC

## 2018-11-11 NOTE — Progress Notes (Signed)
HPI [name] returns today for followup of chronic systolic heart failure, VT, and obesity. He has had recurrent episodes of VT for which ATP has been successful until the last a few weeks ago where he accelerated into the VF zone and received a shock. He was sleeping and had no symptoms. He feels well. He has not experienced syncope. No anginal symptoms. He has not required a loop diuretic nor an AA drug. He has minimal peripheral edema.  No Known Allergies   Current Outpatient Medications  Medication Sig Dispense Refill  . apixaban (ELIQUIS) 5 MG TABS tablet Take 1 tablet (5 mg total) by mouth 2 (two) times daily. 42 tablet 0  . atorvastatin (LIPITOR) 40 MG tablet TAKE ONE TABLET BY MOUTH DAILY. 90 tablet 3  . carvedilol (COREG) 6.25 MG tablet TAKE 1 AND 1/2 TABLETS BY MOUTH TWICE DAILY. 270 tablet 3  . OVER THE COUNTER MEDICATION Take 2 tablets by mouth at bedtime. Pain Relief Tablets    . sacubitril-valsartan (ENTRESTO) 97-103 MG Take 1 tablet by mouth 2 (two) times daily. 60 tablet 3  . spironolactone (ALDACTONE) 25 MG tablet TAKE ONE TABLET BY MOUTH DAILY. 90 tablet 3   No current facility-administered medications for this visit.      Past Medical History:  Diagnosis Date  . AICD (automatic cardioverter/defibrillator) present 02/05/2018  . Asthma   . Hypertension     ROS:   All systems reviewed and negative except as noted in the HPI.   Past Surgical History:  Procedure Laterality Date  . ICD IMPLANT N/A 02/05/2018   Procedure: ICD IMPLANT;  Surgeon: Marinus Maw, MD;  Location: [facility] INVASIVE CV LAB;  Service: Cardiovascular;  Laterality: N/A;  . LEFT HEART CATH AND CORONARY ANGIOGRAPHY N/A 07/01/2017   Procedure: LEFT HEART CATH AND CORONARY ANGIOGRAPHY;  Surgeon: Corky Crafts, MD;  Location: [facility] INVASIVE CV LAB;  Service: Cardiovascular;  Laterality: N/A;     Family History  Problem Relation Age of Onset  . Alzheimer's disease Mother   . Cancer  Father      Social History   Socioeconomic History  . Marital status: Single    Spouse name: Not on file  . Number of children: Not on file  . Years of education: Not on file  . Highest education level: Not on file  Occupational History  . Not on file  Social Needs  . Financial resource strain: Not on file  . Food insecurity:    Worry: Not on file    Inability: Not on file  . Transportation needs:    Medical: Not on file    Non-medical: Not on file  Tobacco Use  . Smoking status: Never Smoker  . Smokeless tobacco: Never Used  Substance and Sexual Activity  . Alcohol use: No  . Drug use: No  . Sexual activity: Not Currently    Partners: Female  Lifestyle  . Physical activity:    Days per week: Not on file    Minutes per session: Not on file  . Stress: Not on file  Relationships  . Social connections:    Talks on phone: Not on file    Gets together: Not on file    Attends religious service: Not on file    Active member of club or organization: Not on file    Attends meetings of clubs or organizations: Not on file    Relationship status: Not on file  . Intimate partner  violence:    Fear of current or ex partner: Not on file    Emotionally abused: Not on file    Physically abused: Not on file    Forced sexual activity: Not on file  Other Topics Concern  . Not on file  Social History Narrative  . Not on file     BP 138/78 (BP Location: Left Arm)   Pulse 79   Ht 6\' 1"  (1.854 m)   Wt 280 lb (127 kg)   SpO2 98%   BMI 36.94 kg/m   Physical Exam:  Well appearing 68 yo man, NAD HEENT: Unremarkable Neck:  6 cm JVD, no thyromegally Lymphatics:  No adenopathy Back:  No CVA tenderness Lungs:  Clear with no wheezes HEART:  Regular rate rhythm, no murmurs, no rubs, no clicks Abd:  soft, positive bowel sounds, no organomegally, no rebound, no guarding Ext:  2 plus pulses, no edema, no cyanosis, no clubbing Skin:  No rashes no nodules Neuro:  CN II through XII  intact, motor grossly intact  EKG - none  DEVICE  Normal device function.  See PaceArt for details.   Assess/Plan: 1. VT/VF - he will start amiodarone 400 mg daily for a month then decrease to 200 mg daily. I have instructed him no to drive. 2. Chronic systolic heart failure - he is mostly compensated though describes occaisional episodes of dyspne and lower extremity swelling. He has been given a script for lasix 40 mg daily to take if his sob or swelling worsen. 3. iCD - his medtronic DD ICD is working normally. 4. Obesity - he is encouraged to lose weight.  Craig Robinson.D.

## 2018-11-11 NOTE — Patient Instructions (Addendum)
Medication Instructions:  Your physician has recommended you make the following change in your medication:  Lasix 40 mg Daily for weight gain of 3 pounds or more in 24 hours. Amiodarone 200 mg two times daily until February 15 then 200 mg Daily   If you need a refill on your cardiac medications before your next appointment, please call your pharmacy.   Lab work: NONE  If you have labs (blood work) drawn today and your tests are completely normal, you will receive your results only by: Marland Kitchen MyChart Message (if you have MyChart) OR . A paper copy in the mail If you have any lab test that is abnormal or we need to change your treatment, we will call you to review the results.  Testing/Procedures: NONE   Follow-Up: At [facility], you and your health needs are our priority.  As part of our continuing mission to provide you with exceptional heart care, we have created designated Provider Care Teams.  These Care Teams include your primary Cardiologist (physician) and Advanced Practice Providers (APPs -  Physician Assistants and Nurse Practitioners) who all work together to provide you with the care you need, when you need it. You will need a follow up appointment in 6 months.  Please call our office 2 months in advance to schedule this appointment.  You may see Dr. Ladona Ridgel or one of the following Advanced Practice Providers on your designated Care Team:   Gypsy Balsam, NP . Francis Dowse, PA-C  Any Other Special Instructions Will Be Listed Below (If Applicable). Thank you for choosing [facility]!

## 2018-11-24 ENCOUNTER — Telehealth: Payer: Self-pay | Admitting: Internal Medicine

## 2018-11-24 NOTE — Telephone Encounter (Signed)
Pt needs to know when to have his lab work done. I did not see it when I looked over his LOV note from Dr. Ladona Ridgel.

## 2018-11-24 NOTE — Telephone Encounter (Signed)
Called pt. No answer, left detailed message for pt to have labs done.

## 2018-12-01 ENCOUNTER — Telehealth: Payer: Self-pay | Admitting: *Deleted

## 2018-12-01 ENCOUNTER — Telehealth: Payer: Self-pay | Admitting: Cardiology

## 2018-12-01 ENCOUNTER — Other Ambulatory Visit (HOSPITAL_COMMUNITY)
Admission: RE | Admit: 2018-12-01 | Discharge: 2018-12-01 | Disposition: A | Discharge status: Home / Self Care | Payer: Medicare HMO | Source: Ambulatory Visit | Attending: Internal Medicine | Admitting: Internal Medicine

## 2018-12-01 DIAGNOSIS — Z79899 Other long term (current) drug therapy: Secondary | ICD-10-CM | POA: Insufficient documentation

## 2018-12-01 DIAGNOSIS — I428 Other cardiomyopathies: Secondary | ICD-10-CM | POA: Diagnosis not present

## 2018-12-01 DIAGNOSIS — Z299 Encounter for prophylactic measures, unspecified: Secondary | ICD-10-CM | POA: Diagnosis not present

## 2018-12-01 DIAGNOSIS — I10 Essential (primary) hypertension: Secondary | ICD-10-CM | POA: Diagnosis not present

## 2018-12-01 DIAGNOSIS — I4891 Unspecified atrial fibrillation: Secondary | ICD-10-CM | POA: Diagnosis not present

## 2018-12-01 DIAGNOSIS — I5022 Chronic systolic (congestive) heart failure: Secondary | ICD-10-CM | POA: Diagnosis not present

## 2018-12-01 DIAGNOSIS — Z6838 Body mass index (BMI) 38.0-38.9, adult: Secondary | ICD-10-CM | POA: Diagnosis not present

## 2018-12-01 DIAGNOSIS — Z789 Other specified health status: Secondary | ICD-10-CM | POA: Diagnosis not present

## 2018-12-01 LAB — HEPATIC FUNCTION PANEL
ALBUMIN: 4.1 g/dL (ref 3.5–5.0)
ALT: 15 U/L (ref 0–44)
AST: 16 U/L (ref 15–41)
Alkaline Phosphatase: 65 U/L (ref 38–126)
Bilirubin, Direct: 0.1 mg/dL (ref 0.0–0.2)
Indirect Bilirubin: 0.6 mg/dL (ref 0.3–0.9)
Total Bilirubin: 0.7 mg/dL (ref 0.3–1.2)
Total Protein: 7.2 g/dL (ref 6.5–8.1)

## 2018-12-01 LAB — TSH: TSH: 2.634 u[IU]/mL (ref 0.350–4.500)

## 2018-12-01 NOTE — Telephone Encounter (Signed)
Wanting to know if he could get some samples of apixaban (ELIQUIS) 5 MG TABS tablet [[id number]]  Till he gets his check next week. He currently has 2 pills left.

## 2018-12-01 NOTE — Telephone Encounter (Signed)
Pt in office requesting samples of Eliquis. Given 2 sample boxes

## 2018-12-01 NOTE — Telephone Encounter (Signed)
Walk In requesting Eliquis samples  Given to pt, 2 bottles, see other phone note

## 2018-12-08 IMAGING — CR DG CHEST 2V
2 series · 2 of 2 positions shown · non-contrast
Comparison: None.

CLINICAL DATA: Status post defibrillator placement

EXAM:
CHEST - 2 VIEW

[chest pa]
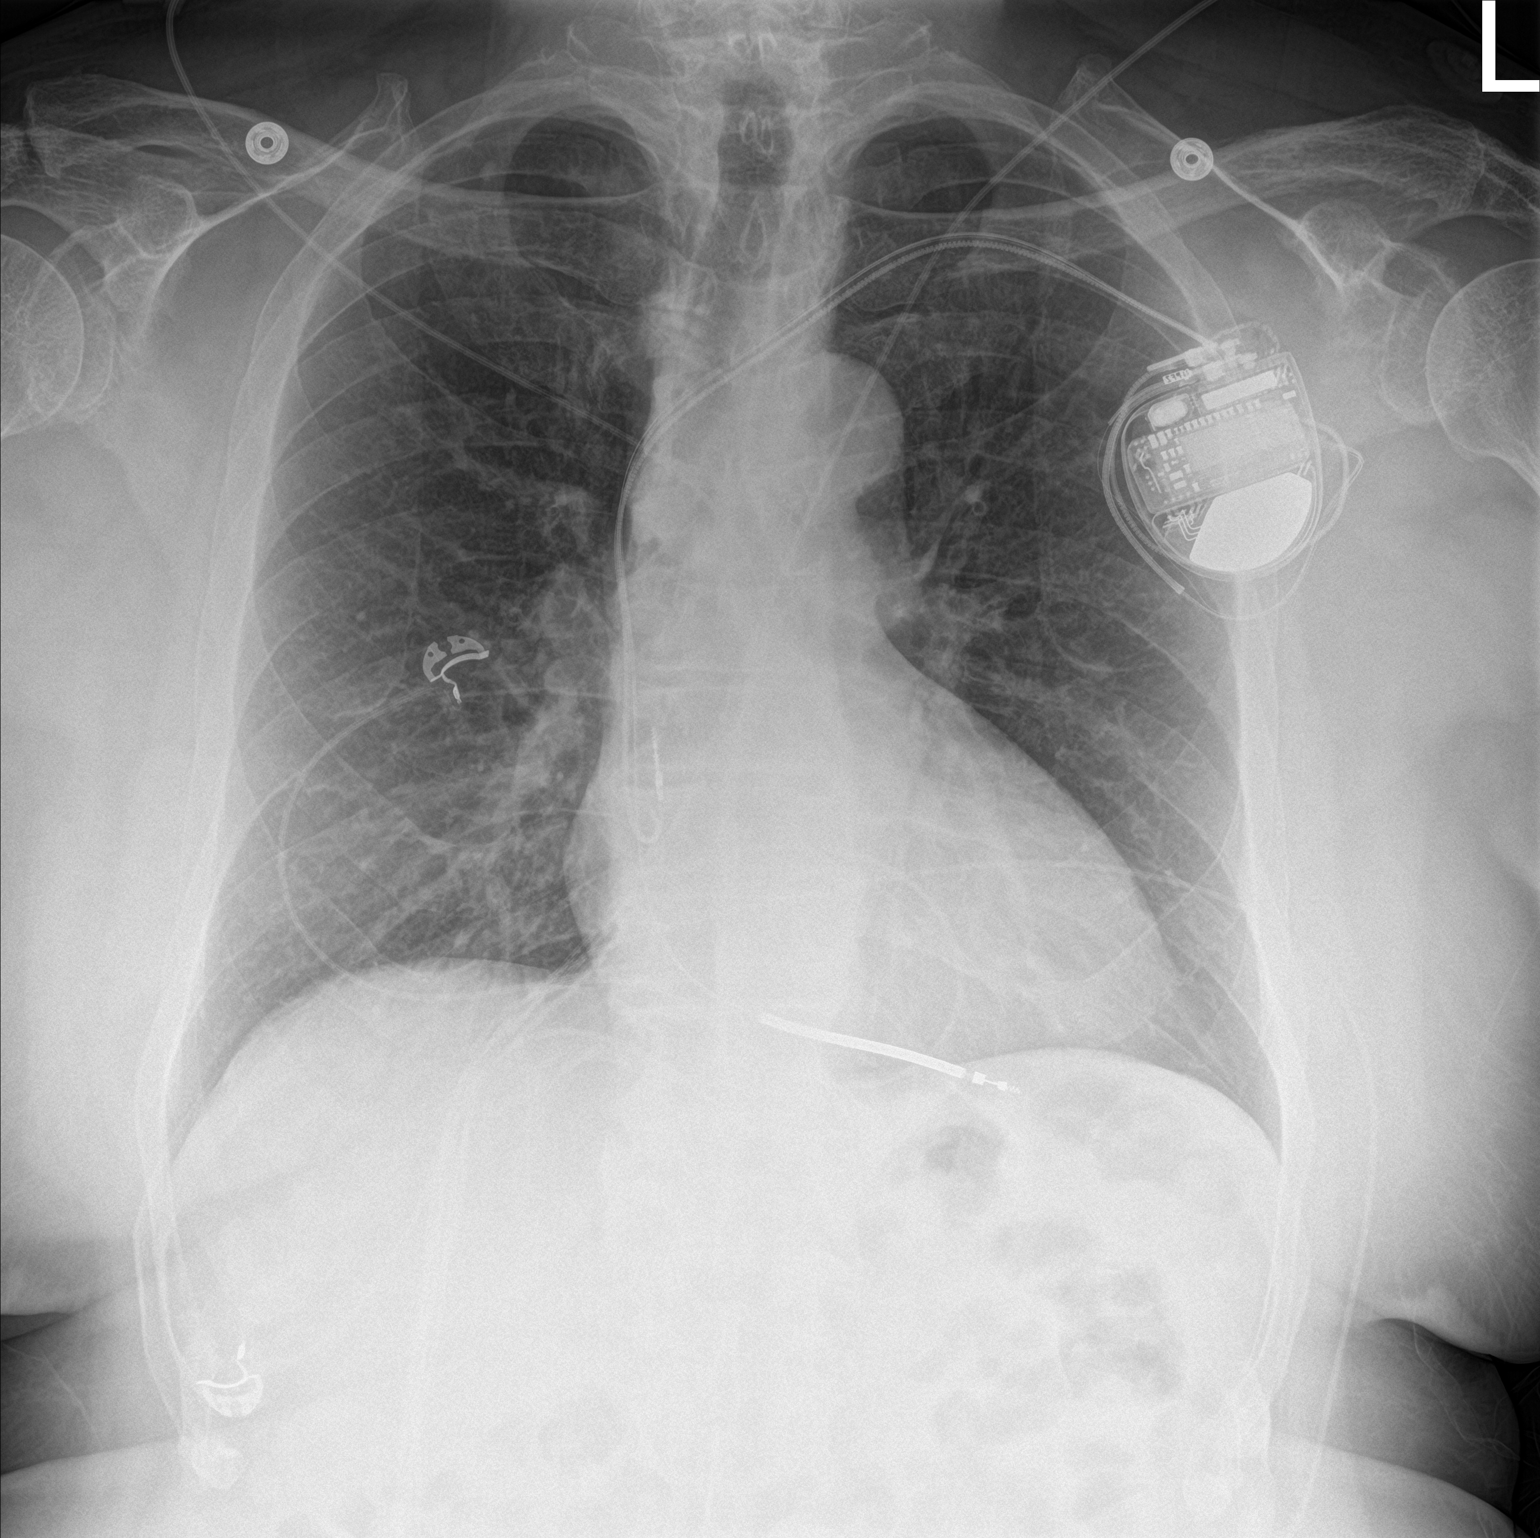

[chest lat]
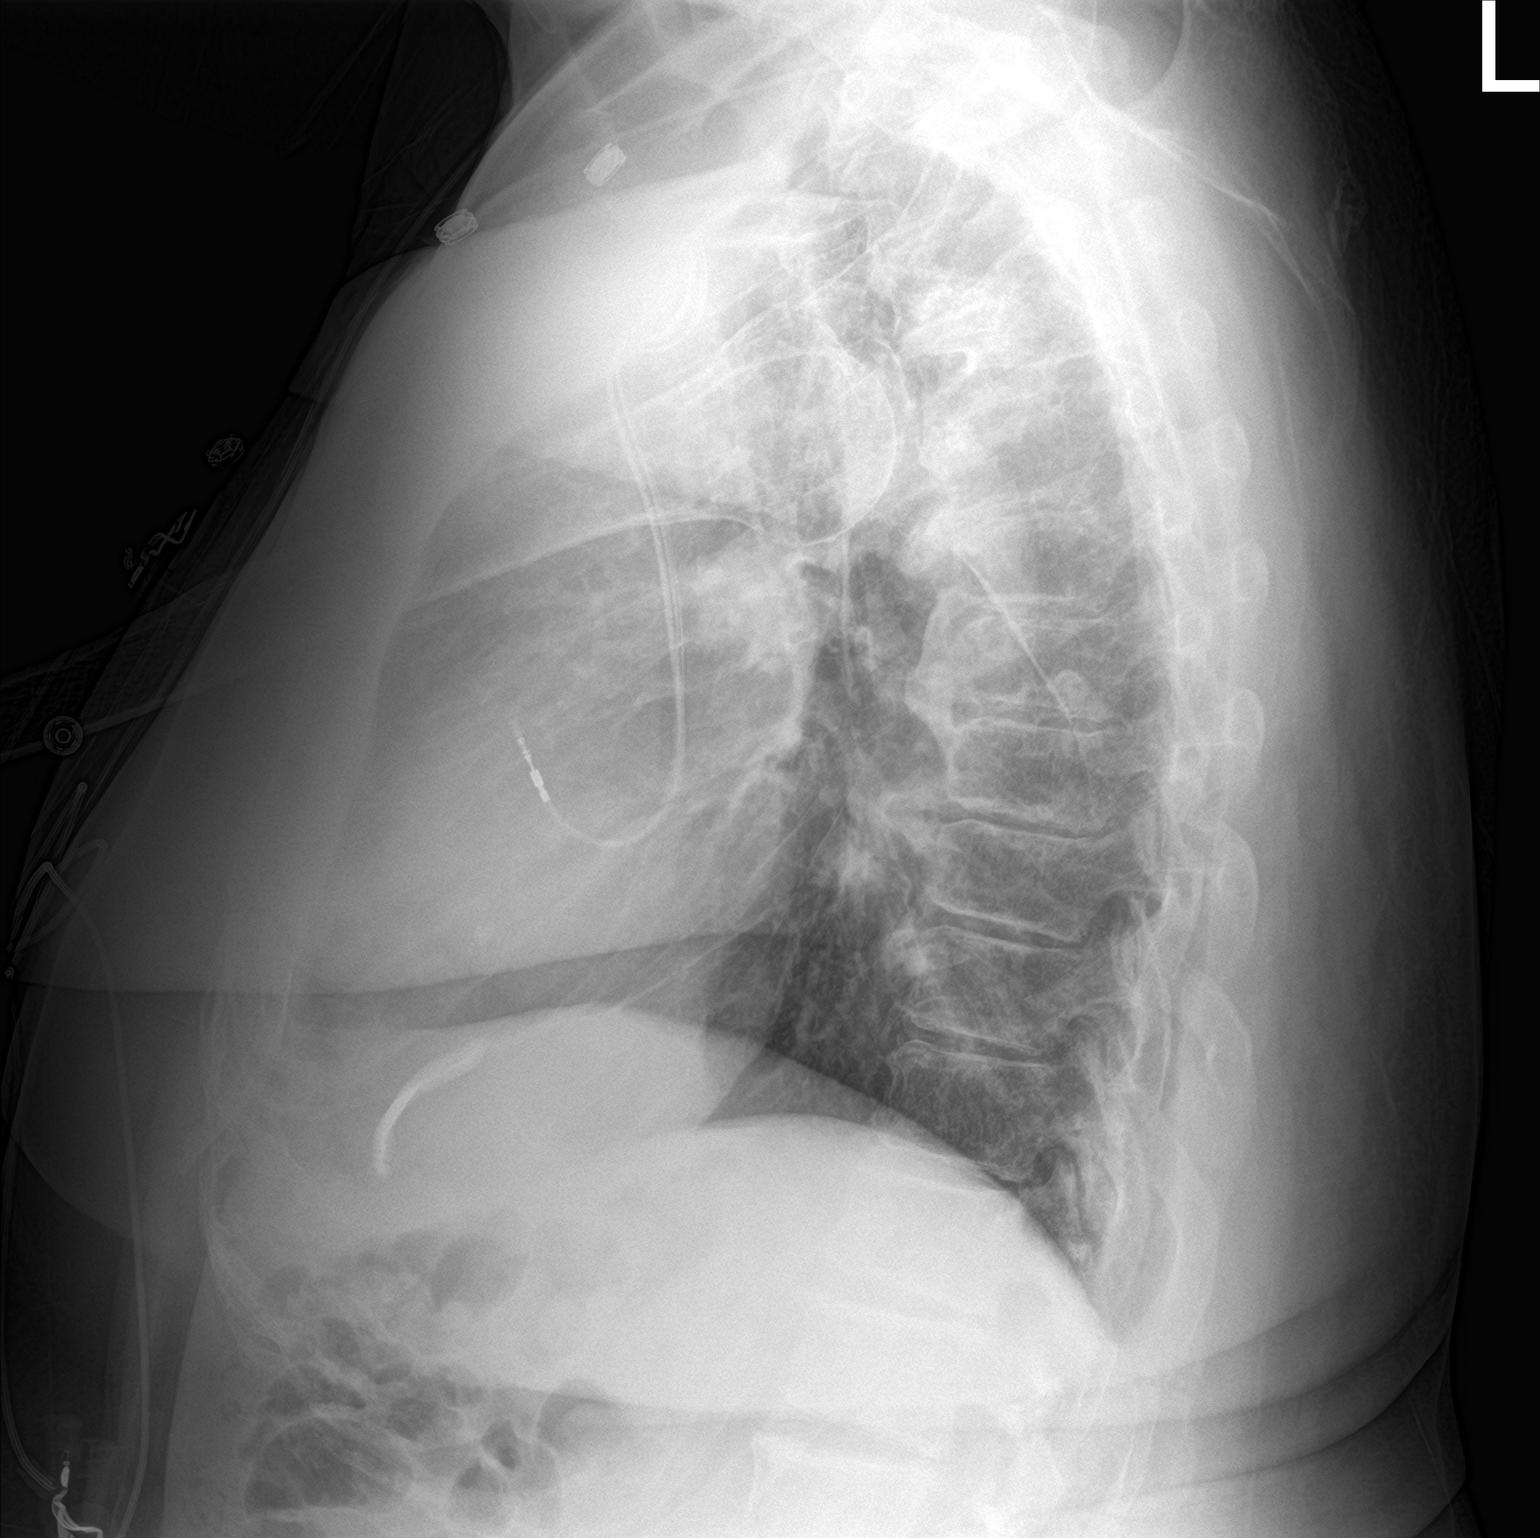

[2 of 2 positions shown; findings below may reference images not displayed]

FINDINGS: Cardiac shadow is within normal limits. Defibrillator is noted. No
pneumothorax is identified. The lungs are well aerated without focal
infiltrate. Degenerative change thoracic spine is noted.
IMPRESSION: No pneumothorax following defibrillator placement

## 2018-12-17 ENCOUNTER — Other Ambulatory Visit: Payer: Self-pay | Admitting: Cardiology

## 2018-12-21 ENCOUNTER — Encounter: Payer: Self-pay | Admitting: Cardiology

## 2018-12-21 ENCOUNTER — Ambulatory Visit (INDEPENDENT_AMBULATORY_CARE_PROVIDER_SITE_OTHER): Payer: Medicare HMO | Admitting: Cardiology

## 2018-12-21 VITALS — BP 160/83 | HR 82 | Ht 73.0 in | Wt 276.0 lb

## 2018-12-21 DIAGNOSIS — I472 Ventricular tachycardia, unspecified: Secondary | ICD-10-CM

## 2018-12-21 DIAGNOSIS — I4891 Unspecified atrial fibrillation: Secondary | ICD-10-CM

## 2018-12-21 DIAGNOSIS — I5022 Chronic systolic (congestive) heart failure: Secondary | ICD-10-CM

## 2018-12-21 MED ORDER — CARVEDILOL 12.5 MG PO TABS: 12.5000 mg | ORAL_TABLET | Freq: Two times a day (BID) | ORAL | 6 refills | Status: DC

## 2018-12-21 NOTE — Patient Instructions (Signed)
Medication Instructions:   Increase Coreg to 12.5mg  twice a day.  Continue all other medications.    Labwork:   Testing/Procedures:   Follow-Up: 3 months   Any Other Special Instructions Will Be Listed Below (If Applicable). Nurse visit in 3 weeks for recheck vitals.  If you need a refill on your cardiac medications before your next appointment, please call your pharmacy.

## 2018-12-21 NOTE — Progress Notes (Signed)
Clinical Summary Mr. Moron is a 68 y.o.male seen for follow up of the following medical problems.   1. Chronic systolic heart failure - 06/2017 echo LVEF 25-30%, diffuse hypokinesis, grade I diastolic dysfunction - 06/2017 cath without obstructive CAD - 11/2017 LVEF 20-25% - AICD followed by Dr Ladona Ridgel   - last visit in 08/2018 we increased his entresto to 97/103mg  bid - no recent SOB/DOE, no recent edema. Has prn lasix.  - compliant with meds, but has not taken yet today - not checking home weights.     2. 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 From clinic note has had VT that terminated with ATP pacing, however had an episode into the VF episode where he was shocked  - no recurrent shocks since starting amiodarone.     3. PAF  - no recent palpitations.Compliant with meds    Past Medical History:  Diagnosis Date  . AICD (automatic cardioverter/defibrillator) present 02/05/2018  . Asthma   . Hypertension      No Known Allergies   Current Outpatient Medications  Medication Sig Dispense Refill  . amiodarone (PACERONE) 200 MG tablet Take 200 mg Two Times Daily until February 15. Then Take 200 mg Daily. 90 tablet 3  . atorvastatin (LIPITOR) 40 MG tablet TAKE ONE TABLET BY MOUTH DAILY. 90 tablet 3  . carvedilol (COREG) 6.25 MG tablet TAKE 1 AND 1/2 TABLETS BY MOUTH TWICE DAILY. 270 tablet 3  . ELIQUIS 5 MG TABS tablet TAKE ONE TABLET BY MOUTH TWICE DAILY. 60 tablet 3  . furosemide (LASIX) 40 MG tablet Take 1 tablet (40 mg total) by mouth daily as needed. Take for weight gain of 3 lbs or more 90 tablet 3  . OVER THE COUNTER MEDICATION Take 2 tablets by mouth at bedtime. Pain Relief Tablets    . sacubitril-valsartan (ENTRESTO) 97-103 MG Take 1 tablet by mouth 2 (two) times daily. 60 tablet 3  . spironolactone (ALDACTONE) 25 MG tablet TAKE ONE TABLET BY MOUTH DAILY. 90 tablet 3   No current facility-administered  medications for this visit.      Past Surgical History:  Procedure Laterality Date  . ICD IMPLANT N/A 02/05/2018   Procedure: ICD IMPLANT;  Surgeon: Marinus Maw, MD;  Location: [facility] INVASIVE CV LAB;  Service: Cardiovascular;  Laterality: N/A;  . LEFT HEART CATH AND CORONARY ANGIOGRAPHY N/A 07/01/2017   Procedure: LEFT HEART CATH AND CORONARY ANGIOGRAPHY;  Surgeon: Corky Crafts, MD;  Location: [facility] INVASIVE CV LAB;  Service: Cardiovascular;  Laterality: N/A;     No Known Allergies    Family History  Problem Relation Age of Onset  . Alzheimer's disease Mother   . Cancer Father      Social History Mr. Konrad reports that he has never smoked. He has never used smokeless tobacco. Mr. Salom reports no history of alcohol use.   Review of Systems CONSTITUTIONAL: No weight loss, fever, chills, weakness or fatigue.  HEENT: Eyes: No visual loss, blurred vision, double vision or yellow sclerae.No hearing loss, sneezing, congestion, runny nose or sore throat.  SKIN: No rash or itching.  CARDIOVASCULAR: per hpi RESPIRATORY: No shortness of breath, cough or sputum.  GASTROINTESTINAL: No anorexia, nausea, vomiting or diarrhea. No abdominal pain or blood.  GENITOURINARY: No burning on urination, no polyuria NEUROLOGICAL: No headache, dizziness, syncope, paralysis, ataxia, numbness or tingling in the extremities. No change in bowel or bladder control.  MUSCULOSKELETAL:  No muscle, back pain, joint pain or stiffness.  LYMPHATICS: No enlarged nodes. No history of splenectomy.  PSYCHIATRIC: No history of depression or anxiety.  ENDOCRINOLOGIC: No reports of sweating, cold or heat intolerance. No polyuria or polydipsia.  Marland Kitchen   Physical Examination Vitals:   12/21/18 0847  BP: (!) 160/83  Pulse: 82  SpO2: 99%   Vitals:   12/21/18 0847  Weight: 276 lb (125.2 kg)  Height: 6\' 1"  (1.854 m)    Gen: resting comfortably, no acute distress HEENT: no scleral icterus, pupils equal  round and reactive, no palptable cervical adenopathy,  CV: RRR, no m/r/g, no jvd Resp: Clear to auscultation bilaterally GI: abdomen is soft, non-tender, non-distended, normal bowel sounds, no hepatosplenomegaly MSK: extremities are warm, no edema.  Skin: warm, no rash Neuro:  no focal deficits Psych: appropriate affect   Diagnostic Studies     Assessment and Plan  1. Chronic systolic HF -no recent symptoms, he is euvolemic - we will increase coreg to 12.5mg  bid. Nursing visit in 3 weeks, would increase coreg to 25mg  bid after pending his vitals   2. NSVT/VT - has ICD in place, started on amiodarone recently be EP - no recent symptoms   3. Afib - no sumptoms, continue current meds   F/u 3 months. Nursing visit 3 weeks for vitals check, likely increase coreg further after      Antoine Poche, M.D.

## 2019-01-11 ENCOUNTER — Ambulatory Visit (INDEPENDENT_AMBULATORY_CARE_PROVIDER_SITE_OTHER): Payer: Medicare HMO | Admitting: *Deleted

## 2019-01-11 VITALS — BP 126/80 | HR 68 | Ht 73.0 in | Wt 278.0 lb

## 2019-01-11 DIAGNOSIS — I10 Essential (primary) hypertension: Secondary | ICD-10-CM | POA: Diagnosis not present

## 2019-01-11 NOTE — Patient Instructions (Signed)
Continue same plan and follow up. No changes

## 2019-01-11 NOTE — Progress Notes (Signed)
Presents to office today for nurse visit to have vital signs rechecked. Patient has taken all doses of medications as prescribed without side effects. Denies dizziness, chest pain or sob. Medications reconciled.

## 2019-01-13 ENCOUNTER — Other Ambulatory Visit: Payer: Self-pay | Admitting: Cardiology

## 2019-02-08 ENCOUNTER — Ambulatory Visit (INDEPENDENT_AMBULATORY_CARE_PROVIDER_SITE_OTHER): Payer: Medicare HMO | Admitting: *Deleted

## 2019-02-08 ENCOUNTER — Other Ambulatory Visit: Payer: Self-pay

## 2019-02-08 DIAGNOSIS — I5022 Chronic systolic (congestive) heart failure: Secondary | ICD-10-CM

## 2019-02-08 DIAGNOSIS — I472 Ventricular tachycardia, unspecified: Secondary | ICD-10-CM

## 2019-02-08 LAB — CUP PACEART REMOTE DEVICE CHECK
Battery Remaining Longevity: 111 mo
Battery Voltage: 2.99 V
Brady Statistic AP VP Percent: 0.25 %
Brady Statistic AP VS Percent: 84.87 %
Brady Statistic AS VP Percent: 0.02 %
Brady Statistic AS VS Percent: 14.86 %
Brady Statistic RA Percent Paced: 81.06 %
Brady Statistic RV Percent Paced: 0.31 %
Date Time Interrogation Session: 20200413062704
HighPow Impedance: 68 Ohm
Implantable Lead Implant Date: 20190411
Implantable Lead Implant Date: 20190411
Implantable Lead Location: 753859
Implantable Lead Location: 753860
Implantable Lead Model: 5076
Implantable Lead Model: 6935
Implantable Pulse Generator Implant Date: 20190411
Lead Channel Impedance Value: 361 Ohm
Lead Channel Impedance Value: 456 Ohm
Lead Channel Impedance Value: 513 Ohm
Lead Channel Pacing Threshold Amplitude: 0.625 V
Lead Channel Pacing Threshold Amplitude: 0.625 V
Lead Channel Pacing Threshold Pulse Width: 0.4 ms
Lead Channel Pacing Threshold Pulse Width: 0.4 ms
Lead Channel Sensing Intrinsic Amplitude: 3 mV
Lead Channel Sensing Intrinsic Amplitude: 3 mV
Lead Channel Sensing Intrinsic Amplitude: 7.375 mV
Lead Channel Sensing Intrinsic Amplitude: 7.375 mV
Lead Channel Setting Pacing Amplitude: 2 V
Lead Channel Setting Pacing Amplitude: 2.5 V
Lead Channel Setting Pacing Pulse Width: 0.4 ms
Lead Channel Setting Sensing Sensitivity: 0.3 mV

## 2019-02-18 NOTE — Progress Notes (Signed)
Remote ICD transmission.   

## 2019-03-11 DIAGNOSIS — I5022 Chronic systolic (congestive) heart failure: Secondary | ICD-10-CM | POA: Diagnosis not present

## 2019-03-11 DIAGNOSIS — I4891 Unspecified atrial fibrillation: Secondary | ICD-10-CM | POA: Diagnosis not present

## 2019-03-11 DIAGNOSIS — Z6839 Body mass index (BMI) 39.0-39.9, adult: Secondary | ICD-10-CM | POA: Diagnosis not present

## 2019-03-11 DIAGNOSIS — B079 Viral wart, unspecified: Secondary | ICD-10-CM | POA: Diagnosis not present

## 2019-03-11 DIAGNOSIS — Z299 Encounter for prophylactic measures, unspecified: Secondary | ICD-10-CM | POA: Diagnosis not present

## 2019-03-11 DIAGNOSIS — I10 Essential (primary) hypertension: Secondary | ICD-10-CM | POA: Diagnosis not present

## 2019-03-26 ENCOUNTER — Telehealth: Payer: Self-pay | Admitting: *Deleted

## 2019-03-26 NOTE — Telephone Encounter (Signed)
Patient verbally consented for telehealth visits with [facility] and understands that his insurance company will be billed for the encounter.   Aware to have vitals available 

## 2019-03-30 ENCOUNTER — Encounter: Payer: Self-pay | Admitting: *Deleted

## 2019-03-30 ENCOUNTER — Encounter: Payer: Self-pay | Admitting: Cardiology

## 2019-03-30 ENCOUNTER — Telehealth (INDEPENDENT_AMBULATORY_CARE_PROVIDER_SITE_OTHER): Payer: Medicare HMO | Admitting: Cardiology

## 2019-03-30 DIAGNOSIS — I5022 Chronic systolic (congestive) heart failure: Secondary | ICD-10-CM | POA: Diagnosis not present

## 2019-03-30 MED ORDER — CARVEDILOL 25 MG PO TABS: 25.0000 mg | ORAL_TABLET | Freq: Two times a day (BID) | ORAL | 6 refills | Status: DC

## 2019-03-30 NOTE — Progress Notes (Signed)
Virtual Visit via Video Note   This visit type was conducted due to national recommendations for restrictions regarding the COVID-19 Pandemic (e.g. social distancing) in an effort to limit this patient's exposure and mitigate transmission in our community.  Due to his co-morbid illnesses, this patient is at least at moderate risk for complications without adequate follow up.  This format is felt to be most appropriate for this patient at this time.  All issues noted in this document were discussed and addressed.  A limited physical exam was performed with this format.  Please refer to the patient's chart for his consent to telehealth for [facility].   Date:  03/30/2019   ID:  Craig Robinson, DOB 1951-05-10, MRN [id number]  Patient Location: Home Provider Location: Office  PCP:  Kirstie Peri, MD  Cardiologist:  Dina Rich, MD  Electrophysiologist:  None   Evaluation Performed:  Follow-Up Visit  Chief Complaint:  3 month follow up  History of Present Illness:    Craig Robinson is a 67 y.o. male seen for follow up of the following medical problems.   1. Chronic systolic heart failure - 06/2017 echo LVEF 25-30%, diffuse hypokinesis, grade I diastolic dysfunction - 06/2017 cath without obstructive CAD - 11/2017 LVEF 20-25% - AICD followed by Dr Ladona Ridgel   - no recent SOb/DOE. No recent edema - compliant with meds   2. 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 From clinic note has had VT that terminated with ATP pacing, however had an episode into the VF episode where he was shocked  -- no recent symptoms. Last device check did show some NSVT  3. PAF - no symptoms, compliant with meds    The patient does not have symptoms concerning for COVID-19 infection (fever, chills, cough, or new shortness of breath).    Past Medical History:  Diagnosis Date  . AICD (automatic cardioverter/defibrillator) present 02/05/2018  .  Asthma   . Hypertension    Past Surgical History:  Procedure Laterality Date  . ICD IMPLANT N/A 02/05/2018   Procedure: ICD IMPLANT;  Surgeon: Marinus Maw, MD;  Location: [facility] INVASIVE CV LAB;  Service: Cardiovascular;  Laterality: N/A;  . LEFT HEART CATH AND CORONARY ANGIOGRAPHY N/A 07/01/2017   Procedure: LEFT HEART CATH AND CORONARY ANGIOGRAPHY;  Surgeon: Corky Crafts, MD;  Location: [facility] INVASIVE CV LAB;  Service: Cardiovascular;  Laterality: N/A;     No outpatient medications have been marked as taking for the 03/30/19 encounter (Appointment) with Antoine Poche, MD.     Allergies:   Patient has no known allergies.   Social History   Tobacco Use  . Smoking status: Never Smoker  . Smokeless tobacco: Never Used  Substance Use Topics  . Alcohol use: No  . Drug use: No     Family Hx: The patient's family history includes Alzheimer's disease in his mother; Cancer in his father.  ROS:   Please see the history of present illness.     All other systems reviewed and are negative.   Prior CV studies:   The following studies were reviewed today:   Labs/Other Tests and Data Reviewed:    EKG:  No ECG reviewed.  Recent Labs: 09/28/2018: BUN 13; Creatinine, Ser 0.75; Magnesium 1.9; Potassium 4.3; Sodium 141 12/01/2018: ALT 15; TSH 2.634   Recent Lipid Panel No results found for: CHOL, TRIG, HDL, CHOLHDL, LDLCALC, LDLDIRECT  Wt Readings from Last 3  Encounters:  01/11/19 278 lb (126.1 kg)  12/21/18 276 lb (125.2 kg)  11/11/18 280 lb (127 kg)     Objective:    Vital Signs:   Today's Vitals   03/30/19 0829  BP: 128/80  Pulse: 68  Weight: 282 lb (127.9 kg)  Height: 6' 1.5" (1.867 m)   Body mass index is 36.7 kg/m.  Well nourished male sitting comfortably in no apparent distress. Normal affect. Normal speech pattern and tone.   ASSESSMENT & PLAN:    1. Chronic systolic HF -no significant symptoms - increase coreg to 25mg  bid   2. NSVT/VT - has ICD  in place, started on amiodarone recently be EP - no recent symptoms, continue to monitor. Some NSVT on recent check, we are increasing his coreg   3. Afib - no symptoms, continue current meds   F/u 3 months  COVID-19 Education: The signs and symptoms of COVID-19 were discussed with the patient and how to seek care for testing (follow up with PCP or arrange E-visit).  The importance of social distancing was discussed today.  Time:   Today, I have spent 12 minutes with the patient with telehealth technology discussing the above problems.     Medication Adjustments/Labs and Tests Ordered: Current medicines are reviewed at length with the patient today.  Concerns regarding medicines are outlined above.   Tests Ordered: No orders of the defined types were placed in this encounter.   Medication Changes: No orders of the defined types were placed in this encounter.   Disposition:  Follow up 3 months  Signed, Dina Rich, MD  03/30/2019 8:08 AM    [facility]

## 2019-03-30 NOTE — Addendum Note (Signed)
Addended by: Lesle Chris on: 03/30/2019 08:59 AM   Modules accepted: Orders

## 2019-03-30 NOTE — Patient Instructions (Signed)
Medication Instructions:   Increase Coreg to 25mg  twice a day.    Continue all other medications.    Labwork: none  Testing/Procedures: none  Follow-Up: 3 months   Any Other Special Instructions Will Be Listed Below (If Applicable).  If you need a refill on your cardiac medications before your next appointment, please call your pharmacy.

## 2019-04-12 ENCOUNTER — Other Ambulatory Visit: Payer: Self-pay | Admitting: Cardiology

## 2019-05-10 ENCOUNTER — Ambulatory Visit (INDEPENDENT_AMBULATORY_CARE_PROVIDER_SITE_OTHER): Payer: Medicare HMO | Admitting: *Deleted

## 2019-05-10 DIAGNOSIS — I5022 Chronic systolic (congestive) heart failure: Secondary | ICD-10-CM

## 2019-05-10 DIAGNOSIS — I428 Other cardiomyopathies: Secondary | ICD-10-CM

## 2019-05-10 LAB — CUP PACEART REMOTE DEVICE CHECK
Battery Remaining Longevity: 106 mo
Battery Voltage: 2.98 V
Brady Statistic AP VP Percent: 0.17 %
Brady Statistic AP VS Percent: 92.38 %
Brady Statistic AS VP Percent: 0 %
Brady Statistic AS VS Percent: 7.45 %
Brady Statistic RA Percent Paced: 89.09 %
Brady Statistic RV Percent Paced: 0.24 %
Date Time Interrogation Session: 20200713083625
HighPow Impedance: 66 Ohm
Implantable Lead Implant Date: 20190411
Implantable Lead Implant Date: 20190411
Implantable Lead Location: 753859
Implantable Lead Location: 753860
Implantable Lead Model: 5076
Implantable Lead Model: 6935
Implantable Pulse Generator Implant Date: 20190411
Lead Channel Impedance Value: 361 Ohm
Lead Channel Impedance Value: 456 Ohm
Lead Channel Impedance Value: 456 Ohm
Lead Channel Pacing Threshold Amplitude: 0.5 V
Lead Channel Pacing Threshold Amplitude: 0.625 V
Lead Channel Pacing Threshold Pulse Width: 0.4 ms
Lead Channel Pacing Threshold Pulse Width: 0.4 ms
Lead Channel Sensing Intrinsic Amplitude: 2.875 mV
Lead Channel Sensing Intrinsic Amplitude: 2.875 mV
Lead Channel Sensing Intrinsic Amplitude: 7.625 mV
Lead Channel Sensing Intrinsic Amplitude: 7.625 mV
Lead Channel Setting Pacing Amplitude: 2 V
Lead Channel Setting Pacing Amplitude: 2.5 V
Lead Channel Setting Pacing Pulse Width: 0.4 ms
Lead Channel Setting Sensing Sensitivity: 0.3 mV

## 2019-05-14 ENCOUNTER — Other Ambulatory Visit: Payer: Self-pay | Admitting: Cardiology

## 2019-05-19 ENCOUNTER — Other Ambulatory Visit: Payer: Self-pay | Admitting: Cardiology

## 2019-05-20 NOTE — Progress Notes (Signed)
Remote ICD transmission.   

## 2019-06-11 DIAGNOSIS — R351 Nocturia: Secondary | ICD-10-CM | POA: Diagnosis not present

## 2019-06-11 DIAGNOSIS — I10 Essential (primary) hypertension: Secondary | ICD-10-CM | POA: Diagnosis not present

## 2019-06-11 DIAGNOSIS — R350 Frequency of micturition: Secondary | ICD-10-CM | POA: Diagnosis not present

## 2019-06-11 DIAGNOSIS — I4891 Unspecified atrial fibrillation: Secondary | ICD-10-CM | POA: Diagnosis not present

## 2019-06-11 DIAGNOSIS — N401 Enlarged prostate with lower urinary tract symptoms: Secondary | ICD-10-CM | POA: Diagnosis not present

## 2019-06-11 DIAGNOSIS — Z6841 Body Mass Index (BMI) 40.0 and over, adult: Secondary | ICD-10-CM | POA: Diagnosis not present

## 2019-06-11 DIAGNOSIS — Z299 Encounter for prophylactic measures, unspecified: Secondary | ICD-10-CM | POA: Diagnosis not present

## 2019-06-28 ENCOUNTER — Other Ambulatory Visit: Payer: Self-pay | Admitting: Internal Medicine

## 2019-06-28 ENCOUNTER — Other Ambulatory Visit: Payer: Self-pay | Admitting: Cardiology

## 2019-07-01 ENCOUNTER — Ambulatory Visit: Payer: Medicare HMO | Admitting: Cardiology

## 2019-07-01 ENCOUNTER — Other Ambulatory Visit: Payer: Self-pay

## 2019-07-01 ENCOUNTER — Encounter: Payer: Self-pay | Admitting: Cardiology

## 2019-07-01 VITALS — BP 98/65 | HR 70 | Ht 73.0 in | Wt 288.2 lb

## 2019-07-01 DIAGNOSIS — I4891 Unspecified atrial fibrillation: Secondary | ICD-10-CM

## 2019-07-01 DIAGNOSIS — I5022 Chronic systolic (congestive) heart failure: Secondary | ICD-10-CM | POA: Diagnosis not present

## 2019-07-01 DIAGNOSIS — I472 Ventricular tachycardia, unspecified: Secondary | ICD-10-CM

## 2019-07-01 MED ORDER — CARVEDILOL 25 MG PO TABS: ORAL_TABLET | ORAL | 6 refills | Status: DC

## 2019-07-01 NOTE — Addendum Note (Signed)
Addended by: Merlene Laughter on: 07/01/2019 03:02 PM   Modules accepted: Orders

## 2019-07-01 NOTE — Progress Notes (Signed)
Clinical Summary Craig Robinson is a 68 y.o.male seen for follow up of the following medical problems.   1. Chronic systolic heart failure - 06/2017 echo LVEF 25-30%, diffuse hypokinesis, grade I diastolic dysfunction - 06/2017 cath without obstructive CAD - 11/2017 LVEF 20-25% - AICD followed by Dr Ladona Ridgel    - last visit we increased coreg to 25mg  bid - in AM after takign pills can feel somewhat sluggish, seems to resolved into the afternoon - no recent edema. No orthopnea.    2. 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 From clinic note has had VT that terminated with ATP pacing, however had an episode into the VF episode where he was shocked   04/2019 normal device check. No recent symptoms  3. PAF - no palpitations - no bleeding issues on eliquis    Past Medical History:  Diagnosis Date  . AICD (automatic cardioverter/defibrillator) present 02/05/2018  . Asthma   . Hypertension      No Known Allergies   Current Outpatient Medications  Medication Sig Dispense Refill  . amiodarone (PACERONE) 200 MG tablet TAKE ONE TABLET BY MOUTH TWICE DAILY UNTIL FEBRUARY 15TH. THEN TAKE ONE TABLET DAILY 90 tablet 1  . atorvastatin (LIPITOR) 40 MG tablet TAKE ONE TABLET BY MOUTH DAILY. 90 tablet 1  . carvedilol (COREG) 25 MG tablet Take 1 tablet (25 mg total) by mouth 2 (two) times daily. 60 tablet 6  . ELIQUIS 5 MG TABS tablet TAKE ONE TABLET BY MOUTH TWICE DAILY. 60 tablet 11  . ENTRESTO 97-103 MG TAKE ONE TABLET BY MOUTH TWICE DAILY. 60 tablet 6  . furosemide (LASIX) 40 MG tablet Take 1 tablet (40 mg total) by mouth daily as needed. Take for weight gain of 3 lbs or more 90 tablet 3  . OVER THE COUNTER MEDICATION Take 2 tablets by mouth at bedtime. Pain Relief Tablets    . spironolactone (ALDACTONE) 25 MG tablet TAKE ONE TABLET BY MOUTH DAILY. 90 tablet 3   No current facility-administered medications for this visit.      Past  Surgical History:  Procedure Laterality Date  . ICD IMPLANT N/A 02/05/2018   Procedure: ICD IMPLANT;  Surgeon: Marinus Maw, MD;  Location: [facility] INVASIVE CV LAB;  Service: Cardiovascular;  Laterality: N/A;  . LEFT HEART CATH AND CORONARY ANGIOGRAPHY N/A 07/01/2017   Procedure: LEFT HEART CATH AND CORONARY ANGIOGRAPHY;  Surgeon: Corky Crafts, MD;  Location: [facility] INVASIVE CV LAB;  Service: Cardiovascular;  Laterality: N/A;     No Known Allergies    Family History  Problem Relation Age of Onset  . Alzheimer's disease Mother   . Cancer Father      Social History Mr. Botha reports that he has never smoked. He has never used smokeless tobacco. Mr. Mcaulay reports no history of alcohol use.   Review of Systems CONSTITUTIONAL: No weight loss, fever, chills, weakness or fatigue.  HEENT: Eyes: No visual loss, blurred vision, double vision or yellow sclerae.No hearing loss, sneezing, congestion, runny nose or sore throat.  SKIN: No rash or itching.  CARDIOVASCULAR: per hpi RESPIRATORY: No shortness of breath, cough or sputum.  GASTROINTESTINAL: No anorexia, nausea, vomiting or diarrhea. No abdominal pain or blood.  GENITOURINARY: No burning on urination, no polyuria NEUROLOGICAL: No headache, dizziness, syncope, paralysis, ataxia, numbness or tingling in the extremities. No change in bowel or bladder control.  MUSCULOSKELETAL: No muscle, back pain, joint pain  or stiffness.  LYMPHATICS: No enlarged nodes. No history of splenectomy.  PSYCHIATRIC: No history of depression or anxiety.  ENDOCRINOLOGIC: No reports of sweating, cold or heat intolerance. No polyuria or polydipsia.  Marland Kitchen   Physical Examination Today's Vitals   07/01/19 0903  BP: 98/65  Pulse: 70  SpO2: 98%  Weight: 288 lb 3.2 oz (130.7 kg)  Height: 6\' 1"  (1.854 m)   Body mass index is 38.02 kg/m.  Gen: resting comfortably, no acute distress HEENT: no scleral icterus, pupils equal round and reactive, no  palptable cervical adenopathy,  CV: RRR, no m/r/g, no jvd Resp: Clear to auscultation bilaterally GI: abdomen is soft, non-tender, non-distended, normal bowel sounds, no hepatosplenomegaly MSK: extremities are warm, no edema.  Skin: warm, no rash Neuro:  no focal deficits Psych: appropriate affect     Assessment and Plan  1. Chronic systolic HF - some AM fatigue after taking meds since we increased his coreg, lower AM dose to 12.5mg  and keep pm dose at 25mg   - no current CHF symptoms, appears euvolemic   2. NSVT/VT - has ICD in place,started on amiodarone by EP - no symptoms, continue current therapty EKG today shows atrial paced/Vsensed rhyth,   3. Afib - no recent symptoms, continue current meds including anticoag  F/u 4 months    Arnoldo Lenis, M.D

## 2019-07-01 NOTE — Patient Instructions (Signed)
Your physician wants you to follow-up in: [place] will receive a reminder letter in the mail two months in advance. If you don't receive a letter, please call our office to schedule the follow-up appointment.  Your physician has recommended you make the following change in your medication:   CHANGE COREG 12.5 MG (1/2 TABLET) AND 25 MG IN THE EVENING  Thank you for choosing [facility]!!

## 2019-08-10 ENCOUNTER — Ambulatory Visit (INDEPENDENT_AMBULATORY_CARE_PROVIDER_SITE_OTHER): Payer: Medicare HMO | Admitting: *Deleted

## 2019-08-10 DIAGNOSIS — I5022 Chronic systolic (congestive) heart failure: Secondary | ICD-10-CM

## 2019-08-10 DIAGNOSIS — I428 Other cardiomyopathies: Secondary | ICD-10-CM

## 2019-08-11 ENCOUNTER — Telehealth: Payer: Self-pay | Admitting: Emergency Medicine

## 2019-08-11 LAB — CUP PACEART REMOTE DEVICE CHECK
Battery Remaining Longevity: 103 mo
Battery Voltage: 2.98 V
Brady Statistic AP VP Percent: 0.28 %
Brady Statistic AP VS Percent: 95.33 %
Brady Statistic AS VP Percent: 0 %
Brady Statistic AS VS Percent: 4.39 %
Brady Statistic RA Percent Paced: 93.97 %
Brady Statistic RV Percent Paced: 0.43 %
Date Time Interrogation Session: 20201013083825
HighPow Impedance: 65 Ohm
Implantable Lead Implant Date: 20190411
Implantable Lead Implant Date: 20190411
Implantable Lead Location: 753859
Implantable Lead Location: 753860
Implantable Lead Model: 5076
Implantable Lead Model: 6935
Implantable Pulse Generator Implant Date: 20190411
Lead Channel Impedance Value: 361 Ohm
Lead Channel Impedance Value: 456 Ohm
Lead Channel Impedance Value: 456 Ohm
Lead Channel Pacing Threshold Amplitude: 0.5 V
Lead Channel Pacing Threshold Amplitude: 0.625 V
Lead Channel Pacing Threshold Pulse Width: 0.4 ms
Lead Channel Pacing Threshold Pulse Width: 0.4 ms
Lead Channel Sensing Intrinsic Amplitude: 2.625 mV
Lead Channel Sensing Intrinsic Amplitude: 2.625 mV
Lead Channel Sensing Intrinsic Amplitude: 7.375 mV
Lead Channel Sensing Intrinsic Amplitude: 7.375 mV
Lead Channel Setting Pacing Amplitude: 2 V
Lead Channel Setting Pacing Amplitude: 2.5 V
Lead Channel Setting Pacing Pulse Width: 0.4 ms
Lead Channel Setting Sensing Sensitivity: 0.3 mV

## 2019-08-11 NOTE — Telephone Encounter (Signed)
Opened in error

## 2019-08-19 ENCOUNTER — Encounter: Payer: Self-pay | Admitting: Internal Medicine

## 2019-08-19 ENCOUNTER — Ambulatory Visit (INDEPENDENT_AMBULATORY_CARE_PROVIDER_SITE_OTHER): Payer: Medicare HMO | Admitting: Internal Medicine

## 2019-08-19 ENCOUNTER — Other Ambulatory Visit: Payer: Self-pay

## 2019-08-19 VITALS — BP 122/66 | HR 64 | Temp 97.3°F | Ht 73.5 in | Wt 296.0 lb

## 2019-08-19 DIAGNOSIS — Z95810 Presence of automatic (implantable) cardiac defibrillator: Secondary | ICD-10-CM | POA: Diagnosis not present

## 2019-08-19 DIAGNOSIS — I472 Ventricular tachycardia, unspecified: Secondary | ICD-10-CM

## 2019-08-19 DIAGNOSIS — I428 Other cardiomyopathies: Secondary | ICD-10-CM

## 2019-08-19 DIAGNOSIS — I5022 Chronic systolic (congestive) heart failure: Secondary | ICD-10-CM | POA: Diagnosis not present

## 2019-08-19 LAB — CUP PACEART INCLINIC DEVICE CHECK
Battery Remaining Longevity: 103 mo
Battery Voltage: 3 V
Brady Statistic AP VP Percent: 0.23 %
Brady Statistic AP VS Percent: 90.98 %
Brady Statistic AS VP Percent: 0.01 %
Brady Statistic AS VS Percent: 8.79 %
Brady Statistic RA Percent Paced: 88.18 %
Brady Statistic RV Percent Paced: 0.32 %
Date Time Interrogation Session: 20201022120942
HighPow Impedance: 68 Ohm
Implantable Lead Implant Date: 20190411
Implantable Lead Implant Date: 20190411
Implantable Lead Location: 753859
Implantable Lead Location: 753860
Implantable Lead Model: 5076
Implantable Lead Model: 6935
Implantable Pulse Generator Implant Date: 20190411
Lead Channel Impedance Value: 418 Ohm
Lead Channel Impedance Value: 475 Ohm
Lead Channel Impedance Value: 475 Ohm
Lead Channel Pacing Threshold Amplitude: 0.5 V
Lead Channel Pacing Threshold Amplitude: 0.75 V
Lead Channel Pacing Threshold Pulse Width: 0.4 ms
Lead Channel Pacing Threshold Pulse Width: 0.4 ms
Lead Channel Sensing Intrinsic Amplitude: 10.75 mV
Lead Channel Sensing Intrinsic Amplitude: 3.375 mV
Lead Channel Setting Pacing Amplitude: 2 V
Lead Channel Setting Pacing Amplitude: 2.5 V
Lead Channel Setting Pacing Pulse Width: 0.4 ms
Lead Channel Setting Sensing Sensitivity: 0.3 mV

## 2019-08-19 NOTE — Patient Instructions (Signed)
Medication Instructions:  Your physician recommends that you continue on your current medications as directed. Please refer to the Current Medication list given to you today.  Take Lasix daily until you loose 5 lbs.   *If you need a refill on your cardiac medications before your next appointment, please call your pharmacy*  Lab Work: NONE  If you have labs (blood work) drawn today and your tests are completely normal, you will receive your results only by: Marland Kitchen MyChart Message (if you have MyChart) OR . A paper copy in the mail If you have any lab test that is abnormal or we need to change your treatment, we will call you to review the results.  Testing/Procedures: NONE   Follow-Up: At [facility], you and your health needs are our priority.  As part of our continuing mission to provide you with exceptional heart care, we have created designated Provider Care Teams.  These Care Teams include your primary Cardiologist (physician) and Advanced Practice Providers (APPs -  Physician Assistants and Nurse Practitioners) who all work together to provide you with the care you need, when you need it.  Your next appointment:   12 months  The format for your next appointment:   In Person  Provider:   You may see Dr. Lovena Le  or one of the following Advanced Practice Providers on your designated Care Team:    Bernerd Pho, PA-C   Ermalinda Barrios, PA-C    Other Instructions Thank you for choosing [facility]!

## 2019-08-19 NOTE — Progress Notes (Signed)
HPI [name] returns today for followup.  He is a very pleasant 68 year old man with a history of chronic systolic heart failure, ventricular tachycardia, status post ICD insertion, obesity, and hypertension.  In the interim, he has had minimal dyspnea.  He admits to dietary indiscretion with sodium.  He has not lost weight.  He has no significant peripheral edema.  He denies anginal symptoms.  He denies ICD therapies. No Known Allergies   Current Outpatient Medications  Medication Sig Dispense Refill  . amiodarone (PACERONE) 200 MG tablet Take 200 mg by mouth daily.    Marland Kitchen atorvastatin (LIPITOR) 40 MG tablet TAKE ONE TABLET BY MOUTH DAILY. 90 tablet 1  . carvedilol (COREG) 25 MG tablet TAKE 12.5 MG IN THE MORNING AND 25 MG IN THE EVENING 60 tablet 6  . ELIQUIS 5 MG TABS tablet TAKE ONE TABLET BY MOUTH TWICE DAILY. 60 tablet 11  . ENTRESTO 97-103 MG TAKE ONE TABLET BY MOUTH TWICE DAILY. 60 tablet 6  . OVER THE COUNTER MEDICATION Take 2 tablets by mouth at bedtime. Pain Relief Tablets    . spironolactone (ALDACTONE) 25 MG tablet TAKE ONE TABLET BY MOUTH DAILY. 90 tablet 3  . tamsulosin (FLOMAX) 0.4 MG CAPS capsule Take 0.4 mg by mouth daily.    . furosemide (LASIX) 40 MG tablet Take 1 tablet (40 mg total) by mouth daily as needed. Take for weight gain of 3 lbs or more 90 tablet 3   No current facility-administered medications for this visit.      Past Medical History:  Diagnosis Date  . AICD (automatic cardioverter/defibrillator) present 02/05/2018  . Asthma   . Hypertension     ROS:   All systems reviewed and negative except as noted in the HPI.   Past Surgical History:  Procedure Laterality Date  . ICD IMPLANT N/A 02/05/2018   Procedure: ICD IMPLANT;  Surgeon: Evans Lance, MD;  Location: [facility];  Service: Cardiovascular;  Laterality: N/A;  . LEFT HEART CATH AND CORONARY ANGIOGRAPHY N/A 07/01/2017   Procedure: LEFT HEART CATH AND CORONARY ANGIOGRAPHY;   Surgeon: Jettie Booze, MD;  Location: [facility];  Service: Cardiovascular;  Laterality: N/A;     Family History  Problem Relation Age of Onset  . Alzheimer's disease Mother   . Cancer Father      Social History   Socioeconomic History  . Marital status: Single    Spouse name: Not on file  . Number of children: Not on file  . Years of education: Not on file  . Highest education level: Not on file  Occupational History  . Not on file  Social Needs  . Financial resource strain: Not on file  . Food insecurity    Worry: Not on file    Inability: Not on file  . Transportation needs    Medical: Not on file    Non-medical: Not on file  Tobacco Use  . Smoking status: Never Smoker  . Smokeless tobacco: Never Used  Substance and Sexual Activity  . Alcohol use: No  . Drug use: No  . Sexual activity: Not Currently    Partners: Female  Lifestyle  . Physical activity    Days per week: Not on file    Minutes per session: Not on file  . Stress: Not on file  Relationships  . Social Herbalist on phone: Not on file    Gets together: Not on  file    Attends religious service: Not on file    Active member of club or organization: Not on file    Attends meetings of clubs or organizations: Not on file    Relationship status: Not on file  . Intimate partner violence    Fear of current or ex partner: Not on file    Emotionally abused: Not on file    Physically abused: Not on file    Forced sexual activity: Not on file  Other Topics Concern  . Not on file  Social History Narrative  . Not on file     BP 122/66   Pulse 64   Temp (!) 97.3 F (36.3 C)   Ht 6' 1.5" (1.867 m)   Wt 296 lb (134.3 kg)   SpO2 97%   BMI 38.52 kg/m   Physical Exam:  Well appearing NAD HEENT: Unremarkable Neck:  No JVD, no thyromegally Lymphatics:  No adenopathy Back:  No CVA tenderness Lungs:  Clear with no wheezes, rales, or rhonchi  HEART:  Regular rate rhythm, no  murmurs, no rubs, no clicks Abd:  soft, positive bowel sounds, no organomegally, no rebound, no guarding Ext:  2 plus pulses, no edema, no cyanosis, no clubbing Skin:  No rashes no nodules Neuro:  CN II through XII intact, motor grossly intact  DEVICE  Normal device function.  See PaceArt for details.   Assess/Plan: 1.  Ventricular tachycardia -he has had no ICD shocks.  He had one episode of sustained monomorphic ventricular tachycardia approximately 10 months ago which was treated successfully with antitachycardic pacing therapy.  He was asymptomatic. 2.  Obesity.  I have encouraged the patient to lose weight. 3.  Chronic systolic heart failure -the patient's symptoms are class II.  His fluid index is elevated today.  I have asked the patient to take Lasix 40 mg daily until he is lost 5 pounds.  I have asked the patient to obtain scales to weigh himself every day.  With regard to his salt intake, he admits to eating bacon almost every day.  I have encouraged him to stop doing this. 4.  ICD -his Medtronic dual-chamber ICD is working normally.  We will recheck in several months.  Craig Bunting, MD

## 2019-08-20 ENCOUNTER — Telehealth: Payer: Self-pay | Admitting: Licensed Clinical Social Worker

## 2019-08-20 NOTE — Progress Notes (Signed)
Remote ICD transmission.   

## 2019-08-20 NOTE — Telephone Encounter (Signed)
CSW referred to assist patient with obtaining a BP cuff. CSW contacted patient to inform cuff will be delivered to home. Patient grateful for support and assistance. CSW available as needed. Jackie Lakoda Mcanany, LCSW, CCSW-MCS [phone]  

## 2019-09-28 ENCOUNTER — Other Ambulatory Visit: Payer: Self-pay | Admitting: Cardiology

## 2019-10-07 ENCOUNTER — Encounter: Payer: Self-pay | Admitting: Cardiology

## 2019-10-07 DIAGNOSIS — Z1211 Encounter for screening for malignant neoplasm of colon: Secondary | ICD-10-CM | POA: Diagnosis not present

## 2019-10-07 DIAGNOSIS — Z125 Encounter for screening for malignant neoplasm of prostate: Secondary | ICD-10-CM | POA: Diagnosis not present

## 2019-10-07 DIAGNOSIS — R5383 Other fatigue: Secondary | ICD-10-CM | POA: Diagnosis not present

## 2019-10-07 DIAGNOSIS — I10 Essential (primary) hypertension: Secondary | ICD-10-CM | POA: Diagnosis not present

## 2019-10-07 DIAGNOSIS — Z7189 Other specified counseling: Secondary | ICD-10-CM | POA: Diagnosis not present

## 2019-10-07 DIAGNOSIS — Z6841 Body Mass Index (BMI) 40.0 and over, adult: Secondary | ICD-10-CM | POA: Diagnosis not present

## 2019-10-07 DIAGNOSIS — Z299 Encounter for prophylactic measures, unspecified: Secondary | ICD-10-CM | POA: Diagnosis not present

## 2019-10-07 DIAGNOSIS — Z0000 Encounter for general adult medical examination without abnormal findings: Secondary | ICD-10-CM | POA: Diagnosis not present

## 2019-10-07 DIAGNOSIS — Z1339 Encounter for screening examination for other mental health and behavioral disorders: Secondary | ICD-10-CM | POA: Diagnosis not present

## 2019-10-07 DIAGNOSIS — Z1331 Encounter for screening for depression: Secondary | ICD-10-CM | POA: Diagnosis not present

## 2019-10-07 DIAGNOSIS — E7800 Pure hypercholesterolemia, unspecified: Secondary | ICD-10-CM | POA: Diagnosis not present

## 2019-10-07 DIAGNOSIS — Z79899 Other long term (current) drug therapy: Secondary | ICD-10-CM | POA: Diagnosis not present

## 2019-11-09 ENCOUNTER — Ambulatory Visit (INDEPENDENT_AMBULATORY_CARE_PROVIDER_SITE_OTHER): Payer: Medicare HMO | Admitting: *Deleted

## 2019-11-09 DIAGNOSIS — I428 Other cardiomyopathies: Secondary | ICD-10-CM

## 2019-11-09 LAB — CUP PACEART REMOTE DEVICE CHECK
Battery Remaining Longevity: 100 mo
Battery Voltage: 2.97 V
Brady Statistic AP VP Percent: 0.05 %
Brady Statistic AP VS Percent: 94.56 %
Brady Statistic AS VP Percent: 0 %
Brady Statistic AS VS Percent: 5.39 %
Brady Statistic RA Percent Paced: 94.27 %
Brady Statistic RV Percent Paced: 0.05 %
Date Time Interrogation Session: 20210112022604
HighPow Impedance: 66 Ohm
Implantable Lead Implant Date: 20190411
Implantable Lead Implant Date: 20190411
Implantable Lead Location: 753859
Implantable Lead Location: 753860
Implantable Lead Model: 5076
Implantable Lead Model: 6935
Implantable Pulse Generator Implant Date: 20190411
Lead Channel Impedance Value: 361 Ohm
Lead Channel Impedance Value: 456 Ohm
Lead Channel Impedance Value: 475 Ohm
Lead Channel Pacing Threshold Amplitude: 0.5 V
Lead Channel Pacing Threshold Amplitude: 0.75 V
Lead Channel Pacing Threshold Pulse Width: 0.4 ms
Lead Channel Pacing Threshold Pulse Width: 0.4 ms
Lead Channel Sensing Intrinsic Amplitude: 3.25 mV
Lead Channel Sensing Intrinsic Amplitude: 3.25 mV
Lead Channel Sensing Intrinsic Amplitude: 6.875 mV
Lead Channel Sensing Intrinsic Amplitude: 6.875 mV
Lead Channel Setting Pacing Amplitude: 2 V
Lead Channel Setting Pacing Amplitude: 2.5 V
Lead Channel Setting Pacing Pulse Width: 0.4 ms
Lead Channel Setting Sensing Sensitivity: 0.3 mV

## 2019-11-18 ENCOUNTER — Encounter: Payer: Self-pay | Admitting: Cardiology

## 2019-11-18 ENCOUNTER — Encounter: Payer: Self-pay | Admitting: *Deleted

## 2019-11-18 ENCOUNTER — Other Ambulatory Visit: Payer: Self-pay

## 2019-11-18 ENCOUNTER — Ambulatory Visit (INDEPENDENT_AMBULATORY_CARE_PROVIDER_SITE_OTHER): Payer: Medicare HMO | Admitting: Cardiology

## 2019-11-18 VITALS — BP 111/73 | HR 79 | Ht 73.0 in | Wt 302.4 lb

## 2019-11-18 DIAGNOSIS — I472 Ventricular tachycardia, unspecified: Secondary | ICD-10-CM

## 2019-11-18 DIAGNOSIS — I5022 Chronic systolic (congestive) heart failure: Secondary | ICD-10-CM | POA: Diagnosis not present

## 2019-11-18 DIAGNOSIS — I4891 Unspecified atrial fibrillation: Secondary | ICD-10-CM | POA: Diagnosis not present

## 2019-11-18 NOTE — Patient Instructions (Signed)
Your physician recommends that you schedule a follow-up appointment in: 4 MONTHS WITH DR BRANCH  Your physician recommends that you continue on your current medications as directed. Please refer to the Current Medication list given to you today.  Thank you for choosing [facility]!!    

## 2019-11-18 NOTE — Progress Notes (Signed)
Clinical Summary Craig Robinson is a 69 y.o.male seen for follow up of the following medical problems.   1. Chronic systolic heart failure - 06/2017 echo LVEF 25-30%, diffuse hypokinesis, grade I diastolic dysfunction - 06/2017 cath without obstructive CAD - 11/2017 LVEF 20-25% - AICD followed by Dr Ladona Ridgel      - last visit due to fatigue we lowered AM coreg to 12.5mg  and kept PM coreg at 25mg . Fatigue has improved - no recent SOB or DOE. No recent edema - compliant with meds. Has prn lasix, has only needed twice since last visit.   2. 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 PAF - no palpitations - no bleeding issues on eliquis   Past Medical History:  Diagnosis Date  . AICD (automatic cardioverter/defibrillator) present 02/05/2018  . Asthma   . Hypertension      No Known Allergies   Current Outpatient Medications  Medication Sig Dispense Refill  . amiodarone (PACERONE) 200 MG tablet Take 200 mg by mouth daily.    04/07/2018 atorvastatin (LIPITOR) 40 MG tablet TAKE ONE TABLET BY MOUTH DAILY. 90 tablet 3  . carvedilol (COREG) 25 MG tablet TAKE 12.5 MG IN THE MORNING AND 25 MG IN THE EVENING 60 tablet 6  . ELIQUIS 5 MG TABS tablet TAKE ONE TABLET BY MOUTH TWICE DAILY. 60 tablet 11  . ENTRESTO 97-103 MG TAKE ONE TABLET BY MOUTH TWICE DAILY. 60 tablet 6  . furosemide (LASIX) 40 MG tablet Take 1 tablet (40 mg total) by mouth daily as needed. Take for weight gain of 3 lbs or more 90 tablet 3  . OVER THE COUNTER MEDICATION Take 2 tablets by mouth at bedtime. Pain Relief Tablets    . spironolactone (ALDACTONE) 25 MG tablet TAKE ONE TABLET BY MOUTH DAILY. 90 tablet 3  . tamsulosin (FLOMAX) 0.4 MG CAPS capsule Take 0.4 mg by mouth daily.     No current facility-administered medications for this visit.     Past Surgical History:  Procedure Laterality Date  . ICD IMPLANT N/A 02/05/2018   Procedure: ICD IMPLANT;  Surgeon: 04/07/2018, MD;  Location: [facility] INVASIVE [facility];  Service: Cardiovascular;  Laterality: N/A;  . LEFT HEART CATH AND CORONARY ANGIOGRAPHY N/A 07/01/2017   Procedure: LEFT HEART CATH AND CORONARY ANGIOGRAPHY;  Surgeon: 08/31/2017, MD;  Location: [facility] INVASIVE CV LAB;  Service: Cardiovascular;  Laterality: N/A;     No Known Allergies    Family History  Problem Relation Age of Onset  . Alzheimer's disease Mother   . Cancer Father      Social History Mr. Gilham reports that he has never smoked. He has never used smokeless tobacco. Mr. Huskins reports no history of alcohol use.   Review of Systems CONSTITUTIONAL: No weight loss, fever, chills, weakness or fatigue.  HEENT: Eyes: No visual loss, blurred vision, double vision or yellow sclerae.No hearing loss, sneezing, congestion, runny nose or sore throat.  SKIN: No rash or itching.  CARDIOVASCULAR: per hpi RESPIRATORY: No shortness of breath, cough or sputum.  GASTROINTESTINAL: No anorexia, nausea, vomiting or diarrhea. No abdominal pain or blood.  GENITOURINARY: No burning on urination, no polyuria NEUROLOGICAL: No headache, dizziness, syncope, paralysis, ataxia, numbness or tingling in the extremities. No change in bowel or bladder control.  MUSCULOSKELETAL: No muscle, back pain, joint pain or stiffness.  LYMPHATICS: No enlarged nodes. No  history of splenectomy.  PSYCHIATRIC: No history of depression or anxiety.  ENDOCRINOLOGIC: No reports of sweating, cold or heat intolerance. No polyuria or polydipsia.  Marland Kitchen   Physical Examination Today's Vitals   11/18/19 1254  BP: 111/73  Pulse: 79  SpO2: 99%  Weight: (!) 302 lb 6.4 oz (137.2 kg)  Height: 6\' 1"  (1.854 m)   Body mass index is 39.9 kg/m.  Gen: resting comfortably, no acute distress HEENT: no scleral icterus, pupils equal round and reactive, no palptable cervical adenopathy,  CV: RRR, no m/r/g, no jvd Resp: Clear to  auscultation bilaterally GI: abdomen is soft, non-tender, non-distended, normal bowel sounds, no hepatosplenomegaly MSK: extremities are warm, no edema.  Skin: warm, no rash Neuro:  no focal deficits Psych: appropriate affect   Assessment and Plan   1. Chronic systolic HF - no recent symptoms. Continue current meds, did not tolerate higher coreg dosing due to fatigue   2. NSVT/VT - has ICD in place,started on amiodarone by EP -no recent symptoms, normal device check recently - continue current therapy   3. Afib - denies any symptoms, conitnue current meds including anticoag   Request pcp labs  F/u 4 months     Arnoldo Lenis, M.D

## 2019-12-09 ENCOUNTER — Ambulatory Visit: Payer: Medicare HMO | Attending: Internal Medicine

## 2019-12-09 ENCOUNTER — Other Ambulatory Visit: Payer: Self-pay

## 2019-12-09 ENCOUNTER — Other Ambulatory Visit: Payer: Self-pay | Admitting: Cardiology

## 2019-12-09 DIAGNOSIS — Z23 Encounter for immunization: Secondary | ICD-10-CM | POA: Insufficient documentation

## 2019-12-26 DIAGNOSIS — I255 Ischemic cardiomyopathy: Secondary | ICD-10-CM | POA: Diagnosis not present

## 2019-12-26 DIAGNOSIS — N401 Enlarged prostate with lower urinary tract symptoms: Secondary | ICD-10-CM | POA: Diagnosis not present

## 2020-01-03 ENCOUNTER — Other Ambulatory Visit: Payer: Self-pay | Admitting: *Deleted

## 2020-01-03 MED ORDER — AMIODARONE HCL 200 MG PO TABS: 200.0000 mg | ORAL_TABLET | Freq: Every day | ORAL | 3 refills | Status: DC

## 2020-01-03 MED ORDER — SPIRONOLACTONE 25 MG PO TABS: 25.0000 mg | ORAL_TABLET | Freq: Every day | ORAL | 1 refills | Status: DC

## 2020-01-03 MED ORDER — CARVEDILOL 25 MG PO TABS: ORAL_TABLET | ORAL | 3 refills | Status: DC

## 2020-01-03 MED ORDER — APIXABAN 5 MG PO TABS: 5.0000 mg | ORAL_TABLET | Freq: Two times a day (BID) | ORAL | 1 refills | Status: DC

## 2020-01-03 MED ORDER — ATORVASTATIN CALCIUM 40 MG PO TABS: 40.0000 mg | ORAL_TABLET | Freq: Every day | ORAL | 3 refills | Status: DC

## 2020-01-10 ENCOUNTER — Ambulatory Visit: Payer: Medicare HMO | Attending: Internal Medicine

## 2020-01-10 DIAGNOSIS — Z23 Encounter for immunization: Secondary | ICD-10-CM

## 2020-01-10 NOTE — Progress Notes (Signed)
   Covid-19 Vaccination Clinic  Name:  Craig Robinson    MRN: [id number] DOB: 1951-06-10  01/10/2020  [name] was observed post Covid-19 immunization for 30 minutes based on pre-vaccination screening without incident. He was provided with Vaccine Information Sheet and instruction to access the V-Safe system.   [name] was instructed to call 911 with any severe reactions post vaccine: Marland Kitchen Difficulty breathing  . Swelling of face and throat  . A fast heartbeat  . A bad rash all over body  . Dizziness and weakness   Immunizations Administered    Name Date Dose VIS Date Route   Moderna COVID-19 Vaccine 01/10/2020  9:07 AM 0.5 mL 09/28/2019 Intramuscular   Manufacturer: Moderna   Lot: [id number]   NDC: 22567-209-19

## 2020-02-08 ENCOUNTER — Ambulatory Visit (INDEPENDENT_AMBULATORY_CARE_PROVIDER_SITE_OTHER): Payer: Medicare HMO | Admitting: *Deleted

## 2020-02-08 DIAGNOSIS — H2513 Age-related nuclear cataract, bilateral: Secondary | ICD-10-CM | POA: Diagnosis not present

## 2020-02-08 DIAGNOSIS — H5210 Myopia, unspecified eye: Secondary | ICD-10-CM | POA: Diagnosis not present

## 2020-02-08 DIAGNOSIS — R351 Nocturia: Secondary | ICD-10-CM | POA: Diagnosis not present

## 2020-02-08 DIAGNOSIS — Z87891 Personal history of nicotine dependence: Secondary | ICD-10-CM | POA: Diagnosis not present

## 2020-02-08 DIAGNOSIS — I428 Other cardiomyopathies: Secondary | ICD-10-CM

## 2020-02-08 DIAGNOSIS — I10 Essential (primary) hypertension: Secondary | ICD-10-CM | POA: Diagnosis not present

## 2020-02-08 DIAGNOSIS — N401 Enlarged prostate with lower urinary tract symptoms: Secondary | ICD-10-CM | POA: Diagnosis not present

## 2020-02-08 DIAGNOSIS — I5022 Chronic systolic (congestive) heart failure: Secondary | ICD-10-CM | POA: Diagnosis not present

## 2020-02-08 DIAGNOSIS — Z299 Encounter for prophylactic measures, unspecified: Secondary | ICD-10-CM | POA: Diagnosis not present

## 2020-02-08 DIAGNOSIS — Z6841 Body Mass Index (BMI) 40.0 and over, adult: Secondary | ICD-10-CM | POA: Diagnosis not present

## 2020-02-08 LAB — CUP PACEART REMOTE DEVICE CHECK
Battery Remaining Longevity: 96 mo
Battery Voltage: 2.96 V
Brady Statistic AP VP Percent: 0.05 %
Brady Statistic AP VS Percent: 94.49 %
Brady Statistic AS VP Percent: 0 %
Brady Statistic AS VS Percent: 5.45 %
Brady Statistic RA Percent Paced: 94.08 %
Brady Statistic RV Percent Paced: 0.06 %
Date Time Interrogation Session: 20210413031804
HighPow Impedance: 73 Ohm
Implantable Lead Implant Date: 20190411
Implantable Lead Implant Date: 20190411
Implantable Lead Location: 753859
Implantable Lead Location: 753860
Implantable Lead Model: 5076
Implantable Lead Model: 6935
Implantable Pulse Generator Implant Date: 20190411
Lead Channel Impedance Value: 361 Ohm
Lead Channel Impedance Value: 456 Ohm
Lead Channel Impedance Value: 475 Ohm
Lead Channel Pacing Threshold Amplitude: 0.5 V
Lead Channel Pacing Threshold Amplitude: 0.75 V
Lead Channel Pacing Threshold Pulse Width: 0.4 ms
Lead Channel Pacing Threshold Pulse Width: 0.4 ms
Lead Channel Sensing Intrinsic Amplitude: 2.875 mV
Lead Channel Sensing Intrinsic Amplitude: 2.875 mV
Lead Channel Sensing Intrinsic Amplitude: 6.5 mV
Lead Channel Sensing Intrinsic Amplitude: 6.5 mV
Lead Channel Setting Pacing Amplitude: 2 V
Lead Channel Setting Pacing Amplitude: 2.5 V
Lead Channel Setting Pacing Pulse Width: 0.4 ms
Lead Channel Setting Sensing Sensitivity: 0.3 mV

## 2020-02-09 DIAGNOSIS — Z0100 Encounter for examination of eyes and vision without abnormal findings: Secondary | ICD-10-CM | POA: Diagnosis not present

## 2020-02-09 NOTE — Progress Notes (Signed)
ICD Remote  

## 2020-02-25 DIAGNOSIS — I10 Essential (primary) hypertension: Secondary | ICD-10-CM | POA: Diagnosis not present

## 2020-02-25 DIAGNOSIS — I255 Ischemic cardiomyopathy: Secondary | ICD-10-CM | POA: Diagnosis not present

## 2020-03-24 ENCOUNTER — Ambulatory Visit: Payer: Medicare HMO | Admitting: Cardiology

## 2020-04-06 ENCOUNTER — Encounter: Payer: Self-pay | Admitting: *Deleted

## 2020-04-06 ENCOUNTER — Encounter: Payer: Self-pay | Admitting: Cardiology

## 2020-04-06 ENCOUNTER — Ambulatory Visit: Payer: Medicare HMO | Admitting: Cardiology

## 2020-04-06 ENCOUNTER — Other Ambulatory Visit: Payer: Self-pay

## 2020-04-06 VITALS — BP 100/70 | HR 81 | Ht 73.0 in | Wt 302.6 lb

## 2020-04-06 DIAGNOSIS — I4891 Unspecified atrial fibrillation: Secondary | ICD-10-CM | POA: Diagnosis not present

## 2020-04-06 DIAGNOSIS — I5022 Chronic systolic (congestive) heart failure: Secondary | ICD-10-CM | POA: Diagnosis not present

## 2020-04-06 DIAGNOSIS — I472 Ventricular tachycardia, unspecified: Secondary | ICD-10-CM

## 2020-04-06 NOTE — Patient Instructions (Signed)
Your physician recommends that you schedule a follow-up appointment in: 4 MONTHS WITH DR BRANCH  Your physician recommends that you continue on your current medications as directed. Please refer to the Current Medication list given to you today.  Thank you for choosing [facility]!!    

## 2020-04-06 NOTE — Progress Notes (Signed)
Clinical Summary Craig Robinson is a 69 y.o.male seen for follow up of the following medical problems.   1. Chronic systolic heart failure - 06/2017 echo LVEF 25-30%, diffuse hypokinesis, grade I diastolic dysfunction - 06/2017 cath without obstructive CAD - 11/2017 LVEF 20-25% - AICD followed by Dr Ladona Ridgel    - no recent SOB/DOE. No recent edema - compliant with meds   2. NSVT/VT - AICD followed by EP - also on amio  - no recent palpitaitons.  - 01/2020 normal device check.    3. PAF - no palpitations - no bleeding on eliquis.    Past Medical History:  Diagnosis Date  . AICD (automatic cardioverter/defibrillator) present 02/05/2018  . Asthma   . Hypertension      No Known Allergies   Current Outpatient Medications  Medication Sig Dispense Refill  . amiodarone (PACERONE) 200 MG tablet Take 1 tablet (200 mg total) by mouth daily. 90 tablet 3  . apixaban (ELIQUIS) 5 MG TABS tablet Take 1 tablet (5 mg total) by mouth 2 (two) times daily. 180 tablet 1  . atorvastatin (LIPITOR) 40 MG tablet Take 1 tablet (40 mg total) by mouth daily. 90 tablet 3  . carvedilol (COREG) 25 MG tablet TAKE 12.5 MG IN THE MORNING AND 25 MG IN THE EVENING 135 tablet 3  . ENTRESTO 97-103 MG TAKE ONE TABLET BY MOUTH TWICE DAILY. 60 tablet 6  . furosemide (LASIX) 40 MG tablet Take 1 tablet (40 mg total) by mouth daily as needed. Take for weight gain of 3 lbs or more 90 tablet 3  . OVER THE COUNTER MEDICATION Take 2 tablets by mouth at bedtime. Pain Relief Tablets    . spironolactone (ALDACTONE) 25 MG tablet Take 1 tablet (25 mg total) by mouth daily. 90 tablet 1  . tamsulosin (FLOMAX) 0.4 MG CAPS capsule Take 0.4 mg by mouth daily.     No current facility-administered medications for this visit.     Past Surgical History:  Procedure Laterality Date  . ICD IMPLANT N/A 02/05/2018   Procedure: ICD IMPLANT;  Surgeon: Marinus Maw, MD;  Location: [facility] INVASIVE CV LAB;  Service:  Cardiovascular;  Laterality: N/A;  . LEFT HEART CATH AND CORONARY ANGIOGRAPHY N/A 07/01/2017   Procedure: LEFT HEART CATH AND CORONARY ANGIOGRAPHY;  Surgeon: Corky Crafts, MD;  Location: [facility] INVASIVE CV LAB;  Service: Cardiovascular;  Laterality: N/A;     No Known Allergies    Family History  Problem Relation Age of Onset  . Alzheimer's disease Mother   . Cancer Father      Social History Mr. Dubeau reports that he has never smoked. He has never used smokeless tobacco. Mr. Feldhaus reports no history of alcohol use.   Review of Systems CONSTITUTIONAL: No weight loss, fever, chills, weakness or fatigue.  HEENT: Eyes: No visual loss, blurred vision, double vision or yellow sclerae.No hearing loss, sneezing, congestion, runny nose or sore throat.  SKIN: No rash or itching.  CARDIOVASCULAR: per hpi RESPIRATORY: No shortness of breath, cough or sputum.  GASTROINTESTINAL: No anorexia, nausea, vomiting or diarrhea. No abdominal pain or blood.  GENITOURINARY: No burning on urination, no polyuria NEUROLOGICAL: No headache, dizziness, syncope, paralysis, ataxia, numbness or tingling in the extremities. No change in bowel or bladder control.  MUSCULOSKELETAL: No muscle, back pain, joint pain or stiffness.  LYMPHATICS: No enlarged nodes. No history of splenectomy.  PSYCHIATRIC: No history of depression or anxiety.  ENDOCRINOLOGIC: No reports of sweating, cold  or heat intolerance. No polyuria or polydipsia.  Marland Kitchen   Physical Examination Today's Vitals   04/06/20 1045  BP: 100/70  Pulse: 81  SpO2: 95%  Weight: (!) 302 lb 9.6 oz (137.3 kg)  Height: 6\' 1"  (1.854 m)   Body mass index is 39.92 kg/m.  Gen: resting comfortably, no acute distress HEENT: no scleral icterus, pupils equal round and reactive, no palptable cervical adenopathy,  CV: RRR, no m/r/g, no jvd Resp: Clear to auscultation bilaterally GI: abdomen is soft, non-tender, non-distended, normal bowel sounds, no  hepatosplenomegaly MSK: extremities are warm, no edema.  Skin: warm, no rash Neuro:  no focal deficits Psych: appropriate affect     Assessment and Plan  1. Chronic systolic HF - doing well without symptoms, on maximally titrated regimen, did not tolerate higher coreg doses  2. NSVT/VT - has ICD in place,started on amiodarone byEP -no symptoms, recent normal device check - continue to monitor   3. Afib -no symptoms, continue current meds including anticoag    F/u 4 months   Arnoldo Lenis, M.D

## 2020-04-12 ENCOUNTER — Telehealth: Payer: Self-pay | Admitting: Cardiology

## 2020-04-12 MED ORDER — APIXABAN 5 MG PO TABS: 5.0000 mg | ORAL_TABLET | Freq: Two times a day (BID) | ORAL | 0 refills | Status: DC

## 2020-04-12 NOTE — Telephone Encounter (Signed)
Pt called stating he is out of apixaban (ELIQUIS) 5 MG TABS tablet [[id number]]  And he uses mail order pharmacy so it will not arrive for a few days, would like to know if he could get some samples   Please call [phone]

## 2020-04-12 NOTE — Telephone Encounter (Signed)
Message left on vm that samples are available for pick up.

## 2020-04-26 DIAGNOSIS — I4891 Unspecified atrial fibrillation: Secondary | ICD-10-CM | POA: Diagnosis not present

## 2020-04-26 DIAGNOSIS — I10 Essential (primary) hypertension: Secondary | ICD-10-CM | POA: Diagnosis not present

## 2020-05-09 ENCOUNTER — Ambulatory Visit (INDEPENDENT_AMBULATORY_CARE_PROVIDER_SITE_OTHER): Payer: Medicare HMO | Admitting: *Deleted

## 2020-05-09 DIAGNOSIS — I428 Other cardiomyopathies: Secondary | ICD-10-CM

## 2020-05-09 LAB — CUP PACEART REMOTE DEVICE CHECK
Battery Remaining Longevity: 93 mo
Battery Voltage: 2.95 V
Brady Statistic AP VP Percent: 0.06 %
Brady Statistic AP VS Percent: 93.45 %
Brady Statistic AS VP Percent: 0 %
Brady Statistic AS VS Percent: 6.49 %
Brady Statistic RA Percent Paced: 92.57 %
Brady Statistic RV Percent Paced: 0.07 %
Date Time Interrogation Session: 20210713022603
HighPow Impedance: 71 Ohm
Implantable Lead Implant Date: 20190411
Implantable Lead Implant Date: 20190411
Implantable Lead Location: 753859
Implantable Lead Location: 753860
Implantable Lead Model: 5076
Implantable Lead Model: 6935
Implantable Pulse Generator Implant Date: 20190411
Lead Channel Impedance Value: 399 Ohm
Lead Channel Impedance Value: 475 Ohm
Lead Channel Impedance Value: 513 Ohm
Lead Channel Pacing Threshold Amplitude: 0.5 V
Lead Channel Pacing Threshold Amplitude: 0.75 V
Lead Channel Pacing Threshold Pulse Width: 0.4 ms
Lead Channel Pacing Threshold Pulse Width: 0.4 ms
Lead Channel Sensing Intrinsic Amplitude: 3 mV
Lead Channel Sensing Intrinsic Amplitude: 3 mV
Lead Channel Sensing Intrinsic Amplitude: 6.5 mV
Lead Channel Sensing Intrinsic Amplitude: 6.5 mV
Lead Channel Setting Pacing Amplitude: 2 V
Lead Channel Setting Pacing Amplitude: 2.5 V
Lead Channel Setting Pacing Pulse Width: 0.4 ms
Lead Channel Setting Sensing Sensitivity: 0.3 mV

## 2020-05-10 NOTE — Progress Notes (Signed)
Remote ICD transmission.   

## 2020-06-09 DIAGNOSIS — I5022 Chronic systolic (congestive) heart failure: Secondary | ICD-10-CM | POA: Diagnosis not present

## 2020-06-09 DIAGNOSIS — M722 Plantar fascial fibromatosis: Secondary | ICD-10-CM | POA: Diagnosis not present

## 2020-06-09 DIAGNOSIS — I4891 Unspecified atrial fibrillation: Secondary | ICD-10-CM | POA: Diagnosis not present

## 2020-06-09 DIAGNOSIS — Z299 Encounter for prophylactic measures, unspecified: Secondary | ICD-10-CM | POA: Diagnosis not present

## 2020-06-09 DIAGNOSIS — I10 Essential (primary) hypertension: Secondary | ICD-10-CM | POA: Diagnosis not present

## 2020-06-22 DIAGNOSIS — I10 Essential (primary) hypertension: Secondary | ICD-10-CM | POA: Diagnosis not present

## 2020-06-22 DIAGNOSIS — I4891 Unspecified atrial fibrillation: Secondary | ICD-10-CM | POA: Diagnosis not present

## 2020-06-22 DIAGNOSIS — Z299 Encounter for prophylactic measures, unspecified: Secondary | ICD-10-CM | POA: Diagnosis not present

## 2020-06-22 DIAGNOSIS — M25569 Pain in unspecified knee: Secondary | ICD-10-CM | POA: Diagnosis not present

## 2020-06-27 DIAGNOSIS — I4891 Unspecified atrial fibrillation: Secondary | ICD-10-CM | POA: Diagnosis not present

## 2020-06-27 DIAGNOSIS — I10 Essential (primary) hypertension: Secondary | ICD-10-CM | POA: Diagnosis not present

## 2020-07-10 ENCOUNTER — Other Ambulatory Visit: Payer: Self-pay | Admitting: *Deleted

## 2020-07-10 ENCOUNTER — Telehealth: Payer: Self-pay | Admitting: Cardiology

## 2020-07-10 MED ORDER — APIXABAN 5 MG PO TABS: 5.0000 mg | ORAL_TABLET | Freq: Two times a day (BID) | ORAL | 3 refills | Status: DC

## 2020-07-10 MED ORDER — APIXABAN 5 MG PO TABS: 5.0000 mg | ORAL_TABLET | Freq: Two times a day (BID) | ORAL | 0 refills | Status: DC

## 2020-07-10 MED ORDER — ENTRESTO 97-103 MG PO TABS: 1.0000 | ORAL_TABLET | Freq: Two times a day (BID) | ORAL | 3 refills | Status: DC

## 2020-07-10 NOTE — Telephone Encounter (Signed)
Patient calling the office for samples of medication:   1.  What medication and dosage are you requesting samples for?  ELIQUIS   2.  Are you currently out of this medication? HAS 3 PILLS

## 2020-07-10 NOTE — Telephone Encounter (Signed)
Patient states that he would like for his Eliquis & Entresto to be sent to [facility] for 90 day supply - mail order.    Above sent as requested.   Also, Eliquis samples provided.

## 2020-07-11 ENCOUNTER — Other Ambulatory Visit: Payer: Self-pay | Admitting: *Deleted

## 2020-07-11 MED ORDER — ENTRESTO 97-103 MG PO TABS: 1.0000 | ORAL_TABLET | Freq: Two times a day (BID) | ORAL | 0 refills | Status: DC

## 2020-07-27 DIAGNOSIS — I4891 Unspecified atrial fibrillation: Secondary | ICD-10-CM | POA: Diagnosis not present

## 2020-07-27 DIAGNOSIS — I10 Essential (primary) hypertension: Secondary | ICD-10-CM | POA: Diagnosis not present

## 2020-08-08 ENCOUNTER — Encounter: Payer: Self-pay | Admitting: Cardiology

## 2020-08-08 ENCOUNTER — Ambulatory Visit (INDEPENDENT_AMBULATORY_CARE_PROVIDER_SITE_OTHER): Payer: Medicare HMO

## 2020-08-08 ENCOUNTER — Ambulatory Visit (INDEPENDENT_AMBULATORY_CARE_PROVIDER_SITE_OTHER): Payer: Medicare HMO | Admitting: Cardiology

## 2020-08-08 VITALS — BP 138/82 | HR 85 | Ht 73.0 in | Wt 300.0 lb

## 2020-08-08 DIAGNOSIS — I4891 Unspecified atrial fibrillation: Secondary | ICD-10-CM

## 2020-08-08 DIAGNOSIS — I5022 Chronic systolic (congestive) heart failure: Secondary | ICD-10-CM

## 2020-08-08 DIAGNOSIS — I472 Ventricular tachycardia, unspecified: Secondary | ICD-10-CM

## 2020-08-08 DIAGNOSIS — I428 Other cardiomyopathies: Secondary | ICD-10-CM

## 2020-08-08 LAB — CUP PACEART REMOTE DEVICE CHECK
Battery Remaining Longevity: 86 mo
Battery Voltage: 2.94 V
Brady Statistic AP VP Percent: 0.11 %
Brady Statistic AP VS Percent: 93.92 %
Brady Statistic AS VP Percent: 0.01 %
Brady Statistic AS VS Percent: 5.96 %
Brady Statistic RA Percent Paced: 92.76 %
Brady Statistic RV Percent Paced: 0.14 %
Date Time Interrogation Session: 20211012043726
HighPow Impedance: 71 Ohm
Implantable Lead Implant Date: 20190411
Implantable Lead Implant Date: 20190411
Implantable Lead Location: 753859
Implantable Lead Location: 753860
Implantable Lead Model: 5076
Implantable Lead Model: 6935
Implantable Pulse Generator Implant Date: 20190411
Lead Channel Impedance Value: 361 Ohm
Lead Channel Impedance Value: 418 Ohm
Lead Channel Impedance Value: 456 Ohm
Lead Channel Pacing Threshold Amplitude: 0.5 V
Lead Channel Pacing Threshold Amplitude: 0.75 V
Lead Channel Pacing Threshold Pulse Width: 0.4 ms
Lead Channel Pacing Threshold Pulse Width: 0.4 ms
Lead Channel Sensing Intrinsic Amplitude: 2.875 mV
Lead Channel Sensing Intrinsic Amplitude: 2.875 mV
Lead Channel Sensing Intrinsic Amplitude: 6.125 mV
Lead Channel Sensing Intrinsic Amplitude: 6.125 mV
Lead Channel Setting Pacing Amplitude: 2 V
Lead Channel Setting Pacing Amplitude: 2.5 V
Lead Channel Setting Pacing Pulse Width: 0.4 ms
Lead Channel Setting Sensing Sensitivity: 0.3 mV

## 2020-08-08 MED ORDER — FUROSEMIDE 40 MG PO TABS: 40.0000 mg | ORAL_TABLET | Freq: Every day | ORAL | 3 refills | Status: DC | PRN

## 2020-08-08 MED ORDER — ENTRESTO 97-103 MG PO TABS: 1.0000 | ORAL_TABLET | Freq: Two times a day (BID) | ORAL | 3 refills | Status: DC

## 2020-08-08 MED ORDER — CARVEDILOL 25 MG PO TABS: ORAL_TABLET | ORAL | 3 refills | Status: DC

## 2020-08-08 MED ORDER — APIXABAN 5 MG PO TABS: 5.0000 mg | ORAL_TABLET | Freq: Two times a day (BID) | ORAL | 3 refills | Status: DC

## 2020-08-08 MED ORDER — SPIRONOLACTONE 25 MG PO TABS: 25.0000 mg | ORAL_TABLET | Freq: Every day | ORAL | 1 refills | Status: DC

## 2020-08-08 MED ORDER — AMIODARONE HCL 200 MG PO TABS: 200.0000 mg | ORAL_TABLET | Freq: Every day | ORAL | 3 refills | Status: DC

## 2020-08-08 MED ORDER — ATORVASTATIN CALCIUM 40 MG PO TABS: 40.0000 mg | ORAL_TABLET | Freq: Every day | ORAL | 3 refills | Status: DC

## 2020-08-08 NOTE — Addendum Note (Signed)
Addended by: Burman Nieves T on: 08/08/2020 01:23 PM   Modules accepted: Orders

## 2020-08-08 NOTE — Patient Instructions (Signed)
Your physician recommends that you schedule a follow-up appointment in: 4 MONTHS WITH DR BRANCH  Your physician recommends that you continue on your current medications as directed. Please refer to the Current Medication list given to you today.  Thank you for choosing [facility]!!    

## 2020-08-08 NOTE — Progress Notes (Signed)
Clinical Summary Craig Robinson is a 69 y.o.male seen for follow up of the following medical problems.   1. Chronic systolic heart failure - 06/2017 echo LVEF 25-30%, diffuse hypokinesis, grade I diastolic dysfunction - 06/2017 cath without obstructive CAD - 11/2017 LVEF 20-25% - AICD followed by Dr Ladona Ridgel  - no SOB/DOE. No LE edema - compliant with meds. Dizziness with higher dosing of coreg 12.2m   2. NSVT/VT - AICD followed by EP - also on amio  - no palpitations.    3. PAF - denies any bleeding on eliquis -no palpitations.    Past Medical History:  Diagnosis Date  . AICD (automatic cardioverter/defibrillator) present 02/05/2018  . Asthma   . Hypertension      No Known Allergies   Current Outpatient Medications  Medication Sig Dispense Refill  . amiodarone (PACERONE) 200 MG tablet Take 1 tablet (200 mg total) by mouth daily. 90 tablet 3  . apixaban (ELIQUIS) 5 MG TABS tablet Take 1 tablet (5 mg total) by mouth 2 (two) times daily. 28 tablet 0  . atorvastatin (LIPITOR) 40 MG tablet Take 1 tablet (40 mg total) by mouth daily. 90 tablet 3  . carvedilol (COREG) 25 MG tablet TAKE 12.5 MG IN THE MORNING AND 25 MG IN THE EVENING 135 tablet 3  . furosemide (LASIX) 40 MG tablet Take 1 tablet (40 mg total) by mouth daily as needed. Take for weight gain of 3 lbs or more 90 tablet 3  . OVER THE COUNTER MEDICATION Take 2 tablets by mouth at bedtime. Pain Relief Tablets    . sacubitril-valsartan (ENTRESTO) 97-103 MG Take 1 tablet by mouth 2 (two) times daily. 14 tablet 0  . spironolactone (ALDACTONE) 25 MG tablet Take 1 tablet (25 mg total) by mouth daily. 90 tablet 1  . tamsulosin (FLOMAX) 0.4 MG CAPS capsule Take 0.4 mg by mouth daily.     No current facility-administered medications for this visit.     Past Surgical History:  Procedure Laterality Date  . ICD IMPLANT N/A 02/05/2018   Procedure: ICD IMPLANT;  Surgeon: Marinus Maw, MD;  Location: [facility]  INVASIVE CV LAB;  Service: Cardiovascular;  Laterality: N/A;  . LEFT HEART CATH AND CORONARY ANGIOGRAPHY N/A 07/01/2017   Procedure: LEFT HEART CATH AND CORONARY ANGIOGRAPHY;  Surgeon: Corky Crafts, MD;  Location: [facility] INVASIVE CV LAB;  Service: Cardiovascular;  Laterality: N/A;     No Known Allergies    Family History  Problem Relation Age of Onset  . Alzheimer's disease Mother   . Cancer Father      Social History Mr. Keisler reports that he has never smoked. He has never used smokeless tobacco. Mr. Berenson reports no history of alcohol use.   Review of Systems CONSTITUTIONAL: No weight loss, fever, chills, weakness or fatigue.  HEENT: Eyes: No visual loss, blurred vision, double vision or yellow sclerae.No hearing loss, sneezing, congestion, runny nose or sore throat.  SKIN: No rash or itching.  CARDIOVASCULAR: per hpi RESPIRATORY: No shortness of breath, cough or sputum.  GASTROINTESTINAL: No anorexia, nausea, vomiting or diarrhea. No abdominal pain or blood.  GENITOURINARY: No burning on urination, no polyuria NEUROLOGICAL: No headache, dizziness, syncope, paralysis, ataxia, numbness or tingling in the extremities. No change in bowel or bladder control.  MUSCULOSKELETAL: No muscle, back pain, joint pain or stiffness.  LYMPHATICS: No enlarged nodes. No history of splenectomy.  PSYCHIATRIC: No history of depression or anxiety.  ENDOCRINOLOGIC: No reports of sweating, cold  or heat intolerance. No polyuria or polydipsia.  Marland Kitchen   Physical Examination Today's Vitals   08/08/20 1300  BP: 138/82  Pulse: 85  SpO2: 95%  Weight: 300 lb (136.1 kg)  Height: 6\' 1"  (1.854 m)   Body mass index is 39.58 kg/m.  Gen: resting comfortably, no acute distress HEENT: no scleral icterus, pupils equal round and reactive, no palptable cervical adenopathy,  CV: RRR, no mr/g, no jvd Resp: Clear to auscultation bilaterally GI: abdomen is soft, non-tender, non-distended, normal bowel  sounds, no hepatosplenomegaly MSK: extremities are warm, no edema.  Skin: warm, no rash Neuro:  no focal deficits Psych: appropriate affect     Assessment and Plan   1. Chronic systolic HF -  did not tolerate higher coreg doses - otherwise on maximum therapy, continue current meds. No recent symptoms.   2. NSVT/VT - has ICD in place,started on amiodarone byEP -no recent episodes, continue to follow with EP.    3. Afib -no symptoms, conitnue current meds including anticoag.    , M.D.

## 2020-08-09 NOTE — Progress Notes (Signed)
Remote ICD transmission.   

## 2020-09-26 DIAGNOSIS — I255 Ischemic cardiomyopathy: Secondary | ICD-10-CM | POA: Diagnosis not present

## 2020-09-26 DIAGNOSIS — I10 Essential (primary) hypertension: Secondary | ICD-10-CM | POA: Diagnosis not present

## 2020-09-27 DIAGNOSIS — R351 Nocturia: Secondary | ICD-10-CM | POA: Diagnosis not present

## 2020-09-27 DIAGNOSIS — Z6841 Body Mass Index (BMI) 40.0 and over, adult: Secondary | ICD-10-CM | POA: Diagnosis not present

## 2020-09-27 DIAGNOSIS — I5022 Chronic systolic (congestive) heart failure: Secondary | ICD-10-CM | POA: Diagnosis not present

## 2020-09-27 DIAGNOSIS — I10 Essential (primary) hypertension: Secondary | ICD-10-CM | POA: Diagnosis not present

## 2020-09-27 DIAGNOSIS — N401 Enlarged prostate with lower urinary tract symptoms: Secondary | ICD-10-CM | POA: Diagnosis not present

## 2020-09-27 DIAGNOSIS — Z23 Encounter for immunization: Secondary | ICD-10-CM | POA: Diagnosis not present

## 2020-09-27 DIAGNOSIS — Z299 Encounter for prophylactic measures, unspecified: Secondary | ICD-10-CM | POA: Diagnosis not present

## 2020-10-11 ENCOUNTER — Encounter: Payer: Self-pay | Admitting: Cardiology

## 2020-10-11 DIAGNOSIS — Z7189 Other specified counseling: Secondary | ICD-10-CM | POA: Diagnosis not present

## 2020-10-11 DIAGNOSIS — R5383 Other fatigue: Secondary | ICD-10-CM | POA: Diagnosis not present

## 2020-10-11 DIAGNOSIS — Z0000 Encounter for general adult medical examination without abnormal findings: Secondary | ICD-10-CM | POA: Diagnosis not present

## 2020-10-11 DIAGNOSIS — I10 Essential (primary) hypertension: Secondary | ICD-10-CM | POA: Diagnosis not present

## 2020-10-11 DIAGNOSIS — Z1339 Encounter for screening examination for other mental health and behavioral disorders: Secondary | ICD-10-CM | POA: Diagnosis not present

## 2020-10-11 DIAGNOSIS — Z79899 Other long term (current) drug therapy: Secondary | ICD-10-CM | POA: Diagnosis not present

## 2020-10-11 DIAGNOSIS — Z125 Encounter for screening for malignant neoplasm of prostate: Secondary | ICD-10-CM | POA: Diagnosis not present

## 2020-10-11 DIAGNOSIS — E7800 Pure hypercholesterolemia, unspecified: Secondary | ICD-10-CM | POA: Diagnosis not present

## 2020-10-11 DIAGNOSIS — Z6841 Body Mass Index (BMI) 40.0 and over, adult: Secondary | ICD-10-CM | POA: Diagnosis not present

## 2020-10-11 DIAGNOSIS — I5022 Chronic systolic (congestive) heart failure: Secondary | ICD-10-CM | POA: Diagnosis not present

## 2020-10-11 DIAGNOSIS — Z299 Encounter for prophylactic measures, unspecified: Secondary | ICD-10-CM | POA: Diagnosis not present

## 2020-10-11 DIAGNOSIS — Z1331 Encounter for screening for depression: Secondary | ICD-10-CM | POA: Diagnosis not present

## 2020-11-01 ENCOUNTER — Ambulatory Visit (INDEPENDENT_AMBULATORY_CARE_PROVIDER_SITE_OTHER): Payer: Medicare HMO | Admitting: Internal Medicine

## 2020-11-01 ENCOUNTER — Other Ambulatory Visit: Payer: Self-pay

## 2020-11-01 ENCOUNTER — Encounter: Payer: Self-pay | Admitting: Internal Medicine

## 2020-11-01 VITALS — BP 132/82 | HR 74 | Ht 73.5 in | Wt 307.0 lb

## 2020-11-01 DIAGNOSIS — I472 Ventricular tachycardia, unspecified: Secondary | ICD-10-CM

## 2020-11-01 LAB — CUP PACEART INCLINIC DEVICE CHECK
Battery Remaining Longevity: 83 mo
Battery Voltage: 2.99 V
Brady Statistic AP VP Percent: 0.08 %
Brady Statistic AP VS Percent: 94.52 %
Brady Statistic AS VP Percent: 0 %
Brady Statistic AS VS Percent: 5.4 %
Brady Statistic RA Percent Paced: 93.88 %
Brady Statistic RV Percent Paced: 0.09 %
Date Time Interrogation Session: 20220105142001
HighPow Impedance: 63 Ohm
Implantable Lead Implant Date: 20190411
Implantable Lead Implant Date: 20190411
Implantable Lead Location: 753859
Implantable Lead Location: 753860
Implantable Lead Model: 5076
Implantable Lead Model: 6935
Implantable Pulse Generator Implant Date: 20190411
Lead Channel Impedance Value: 361 Ohm
Lead Channel Impedance Value: 456 Ohm
Lead Channel Impedance Value: 475 Ohm
Lead Channel Pacing Threshold Amplitude: 0.5 V
Lead Channel Pacing Threshold Amplitude: 0.75 V
Lead Channel Pacing Threshold Pulse Width: 0.4 ms
Lead Channel Pacing Threshold Pulse Width: 0.4 ms
Lead Channel Sensing Intrinsic Amplitude: 3.7 mV
Lead Channel Sensing Intrinsic Amplitude: 7.9 mV
Lead Channel Setting Pacing Amplitude: 2 V
Lead Channel Setting Pacing Amplitude: 2.5 V
Lead Channel Setting Pacing Pulse Width: 0.4 ms
Lead Channel Setting Sensing Sensitivity: 0.3 mV

## 2020-11-01 NOTE — Patient Instructions (Signed)
Medication Instructions:  Your physician recommends that you continue on your current medications as directed. Please refer to the Current Medication list given to you today.  Take Lasix 40 mg tomorrow and on Friday morning  *If you need a refill on your cardiac medications before your next appointment, please call your pharmacy*   Lab Work: NONE   If you have labs (blood work) drawn today and your tests are completely normal, you will receive your results only by: Marland Kitchen MyChart Message (if you have MyChart) OR . A paper copy in the mail If you have any lab test that is abnormal or we need to change your treatment, we will call you to review the results.   Testing/Procedures: NONE     Follow-Up: At [facility], you and your health needs are our priority.  As part of our continuing mission to provide you with exceptional heart care, we have created designated Provider Care Teams.  These Care Teams include your primary Cardiologist (physician) and Advanced Practice Providers (APPs -  Physician Assistants and Nurse Practitioners) who all work together to provide you with the care you need, when you need it.  We recommend signing up for the patient portal called "MyChart".  Sign up information is provided on this After Visit Summary.  MyChart is used to connect with patients for Virtual Visits (Telemedicine).  Patients are able to view lab/test results, encounter notes, upcoming appointments, etc.  Non-urgent messages can be sent to your provider as well.   To learn more about what you can do with MyChart, go to [email or web address].    Your next appointment:   1 year(s)  The format for your next appointment:   In Person  Provider:   Lewayne Bunting, MD   Other Instructions Thank you for choosing [facility]!

## 2020-11-01 NOTE — Progress Notes (Signed)
HPI Mr. Poitra returns today for followup.  He is a very pleasant 70 year old man with a history of chronic systolic heart failure, ventricular tachycardia, status post ICD insertion, obesity, and hypertension.  In the interim, he has had minimal dyspnea.  He admits to dietary indiscretion with sodium.  He has not lost weight.  He has no significant peripheral edema.  He denies anginal symptoms.  He denies ICD therapies. No Known Allergies   Current Outpatient Medications  Medication Sig Dispense Refill  . amiodarone (PACERONE) 200 MG tablet Take 1 tablet (200 mg total) by mouth daily. 90 tablet 3  . apixaban (ELIQUIS) 5 MG TABS tablet Take 1 tablet (5 mg total) by mouth 2 (two) times daily. 60 tablet 3  . atorvastatin (LIPITOR) 40 MG tablet Take 1 tablet (40 mg total) by mouth daily. 90 tablet 3  . carvedilol (COREG) 25 MG tablet TAKE 12.5 MG IN THE MORNING AND 25 MG IN THE EVENING 135 tablet 3  . furosemide (LASIX) 40 MG tablet Take 1 tablet (40 mg total) by mouth daily as needed. Take for weight gain of 3 lbs or more 90 tablet 3  . OVER THE COUNTER MEDICATION Take 2 tablets by mouth at bedtime. Pain Relief Tablets    . sacubitril-valsartan (ENTRESTO) 97-103 MG Take 1 tablet by mouth 2 (two) times daily. 60 tablet 3  . spironolactone (ALDACTONE) 25 MG tablet Take 1 tablet (25 mg total) by mouth daily. 90 tablet 1  . tamsulosin (FLOMAX) 0.4 MG CAPS capsule Take 0.4 mg by mouth daily.     No current facility-administered medications for this visit.     Past Medical History:  Diagnosis Date  . AICD (automatic cardioverter/defibrillator) present 02/05/2018  . Asthma   . Hypertension     ROS:   All systems reviewed and negative except as noted in the HPI.   Past Surgical History:  Procedure Laterality Date  . ICD IMPLANT N/A 02/05/2018   Procedure: ICD IMPLANT;  Surgeon: Marinus Maw, MD;  Location: [facility] INVASIVE CV LAB;  Service: Cardiovascular;  Laterality: N/A;  .  LEFT HEART CATH AND CORONARY ANGIOGRAPHY N/A 07/01/2017   Procedure: LEFT HEART CATH AND CORONARY ANGIOGRAPHY;  Surgeon: Corky Crafts, MD;  Location: [facility] INVASIVE CV LAB;  Service: Cardiovascular;  Laterality: N/A;     Family History  Problem Relation Age of Onset  . Alzheimer's disease Mother   . Cancer Father      Social History   Socioeconomic History  . Marital status: Single    Spouse name: Not on file  . Number of children: Not on file  . Years of education: Not on file  . Highest education level: Not on file  Occupational History  . Not on file  Tobacco Use  . Smoking status: Never Smoker  . Smokeless tobacco: Never Used  Vaping Use  . Vaping Use: Never used  Substance and Sexual Activity  . Alcohol use: No  . Drug use: No  . Sexual activity: Not Currently    Partners: Female  Other Topics Concern  . Not on file  Social History Narrative  . Not on file   Social Determinants of Health   Financial Resource Strain: Not on file  Food Insecurity: Not on file  Transportation Needs: Not on file  Physical Activity: Not on file  Stress: Not on file  Social Connections: Not on file  Intimate Partner Violence: Not on file  BP 132/82   Pulse 74   Ht 6' 1.5" (1.867 m)   Wt (!) 307 lb (139.3 kg)   SpO2 96%   BMI 39.95 kg/m   Physical Exam:  Well appearing NAD HEENT: Unremarkable Neck:  No JVD, no thyromegally Lymphatics:  No adenopathy Back:  No CVA tenderness Lungs:  Clear HEART:  Regular rate rhythm, no murmurs, no rubs, no clicks Abd:  soft, positive bowel sounds, no organomegally, no rebound, no guarding Ext:  2 plus pulses, no edema, no cyanosis, no clubbing Skin:  No rashes no nodules Neuro:  CN II through XII intact, motor grossly intact  EKG  DEVICE  Normal device function.  See PaceArt for details.   Assess/Plan: 1.  Ventricular tachycardia -he has had no ICD shocks. No ATP since his last visit.  2.  Obesity.  I have encouraged  the patient to lose weight. Unfortunately he has gained over 10 lbs since his last visit. He admits to dietary indiscretion. 3.  Chronic systolic heart failure -the patient's symptoms are class II.  His fluid index is elevated today.  I have asked the patient to take Lasix 40 mg daily for 2 days. He is not taking lasix regularly.   4.  ICD -his Medtronic dual-chamber ICD is working normally.  We will recheck in several months. Almost 7 years of battery longevity.  Sharlot Gowda Taylia Berber,MD

## 2020-11-07 ENCOUNTER — Ambulatory Visit (INDEPENDENT_AMBULATORY_CARE_PROVIDER_SITE_OTHER): Payer: Medicare HMO

## 2020-11-07 DIAGNOSIS — I5022 Chronic systolic (congestive) heart failure: Secondary | ICD-10-CM | POA: Diagnosis not present

## 2020-11-07 DIAGNOSIS — I428 Other cardiomyopathies: Secondary | ICD-10-CM

## 2020-11-07 LAB — CUP PACEART REMOTE DEVICE CHECK
Battery Remaining Longevity: 83 mo
Battery Voltage: 2.92 V
Brady Statistic AP VP Percent: 0.11 %
Brady Statistic AP VS Percent: 95.64 %
Brady Statistic AS VP Percent: 0.01 %
Brady Statistic AS VS Percent: 4.25 %
Brady Statistic RA Percent Paced: 91.94 %
Brady Statistic RV Percent Paced: 0.13 %
Date Time Interrogation Session: 20220111022723
HighPow Impedance: 69 Ohm
Implantable Lead Implant Date: 20190411
Implantable Lead Implant Date: 20190411
Implantable Lead Location: 753859
Implantable Lead Location: 753860
Implantable Lead Model: 5076
Implantable Lead Model: 6935
Implantable Pulse Generator Implant Date: 20190411
Lead Channel Impedance Value: 361 Ohm
Lead Channel Impedance Value: 456 Ohm
Lead Channel Impedance Value: 475 Ohm
Lead Channel Pacing Threshold Amplitude: 0.5 V
Lead Channel Pacing Threshold Amplitude: 0.75 V
Lead Channel Pacing Threshold Pulse Width: 0.4 ms
Lead Channel Pacing Threshold Pulse Width: 0.4 ms
Lead Channel Sensing Intrinsic Amplitude: 2.875 mV
Lead Channel Sensing Intrinsic Amplitude: 2.875 mV
Lead Channel Sensing Intrinsic Amplitude: 6.25 mV
Lead Channel Sensing Intrinsic Amplitude: 6.25 mV
Lead Channel Setting Pacing Amplitude: 2 V
Lead Channel Setting Pacing Amplitude: 2.5 V
Lead Channel Setting Pacing Pulse Width: 0.4 ms
Lead Channel Setting Sensing Sensitivity: 0.3 mV

## 2020-11-23 NOTE — Progress Notes (Signed)
Remote ICD transmission.   

## 2020-11-27 DIAGNOSIS — I10 Essential (primary) hypertension: Secondary | ICD-10-CM | POA: Diagnosis not present

## 2020-11-27 DIAGNOSIS — I255 Ischemic cardiomyopathy: Secondary | ICD-10-CM | POA: Diagnosis not present

## 2020-12-14 ENCOUNTER — Encounter: Payer: Self-pay | Admitting: *Deleted

## 2020-12-14 ENCOUNTER — Ambulatory Visit (INDEPENDENT_AMBULATORY_CARE_PROVIDER_SITE_OTHER): Payer: Medicare HMO | Admitting: Cardiology

## 2020-12-14 ENCOUNTER — Encounter: Payer: Self-pay | Admitting: Cardiology

## 2020-12-14 VITALS — BP 126/80 | HR 68 | Ht 73.0 in | Wt 300.8 lb

## 2020-12-14 DIAGNOSIS — I5022 Chronic systolic (congestive) heart failure: Secondary | ICD-10-CM

## 2020-12-14 DIAGNOSIS — I4891 Unspecified atrial fibrillation: Secondary | ICD-10-CM | POA: Diagnosis not present

## 2020-12-14 DIAGNOSIS — I472 Ventricular tachycardia: Secondary | ICD-10-CM

## 2020-12-14 DIAGNOSIS — I4729 Other ventricular tachycardia: Secondary | ICD-10-CM

## 2020-12-14 NOTE — Patient Instructions (Signed)
Your physician wants you to follow-up in: 6 MONTHS WITH DR. BRANCH You will receive a reminder letter in the mail two months in advance. If you don't receive a letter, please call our office to schedule the follow-up appointment.  Your physician recommends that you continue on your current medications as directed. Please refer to the Current Medication list given to you today.  Your physician has requested that you have an echocardiogram. Echocardiography is a painless test that uses sound waves to create images of your heart. It provides your doctor with information about the size and shape of your heart and how well your heart's chambers and valves are working. This procedure takes approximately one hour. There are no restrictions for this procedure.  Thank you for choosing [facility]!!   

## 2020-12-14 NOTE — Progress Notes (Signed)
Clinical Summary Craig Robinson is a 70 y.o.male  seen for follow up of the following medical problems.   1. Chronic systolic heart failure - 06/2017 echo LVEF 25-30%, diffuse hypokinesis, grade I diastolic dysfunction - 06/2017 cath without obstructive CAD - 11/2017 LVEF 20-25% - AICD followed by Dr Craig Robinson  - compliant with meds. Dizziness with higher dosing of coreg 12.5mg   -no recent edema. No SOB/DOE - compliant with meds   2. 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    3. PAF - no recent palpitations - no bleeding issues on eliquis   Past Medical History:  Diagnosis Date  . AICD (automatic cardioverter/defibrillator) present 02/05/2018  . Asthma   . Hypertension      No Known Allergies   Current Outpatient Medications  Medication Sig Dispense Refill  . amiodarone (PACERONE) 200 MG tablet Take 1 tablet (200 mg total) by mouth daily. 90 tablet 3  . apixaban (ELIQUIS) 5 MG TABS tablet Take 1 tablet (5 mg total) by mouth 2 (two) times daily. 60 tablet 3  . atorvastatin (LIPITOR) 40 MG tablet Take 1 tablet (40 mg total) by mouth daily. 90 tablet 3  . carvedilol (COREG) 25 MG tablet TAKE 12.5 MG IN THE MORNING AND 25 MG IN THE EVENING 135 tablet 3  . furosemide (LASIX) 40 MG tablet Take 1 tablet (40 mg total) by mouth daily as needed. Take for weight gain of 3 lbs or more 90 tablet 3  . OVER THE COUNTER MEDICATION Take 2 tablets by mouth at bedtime. Pain Relief Tablets    . sacubitril-valsartan (ENTRESTO) 97-103 MG Take 1 tablet by mouth 2 (two) times daily. 60 tablet 3  . spironolactone (ALDACTONE) 25 MG tablet Take 1 tablet (25 mg total) by mouth daily. 90 tablet 1  . tamsulosin (FLOMAX) 0.4 MG CAPS capsule Take 0.4 mg by mouth daily.     No current facility-administered medications for this visit.     Past Surgical History:  Procedure Laterality Date  . ICD IMPLANT N/A 02/05/2018   Procedure: ICD IMPLANT;   Surgeon: Craig Maw, MD;  Location: [facility] INVASIVE CV LAB;  Service: Cardiovascular;  Laterality: N/A;  . LEFT HEART CATH AND CORONARY ANGIOGRAPHY N/A 07/01/2017   Procedure: LEFT HEART CATH AND CORONARY ANGIOGRAPHY;  Surgeon: Craig Crafts, MD;  Location: [facility] INVASIVE CV LAB;  Service: Cardiovascular;  Laterality: N/A;     No Known Allergies    Family History  Problem Relation Age of Onset  . Alzheimer's disease Mother   . Cancer Father      Social History Craig Robinson reports that he has never smoked. He has never used smokeless tobacco. Craig Robinson reports no history of alcohol use.   Review of Systems CONSTITUTIONAL: No weight loss, fever, chills, weakness or fatigue.  HEENT: Eyes: No visual loss, blurred vision, double vision or yellow sclerae.No hearing loss, sneezing, congestion, runny nose or sore throat.  SKIN: No rash or itching.  CARDIOVASCULAR: per hpi RESPIRATORY: No shortness of breath, cough or sputum.  GASTROINTESTINAL: No anorexia, nausea, vomiting or diarrhea. No abdominal pain or blood.  GENITOURINARY: No burning on urination, no polyuria NEUROLOGICAL: No headache, dizziness, syncope, paralysis, ataxia, numbness or tingling in the extremities. No change in bowel or bladder control.  MUSCULOSKELETAL: No muscle, back pain, joint pain or stiffness.  LYMPHATICS: No enlarged nodes. No history of splenectomy.  PSYCHIATRIC: No history of depression or anxiety.  ENDOCRINOLOGIC: No reports of sweating, cold or heat intolerance. No polyuria or polydipsia.  Marland Kitchen   Physical Examination Today's Vitals   12/14/20 0822  BP: 126/80  Pulse: 68  SpO2: 98%  Weight: (!) 300 lb 12.8 oz (136.4 kg)  Height: 6\' 1"  (1.854 m)   Body mass index is 39.69 kg/m.  Gen: resting comfortably, no acute distress HEENT: no scleral icterus, pupils equal round and reactive, no palptable cervical adenopathy,  CV: RRR, no m/r/g, no jvd Resp: Clear to auscultation bilaterally GI:  abdomen is soft, non-tender, non-distended, normal bowel sounds, no hepatosplenomegaly MSK: extremities are warm, no edema.  Skin: warm, no rash Neuro:  no focal deficits Psych: appropriate affect   Diagnostic Studies     Assessment and Plan  1. Chronic systolic HF - did not tolerate higher coreg doses - no recent symptoms - 3 years since last echo, repeat study to reassess LVEF. May consider farxiga if ongoing dysfunction.   2. NSVT/VT - has ICD in place,started on amiodarone byEP - no symptmos, continue current therpay.    3. Afib -denies any symptoms - continue current meds including anticoag     , M.D.

## 2020-12-25 ENCOUNTER — Other Ambulatory Visit: Payer: Self-pay | Admitting: Cardiology

## 2021-01-11 ENCOUNTER — Other Ambulatory Visit: Payer: Medicare HMO

## 2021-02-06 ENCOUNTER — Ambulatory Visit (INDEPENDENT_AMBULATORY_CARE_PROVIDER_SITE_OTHER): Payer: Medicare HMO

## 2021-02-06 DIAGNOSIS — I428 Other cardiomyopathies: Secondary | ICD-10-CM | POA: Diagnosis not present

## 2021-02-06 LAB — CUP PACEART REMOTE DEVICE CHECK
Battery Remaining Longevity: 79 mo
Battery Voltage: 2.99 V
Brady Statistic AP VP Percent: 0.08 %
Brady Statistic AP VS Percent: 94.45 %
Brady Statistic AS VP Percent: 0 %
Brady Statistic AS VS Percent: 5.47 %
Brady Statistic RA Percent Paced: 93.36 %
Brady Statistic RV Percent Paced: 0.09 %
Date Time Interrogation Session: 20220412033625
HighPow Impedance: 64 Ohm
Implantable Lead Implant Date: 20190411
Implantable Lead Implant Date: 20190411
Implantable Lead Location: 753859
Implantable Lead Location: 753860
Implantable Lead Model: 5076
Implantable Lead Model: 6935
Implantable Pulse Generator Implant Date: 20190411
Lead Channel Impedance Value: 361 Ohm
Lead Channel Impedance Value: 418 Ohm
Lead Channel Impedance Value: 456 Ohm
Lead Channel Pacing Threshold Amplitude: 0.5 V
Lead Channel Pacing Threshold Amplitude: 0.75 V
Lead Channel Pacing Threshold Pulse Width: 0.4 ms
Lead Channel Pacing Threshold Pulse Width: 0.4 ms
Lead Channel Sensing Intrinsic Amplitude: 2.875 mV
Lead Channel Sensing Intrinsic Amplitude: 2.875 mV
Lead Channel Sensing Intrinsic Amplitude: 5.5 mV
Lead Channel Sensing Intrinsic Amplitude: 5.5 mV
Lead Channel Setting Pacing Amplitude: 2 V
Lead Channel Setting Pacing Amplitude: 2.5 V
Lead Channel Setting Pacing Pulse Width: 0.4 ms
Lead Channel Setting Sensing Sensitivity: 0.3 mV

## 2021-02-07 ENCOUNTER — Ambulatory Visit (INDEPENDENT_AMBULATORY_CARE_PROVIDER_SITE_OTHER): Payer: Medicare HMO

## 2021-02-07 ENCOUNTER — Other Ambulatory Visit: Payer: Self-pay

## 2021-02-07 DIAGNOSIS — I5022 Chronic systolic (congestive) heart failure: Secondary | ICD-10-CM

## 2021-02-07 LAB — ECHOCARDIOGRAM COMPLETE
AR max vel: 2.32 cm2
AV Area VTI: 2.64 cm2
AV Area mean vel: 2.43 cm2
AV Mean grad: 2.6 mmHg
AV Peak grad: 5.8 mmHg
Ao pk vel: 1.21 m/s
Area-P 1/2: 1.98 cm2
Calc EF: 29.9 %
MV M vel: 1.28 m/s
MV Peak grad: 6.6 mmHg
S' Lateral: 5.57 cm
Single Plane A2C EF: 31.2 %
Single Plane A4C EF: 27.9 %

## 2021-02-13 ENCOUNTER — Telehealth: Payer: Self-pay | Admitting: *Deleted

## 2021-02-13 MED ORDER — DAPAGLIFLOZIN PROPANEDIOL 10 MG PO TABS: 10.0000 mg | ORAL_TABLET | Freq: Every day | ORAL | 1 refills | Status: DC

## 2021-02-13 NOTE — Telephone Encounter (Signed)
-----   Message from Antoine Poche, MD sent at 02/12/2021 12:30 PM EDT ----- Echo shows heart pumping function remains decreased at 30-35%. Can we see if he can start farxiga 10mg  daily   MD

## 2021-02-13 NOTE — Telephone Encounter (Signed)
Pt aware and agreeable to start farxiga 10 mg daily - send to [facility] as requested

## 2021-02-15 ENCOUNTER — Telehealth: Payer: Self-pay | Admitting: *Deleted

## 2021-02-15 NOTE — Telephone Encounter (Signed)
Pt approved from [employer] for Farxiga through 10/27/21  Pt ID: [id number]

## 2021-02-21 NOTE — Progress Notes (Signed)
Remote ICD transmission.   

## 2021-02-22 ENCOUNTER — Other Ambulatory Visit: Payer: Self-pay | Admitting: *Deleted

## 2021-02-22 MED ORDER — DAPAGLIFLOZIN PROPANEDIOL 10 MG PO TABS: 10.0000 mg | ORAL_TABLET | Freq: Every day | ORAL | 3 refills | Status: DC

## 2021-03-06 ENCOUNTER — Other Ambulatory Visit: Payer: Self-pay | Admitting: *Deleted

## 2021-03-06 MED ORDER — ELIQUIS 5 MG PO TABS: 5.0000 mg | ORAL_TABLET | Freq: Two times a day (BID) | ORAL | 3 refills | Status: DC

## 2021-03-15 ENCOUNTER — Telehealth: Payer: Self-pay | Admitting: Cardiology

## 2021-03-15 ENCOUNTER — Other Ambulatory Visit: Payer: Self-pay | Admitting: *Deleted

## 2021-03-15 MED ORDER — DAPAGLIFLOZIN PROPANEDIOL 10 MG PO TABS: 10.0000 mg | ORAL_TABLET | Freq: Every day | ORAL | 3 refills | Status: DC

## 2021-03-15 NOTE — Telephone Encounter (Signed)
Please call Ebony with [employer] [phone]

## 2021-03-16 NOTE — Telephone Encounter (Signed)
Patient name is spelled wrong on the approval letter for [employer] so [employer] can not use the current prescription on file for the Farxiga because Jillyn Hidden only has 1 R and on the approval letter it has Allison with 2 Rs

## 2021-03-27 MED ORDER — DAPAGLIFLOZIN PROPANEDIOL 10 MG PO TABS: 10.0000 mg | ORAL_TABLET | Freq: Every day | ORAL | 3 refills | Status: DC

## 2021-03-27 NOTE — Telephone Encounter (Signed)
Name spelling updated in Epic - new rx sent to [facility]

## 2021-04-05 ENCOUNTER — Telehealth: Payer: Self-pay | Admitting: *Deleted

## 2021-04-05 NOTE — Telephone Encounter (Signed)
Received fax from [facility] - patient now eligible to receive Eliquis free-of-charge from 04/05/2021 through 10/27/2021.

## 2021-04-23 ENCOUNTER — Telehealth: Payer: Self-pay | Admitting: *Deleted

## 2021-04-23 NOTE — Telephone Encounter (Signed)
Fax received from [facility] [place] stating they have shipped medication to this patient.  Craig Robinson)

## 2021-05-08 ENCOUNTER — Ambulatory Visit (INDEPENDENT_AMBULATORY_CARE_PROVIDER_SITE_OTHER): Payer: Medicare HMO

## 2021-05-08 DIAGNOSIS — I428 Other cardiomyopathies: Secondary | ICD-10-CM | POA: Diagnosis not present

## 2021-05-08 LAB — CUP PACEART REMOTE DEVICE CHECK
Battery Remaining Longevity: 74 mo
Battery Voltage: 2.99 V
Brady Statistic AP VP Percent: 0.05 %
Brady Statistic AP VS Percent: 95.43 %
Brady Statistic AS VP Percent: 0 %
Brady Statistic AS VS Percent: 4.52 %
Brady Statistic RA Percent Paced: 95.04 %
Brady Statistic RV Percent Paced: 0.06 %
Date Time Interrogation Session: 20220712033625
HighPow Impedance: 72 Ohm
Implantable Lead Implant Date: 20190411
Implantable Lead Implant Date: 20190411
Implantable Lead Location: 753859
Implantable Lead Location: 753860
Implantable Lead Model: 5076
Implantable Lead Model: 6935
Implantable Pulse Generator Implant Date: 20190411
Lead Channel Impedance Value: 418 Ohm
Lead Channel Impedance Value: 475 Ohm
Lead Channel Impedance Value: 475 Ohm
Lead Channel Pacing Threshold Amplitude: 0.5 V
Lead Channel Pacing Threshold Amplitude: 0.625 V
Lead Channel Pacing Threshold Pulse Width: 0.4 ms
Lead Channel Pacing Threshold Pulse Width: 0.4 ms
Lead Channel Sensing Intrinsic Amplitude: 3.25 mV
Lead Channel Sensing Intrinsic Amplitude: 3.25 mV
Lead Channel Sensing Intrinsic Amplitude: 6.375 mV
Lead Channel Sensing Intrinsic Amplitude: 6.375 mV
Lead Channel Setting Pacing Amplitude: 2 V
Lead Channel Setting Pacing Amplitude: 2.5 V
Lead Channel Setting Pacing Pulse Width: 0.4 ms
Lead Channel Setting Sensing Sensitivity: 0.3 mV

## 2021-05-24 ENCOUNTER — Other Ambulatory Visit: Payer: Self-pay | Admitting: Cardiology

## 2021-05-31 NOTE — Progress Notes (Signed)
Remote ICD transmission.   

## 2021-06-25 ENCOUNTER — Encounter: Payer: Self-pay | Admitting: Cardiology

## 2021-06-25 ENCOUNTER — Other Ambulatory Visit: Payer: Self-pay

## 2021-06-25 ENCOUNTER — Ambulatory Visit (INDEPENDENT_AMBULATORY_CARE_PROVIDER_SITE_OTHER): Payer: Medicare HMO | Admitting: Cardiology

## 2021-06-25 VITALS — BP 100/68 | HR 68 | Ht 73.0 in | Wt 283.6 lb

## 2021-06-25 DIAGNOSIS — I472 Ventricular tachycardia: Secondary | ICD-10-CM

## 2021-06-25 DIAGNOSIS — I4891 Unspecified atrial fibrillation: Secondary | ICD-10-CM | POA: Diagnosis not present

## 2021-06-25 DIAGNOSIS — E782 Mixed hyperlipidemia: Secondary | ICD-10-CM

## 2021-06-25 DIAGNOSIS — I4729 Other ventricular tachycardia: Secondary | ICD-10-CM

## 2021-06-25 DIAGNOSIS — I5022 Chronic systolic (congestive) heart failure: Secondary | ICD-10-CM

## 2021-06-25 NOTE — Progress Notes (Signed)
Clinical Summary Mr. Rafter is a 70 y.o.male seen for follow up of the following medical problems.      1. Chronic systolic heart failure - 06/2017 echo LVEF 25-30%, diffuse hypokinesis, grade I diastolic dysfunction - 06/2017 cath without obstructive CAD - 11/2017 LVEF 20-25% - AICD followed by Dr Ladona Ridgel  01/2021 echo LVEF 30-35%.  - was to start farxiga after 01/2021 echo, he was able to start farxiga - no recent SOB/DOE, no recent edema. Has not needed his prn lasix. Down 17 lbs since 11/2020 with increased exercise.  - compliant with meds      2. NSVT/VT - AICD followed by EP - also on amio   -no recent palpitations - 04/2021 normal device check     3. PAF  - no recent palpitations - no bleeding on eliquis.    4. Hyperlipidemia - labs followed byp cp  Past Medical History:  Diagnosis Date   AICD (automatic cardioverter/defibrillator) present 02/05/2018   Asthma    Hypertension      No Known Allergies   Current Outpatient Medications  Medication Sig Dispense Refill   amiodarone (PACERONE) 200 MG tablet Take 1 tablet (200 mg total) by mouth daily. 90 tablet 3   apixaban (ELIQUIS) 5 MG TABS tablet Take 1 tablet (5 mg total) by mouth 2 (two) times daily. 180 tablet 3   atorvastatin (LIPITOR) 40 MG tablet Take 1 tablet (40 mg total) by mouth daily. 90 tablet 3   carvedilol (COREG) 25 MG tablet TAKE 12.5 MG IN THE MORNING AND 25 MG IN THE EVENING 135 tablet 3   dapagliflozin propanediol (FARXIGA) 10 MG TABS tablet Take 1 tablet (10 mg total) by mouth daily before breakfast. 90 tablet 3   ENTRESTO 97-103 MG TAKE 1 TABLET TWICE DAILY 180 tablet 2   furosemide (LASIX) 40 MG tablet Take 40 mg by mouth daily as needed.     meloxicam (MOBIC) 7.5 MG tablet Take 7.5 mg by mouth daily.     OVER THE COUNTER MEDICATION Take 2 tablets by mouth at bedtime. Pain Relief Tablets     sildenafil (VIAGRA) 100 MG tablet Take 100 mg by mouth daily as needed.     spironolactone  (ALDACTONE) 25 MG tablet TAKE 1 TABLET EVERY DAY 90 tablet 1   tamsulosin (FLOMAX) 0.4 MG CAPS capsule Take 0.4 mg by mouth daily.     No current facility-administered medications for this visit.     Past Surgical History:  Procedure Laterality Date   ICD IMPLANT N/A 02/05/2018   Procedure: ICD IMPLANT;  Surgeon: Marinus Maw, MD;  Location: [facility] INVASIVE CV LAB;  Service: Cardiovascular;  Laterality: N/A;   LEFT HEART CATH AND CORONARY ANGIOGRAPHY N/A 07/01/2017   Procedure: LEFT HEART CATH AND CORONARY ANGIOGRAPHY;  Surgeon: Corky Crafts, MD;  Location: [facility] INVASIVE CV LAB;  Service: Cardiovascular;  Laterality: N/A;     No Known Allergies    Family History  Problem Relation Age of Onset   Alzheimer's disease Mother    Cancer Father      Social History Mr. Onorato reports that he has never smoked. He has never used smokeless tobacco. Mr. Steidle reports no history of alcohol use.   Review of Systems CONSTITUTIONAL: No weight loss, fever, chills, weakness or fatigue.  HEENT: Eyes: No visual loss, blurred vision, double vision or yellow sclerae.No hearing loss, sneezing, congestion, runny nose or sore throat.  SKIN: No rash or itching.  CARDIOVASCULAR: per  hpi RESPIRATORY: No shortness of breath, cough or sputum.  GASTROINTESTINAL: No anorexia, nausea, vomiting or diarrhea. No abdominal pain or blood.  GENITOURINARY: No burning on urination, no polyuria NEUROLOGICAL: No headache, dizziness, syncope, paralysis, ataxia, numbness or tingling in the extremities. No change in bowel or bladder control.  MUSCULOSKELETAL: No muscle, back pain, joint pain or stiffness.  LYMPHATICS: No enlarged nodes. No history of splenectomy.  PSYCHIATRIC: No history of depression or anxiety.  ENDOCRINOLOGIC: No reports of sweating, cold or heat intolerance. No polyuria or polydipsia.  Marland Kitchen   Physical Examination Today's Vitals   06/25/21 0854  BP: 100/68  Pulse: 68  SpO2: 98%   Weight: 283 lb 9.6 oz (128.6 kg)  Height: 6\' 1"  (1.854 m)   Body mass index is 37.42 kg/m.  Gen: resting comfortably, no acute distress HEENT: no scleral icterus, pupils equal round and reactive, no palptable cervical adenopathy,  CV: RRR, no m/r/g no jvd Resp: Clear to auscultation bilaterally GI: abdomen is soft, non-tender, non-distended, normal bowel sounds, no hepatosplenomegaly MSK: extremities are warm, no edema.  Skin: warm, no rash Neuro:  no focal deficits Psych: appropriate affect   Diagnostic Studies  01/2021 echo 1. Left ventricular ejection fraction, by estimation, is 30 to 35%. The  left ventricle has moderately decreased function. The left ventricle  demonstrates global hypokinesis. The left ventricular internal cavity size  was mildly dilated. Left ventricular  diastolic parameters are consistent with Grade I diastolic dysfunction  (impaired relaxation). The average left ventricular global longitudinal  strain is 10.2 %. The global longitudinal strain is abnormal.   2. Right ventricular systolic function was not well visualized. The right  ventricular size is not well visualized.   3. The mitral valve was not well visualized. No evidence of mitral valve  regurgitation. No evidence of mitral stenosis.   4. The aortic valve has an indeterminant number of cusps. There is mild  calcification of the aortic valve. There is mild thickening of the aortic  valve. Aortic valve regurgitation is not visualized. No aortic stenosis is  present.   5. The inferior vena cava is normal in size with greater than 50%  respiratory variability, suggesting right atrial pressure of 3 mmHg.    Assessment and Plan  1. Chronic systolic HF -no recent symptoms, appears euvolemic today - continue current meds, he is on optimal medical therapy.  2. NSVT/VT - has ICD in place, started on amiodarone by EP - no recent symptoms, recent normal device check - continue current therapy -  request labs from pcp, if not included LFTs and TFTs order at next visit since on amio     3. Afib - no symptoms, continue current meds including anticoagulation  4. Hyperlipidemia - request pcp labs, continue statin      02/2021, M.D.

## 2021-06-25 NOTE — Patient Instructions (Signed)
Medication Instructions:  Continue all current medications.   Labwork: none  Testing/Procedures: none  Follow-Up: 6 months   Any Other Special Instructions Will Be Listed Below (If Applicable).   If you need a refill on your cardiac medications before your next appointment, please call your pharmacy.  

## 2021-06-26 ENCOUNTER — Encounter: Payer: Self-pay | Admitting: *Deleted

## 2021-06-26 NOTE — Addendum Note (Signed)
Addended by: Eustace Moore on: 06/26/2021 02:24 PM   Modules accepted: Orders

## 2021-07-11 DIAGNOSIS — M25512 Pain in left shoulder: Secondary | ICD-10-CM | POA: Diagnosis not present

## 2021-07-11 DIAGNOSIS — M25511 Pain in right shoulder: Secondary | ICD-10-CM | POA: Diagnosis not present

## 2021-07-11 DIAGNOSIS — S63502A Unspecified sprain of left wrist, initial encounter: Secondary | ICD-10-CM | POA: Diagnosis not present

## 2021-07-11 DIAGNOSIS — M25532 Pain in left wrist: Secondary | ICD-10-CM | POA: Diagnosis not present

## 2021-07-16 ENCOUNTER — Other Ambulatory Visit: Payer: Self-pay | Admitting: Cardiology

## 2021-07-23 ENCOUNTER — Telehealth: Payer: Self-pay | Admitting: Cardiology

## 2021-07-23 DIAGNOSIS — I10 Essential (primary) hypertension: Secondary | ICD-10-CM

## 2021-07-23 DIAGNOSIS — I5022 Chronic systolic (congestive) heart failure: Secondary | ICD-10-CM

## 2021-07-23 MED ORDER — APIXABAN 5 MG PO TABS: 5.0000 mg | ORAL_TABLET | Freq: Two times a day (BID) | ORAL | 0 refills | Status: DC

## 2021-07-23 NOTE — Telephone Encounter (Signed)
Patient notified and verbalized understanding.  He will pick up tomorrow.

## 2021-07-23 NOTE — Telephone Encounter (Signed)
Pt called requesting Eliquis samples. Indication: PAF Last office visit: 06/25/21  Dominga Ferry MD Scr: 0.94 on 10/11/20 Age:  70 Weight: 128.6kg  Based on above findings Eliquis 5mg  twice daily is the appropriate dose. Pt is past due for labs (CBC./BMP)  Ok to provide samples if available. Please give pt lab order for CBC/BMP at that time.

## 2021-07-23 NOTE — Telephone Encounter (Signed)
Pt called stating that he has no received his mail order for his ELIQUIS 5 MG TABS tablet [[id number]] yet and he's taken his last bill today- would like to know if he could get a sample bottle incase they do not arrive today.   Please call [phone]

## 2021-07-26 ENCOUNTER — Other Ambulatory Visit (HOSPITAL_COMMUNITY)
Admission: RE | Admit: 2021-07-26 | Discharge: 2021-07-26 | Disposition: A | Discharge status: Home / Self Care | Payer: Medicare HMO | Source: Ambulatory Visit | Attending: Cardiology | Admitting: Cardiology

## 2021-07-26 ENCOUNTER — Other Ambulatory Visit: Payer: Self-pay

## 2021-07-26 DIAGNOSIS — I10 Essential (primary) hypertension: Secondary | ICD-10-CM | POA: Diagnosis not present

## 2021-07-26 DIAGNOSIS — I5022 Chronic systolic (congestive) heart failure: Secondary | ICD-10-CM | POA: Insufficient documentation

## 2021-07-26 LAB — CBC
HCT: 39.3 % (ref 39.0–52.0)
Hemoglobin: 12.8 g/dL — ABNORMAL LOW (ref 13.0–17.0)
MCH: 34.3 pg — ABNORMAL HIGH (ref 26.0–34.0)
MCHC: 32.6 g/dL (ref 30.0–36.0)
MCV: 105.4 fL — ABNORMAL HIGH (ref 80.0–100.0)
Platelets: 192 10*3/uL (ref 150–400)
RBC: 3.73 MIL/uL — ABNORMAL LOW (ref 4.22–5.81)
RDW: 13.2 % (ref 11.5–15.5)
WBC: 4.6 10*3/uL (ref 4.0–10.5)
nRBC: 0 % (ref 0.0–0.2)

## 2021-07-26 LAB — BASIC METABOLIC PANEL
Anion gap: 4 — ABNORMAL LOW (ref 5–15)
BUN: 19 mg/dL (ref 8–23)
CO2: 26 mmol/L (ref 22–32)
Calcium: 8.7 mg/dL — ABNORMAL LOW (ref 8.9–10.3)
Chloride: 108 mmol/L (ref 98–111)
Creatinine, Ser: 1.49 mg/dL — ABNORMAL HIGH (ref 0.61–1.24)
GFR, Estimated: 50 mL/min — ABNORMAL LOW (ref >60)
Glucose, Bld: 93 mg/dL (ref 70–99)
Potassium: 4.9 mmol/L (ref 3.5–5.1)
Sodium: 138 mmol/L (ref 135–145)

## 2021-08-03 ENCOUNTER — Telehealth: Payer: Self-pay | Admitting: *Deleted

## 2021-08-03 DIAGNOSIS — I5022 Chronic systolic (congestive) heart failure: Secondary | ICD-10-CM

## 2021-08-03 DIAGNOSIS — I10 Essential (primary) hypertension: Secondary | ICD-10-CM

## 2021-08-03 NOTE — Telephone Encounter (Signed)
Lesle Chris, LPN  25/12/6642  6:43 PM EDT Back to Top    Notified, copy to pcp.  Will mail lab order to home, he will do at [facility] in 3 weeks

## 2021-08-03 NOTE — Telephone Encounter (Signed)
-----   Message from Antoine Poche, MD sent at 07/31/2021  4:13 PM EDT ----- Some decrease in kidney function. Needs to work on staying better hydrated, at least 4 bottles of water daily. Repeat bmet 3 weeks.   Dominga Ferry MD

## 2021-08-07 ENCOUNTER — Ambulatory Visit (INDEPENDENT_AMBULATORY_CARE_PROVIDER_SITE_OTHER): Payer: Medicare HMO

## 2021-08-07 DIAGNOSIS — I428 Other cardiomyopathies: Secondary | ICD-10-CM

## 2021-08-07 LAB — CUP PACEART REMOTE DEVICE CHECK
Battery Remaining Longevity: 67 mo
Battery Voltage: 2.99 V
Brady Statistic AP VP Percent: 0.07 %
Brady Statistic AP VS Percent: 96.22 %
Brady Statistic AS VP Percent: 0 %
Brady Statistic AS VS Percent: 3.71 %
Brady Statistic RA Percent Paced: 95.73 %
Brady Statistic RV Percent Paced: 0.09 %
Date Time Interrogation Session: 20221011044223
HighPow Impedance: 68 Ohm
Implantable Lead Implant Date: 20190411
Implantable Lead Implant Date: 20190411
Implantable Lead Location: 753859
Implantable Lead Location: 753860
Implantable Lead Model: 5076
Implantable Lead Model: 6935
Implantable Pulse Generator Implant Date: 20190411
Lead Channel Impedance Value: 361 Ohm
Lead Channel Impedance Value: 456 Ohm
Lead Channel Impedance Value: 475 Ohm
Lead Channel Pacing Threshold Amplitude: 0.5 V
Lead Channel Pacing Threshold Amplitude: 0.75 V
Lead Channel Pacing Threshold Pulse Width: 0.4 ms
Lead Channel Pacing Threshold Pulse Width: 0.4 ms
Lead Channel Sensing Intrinsic Amplitude: 2.75 mV
Lead Channel Sensing Intrinsic Amplitude: 2.75 mV
Lead Channel Sensing Intrinsic Amplitude: 6 mV
Lead Channel Sensing Intrinsic Amplitude: 6 mV
Lead Channel Setting Pacing Amplitude: 2 V
Lead Channel Setting Pacing Amplitude: 2.5 V
Lead Channel Setting Pacing Pulse Width: 0.4 ms
Lead Channel Setting Sensing Sensitivity: 0.3 mV

## 2021-08-15 NOTE — Progress Notes (Signed)
Remote ICD transmission.   

## 2021-08-16 DIAGNOSIS — Z23 Encounter for immunization: Secondary | ICD-10-CM | POA: Diagnosis not present

## 2021-08-20 DIAGNOSIS — Z299 Encounter for prophylactic measures, unspecified: Secondary | ICD-10-CM | POA: Diagnosis not present

## 2021-08-20 DIAGNOSIS — I10 Essential (primary) hypertension: Secondary | ICD-10-CM | POA: Diagnosis not present

## 2021-08-20 DIAGNOSIS — M25561 Pain in right knee: Secondary | ICD-10-CM | POA: Diagnosis not present

## 2021-08-20 DIAGNOSIS — Z87891 Personal history of nicotine dependence: Secondary | ICD-10-CM | POA: Diagnosis not present

## 2021-08-24 ENCOUNTER — Other Ambulatory Visit (HOSPITAL_COMMUNITY)
Admission: RE | Admit: 2021-08-24 | Discharge: 2021-08-24 | Disposition: A | Discharge status: Home / Self Care | Payer: Medicare HMO | Source: Ambulatory Visit | Attending: Cardiology | Admitting: Cardiology

## 2021-08-24 ENCOUNTER — Other Ambulatory Visit: Payer: Self-pay

## 2021-08-24 DIAGNOSIS — I5022 Chronic systolic (congestive) heart failure: Secondary | ICD-10-CM | POA: Diagnosis not present

## 2021-08-24 DIAGNOSIS — I10 Essential (primary) hypertension: Secondary | ICD-10-CM

## 2021-08-24 LAB — BASIC METABOLIC PANEL
Anion gap: 6 (ref 5–15)
BUN: 24 mg/dL — ABNORMAL HIGH (ref 8–23)
CO2: 26 mmol/L (ref 22–32)
Calcium: 9 mg/dL (ref 8.9–10.3)
Chloride: 107 mmol/L (ref 98–111)
Creatinine, Ser: 1.34 mg/dL — ABNORMAL HIGH (ref 0.61–1.24)
GFR, Estimated: 57 mL/min — ABNORMAL LOW (ref >60)
Glucose, Bld: 101 mg/dL — ABNORMAL HIGH (ref 70–99)
Potassium: 4.2 mmol/L (ref 3.5–5.1)
Sodium: 139 mmol/L (ref 135–145)

## 2021-09-10 ENCOUNTER — Telehealth: Payer: Self-pay | Admitting: *Deleted

## 2021-09-10 ENCOUNTER — Encounter: Payer: Self-pay | Admitting: *Deleted

## 2021-09-10 NOTE — Telephone Encounter (Signed)
Lesle Chris, LPN  11/55/2080  3:13 PM EST Back to Top    Patient notified via letter.  Copy to pcp.

## 2021-09-10 NOTE — Telephone Encounter (Signed)
-----   Message from Antoine Poche, MD sent at 09/07/2021 11:25 AM EST ----- Labs overall look good. Mild stress on kidneys but improved from last check. Continue to monitor  J BrancH MD

## 2021-10-15 ENCOUNTER — Encounter: Payer: Self-pay | Admitting: Cardiology

## 2021-10-15 DIAGNOSIS — I5022 Chronic systolic (congestive) heart failure: Secondary | ICD-10-CM | POA: Diagnosis not present

## 2021-10-15 DIAGNOSIS — Z1331 Encounter for screening for depression: Secondary | ICD-10-CM | POA: Diagnosis not present

## 2021-10-15 DIAGNOSIS — Z0000 Encounter for general adult medical examination without abnormal findings: Secondary | ICD-10-CM | POA: Diagnosis not present

## 2021-10-15 DIAGNOSIS — Z79899 Other long term (current) drug therapy: Secondary | ICD-10-CM | POA: Diagnosis not present

## 2021-10-15 DIAGNOSIS — Z6841 Body Mass Index (BMI) 40.0 and over, adult: Secondary | ICD-10-CM | POA: Diagnosis not present

## 2021-10-15 DIAGNOSIS — Z299 Encounter for prophylactic measures, unspecified: Secondary | ICD-10-CM | POA: Diagnosis not present

## 2021-10-15 DIAGNOSIS — I10 Essential (primary) hypertension: Secondary | ICD-10-CM | POA: Diagnosis not present

## 2021-10-15 DIAGNOSIS — E7800 Pure hypercholesterolemia, unspecified: Secondary | ICD-10-CM | POA: Diagnosis not present

## 2021-10-15 DIAGNOSIS — Z87891 Personal history of nicotine dependence: Secondary | ICD-10-CM | POA: Diagnosis not present

## 2021-10-15 DIAGNOSIS — Z7189 Other specified counseling: Secondary | ICD-10-CM | POA: Diagnosis not present

## 2021-10-15 DIAGNOSIS — Z125 Encounter for screening for malignant neoplasm of prostate: Secondary | ICD-10-CM | POA: Diagnosis not present

## 2021-10-15 DIAGNOSIS — Z1339 Encounter for screening examination for other mental health and behavioral disorders: Secondary | ICD-10-CM | POA: Diagnosis not present

## 2021-10-15 DIAGNOSIS — R5383 Other fatigue: Secondary | ICD-10-CM | POA: Diagnosis not present

## 2021-10-31 ENCOUNTER — Encounter: Payer: Self-pay | Admitting: Internal Medicine

## 2021-10-31 ENCOUNTER — Telehealth: Payer: Self-pay | Admitting: Cardiology

## 2021-10-31 MED ORDER — DAPAGLIFLOZIN PROPANEDIOL 10 MG PO TABS: 10.0000 mg | ORAL_TABLET | Freq: Every day | ORAL | 3 refills | Status: DC

## 2021-10-31 NOTE — Addendum Note (Signed)
Addended by: Eustace Moore on: 10/31/2021 02:14 PM   Modules accepted: Orders

## 2021-10-31 NOTE — Telephone Encounter (Signed)
Advised that new farxiga rx faxed to number provided for [phone].

## 2021-10-31 NOTE — Telephone Encounter (Signed)
error 

## 2021-10-31 NOTE — Telephone Encounter (Signed)
Patient called stating he needs a new script sent for his [place] sent to the company his gets it from.  There fax number is [phone].

## 2021-11-06 ENCOUNTER — Ambulatory Visit (INDEPENDENT_AMBULATORY_CARE_PROVIDER_SITE_OTHER): Payer: Medicare HMO

## 2021-11-06 DIAGNOSIS — I428 Other cardiomyopathies: Secondary | ICD-10-CM

## 2021-11-06 DIAGNOSIS — Z95810 Presence of automatic (implantable) cardiac defibrillator: Secondary | ICD-10-CM | POA: Diagnosis not present

## 2021-11-06 LAB — CUP PACEART REMOTE DEVICE CHECK
Battery Remaining Longevity: 62 mo
Battery Voltage: 2.98 V
Brady Statistic AP VP Percent: 0.05 %
Brady Statistic AP VS Percent: 97.03 %
Brady Statistic AS VP Percent: 0 %
Brady Statistic AS VS Percent: 2.92 %
Brady Statistic RA Percent Paced: 96.68 %
Brady Statistic RV Percent Paced: 0.06 %
Date Time Interrogation Session: 20230110012303
HighPow Impedance: 71 Ohm
Implantable Lead Implant Date: 20190411
Implantable Lead Implant Date: 20190411
Implantable Lead Location: 753859
Implantable Lead Location: 753860
Implantable Lead Model: 5076
Implantable Lead Model: 6935
Implantable Pulse Generator Implant Date: 20190411
Lead Channel Impedance Value: 418 Ohm
Lead Channel Impedance Value: 475 Ohm
Lead Channel Impedance Value: 475 Ohm
Lead Channel Pacing Threshold Amplitude: 0.5 V
Lead Channel Pacing Threshold Amplitude: 0.75 V
Lead Channel Pacing Threshold Pulse Width: 0.4 ms
Lead Channel Pacing Threshold Pulse Width: 0.4 ms
Lead Channel Sensing Intrinsic Amplitude: 3.125 mV
Lead Channel Sensing Intrinsic Amplitude: 3.125 mV
Lead Channel Sensing Intrinsic Amplitude: 6.875 mV
Lead Channel Sensing Intrinsic Amplitude: 6.875 mV
Lead Channel Setting Pacing Amplitude: 2 V
Lead Channel Setting Pacing Amplitude: 2.5 V
Lead Channel Setting Pacing Pulse Width: 0.4 ms
Lead Channel Setting Sensing Sensitivity: 0.3 mV

## 2021-11-15 NOTE — Progress Notes (Signed)
Remote ICD transmission.   

## 2021-12-27 ENCOUNTER — Encounter: Payer: Self-pay | Admitting: *Deleted

## 2021-12-27 ENCOUNTER — Ambulatory Visit: Payer: Medicare HMO | Admitting: Cardiology

## 2021-12-27 ENCOUNTER — Encounter: Payer: Self-pay | Admitting: Cardiology

## 2021-12-27 VITALS — BP 108/74 | HR 72 | Ht 73.0 in | Wt 287.2 lb

## 2021-12-27 DIAGNOSIS — D6869 Other thrombophilia: Secondary | ICD-10-CM

## 2021-12-27 DIAGNOSIS — E782 Mixed hyperlipidemia: Secondary | ICD-10-CM

## 2021-12-27 DIAGNOSIS — I4729 Other ventricular tachycardia: Secondary | ICD-10-CM | POA: Diagnosis not present

## 2021-12-27 DIAGNOSIS — I5022 Chronic systolic (congestive) heart failure: Secondary | ICD-10-CM | POA: Diagnosis not present

## 2021-12-27 DIAGNOSIS — I4891 Unspecified atrial fibrillation: Secondary | ICD-10-CM

## 2021-12-27 NOTE — Patient Instructions (Addendum)
Medication Instructions:  Continue all current medications.   Labwork: none  Testing/Procedures: none  Follow-Up: 6 months   Any Other Special Instructions Will Be Listed Below (If Applicable).   If you need a refill on your cardiac medications before your next appointment, please call your pharmacy.  

## 2021-12-27 NOTE — Progress Notes (Signed)
Clinical Summary Craig Robinson is a 71 y.o.male seen for follow up of the following medical problems.      1. Chronic systolic heart failure - 06/2017 echo LVEF 25-30%, diffuse hypokinesis, grade I diastolic dysfunction - 06/2017 cath without obstructive CAD - 11/2017 LVEF 20-25% - AICD followed by Dr Ladona Ridgel   01/2021 echo LVEF 30-35%.  - no SOB/DOE. No recent edema - compliant with meds. Has not needed prn lasix.      2. NSVT/VT - AICD followed by EP - also on amio   -- no palpitations - Jan 2023 normal device check -      3. PAF  - no recent palpitations - no bleeding eliquis   4. Hyperlipidemia - labs followed byp cp     Past Medical History:  Diagnosis Date   AICD (automatic cardioverter/defibrillator) present 02/05/2018   Asthma    Hypertension      No Known Allergies   Current Outpatient Medications  Medication Sig Dispense Refill   amiodarone (PACERONE) 200 MG tablet TAKE 1 TABLET EVERY DAY 90 tablet 3   apixaban (ELIQUIS) 5 MG TABS tablet Take 1 tablet (5 mg total) by mouth 2 (two) times daily. 14 tablet 0   atorvastatin (LIPITOR) 40 MG tablet TAKE 1 TABLET EVERY DAY 90 tablet 3   carvedilol (COREG) 25 MG tablet TAKE 1/2 TABLET (12.5 MG) IN THE MORNING AND TAKE 1 TABLET (25 MG) IN THE EVENING 135 tablet 3   dapagliflozin propanediol (FARXIGA) 10 MG TABS tablet Take 1 tablet (10 mg total) by mouth daily before breakfast. 90 tablet 3   ENTRESTO 97-103 MG TAKE 1 TABLET TWICE DAILY 180 tablet 2   furosemide (LASIX) 40 MG tablet Take 40 mg by mouth daily as needed.     meloxicam (MOBIC) 7.5 MG tablet Take 7.5 mg by mouth daily.     MISC NATURAL PRODUCTS PO Take 1 tablet by mouth daily. Prostagenics     OVER THE COUNTER MEDICATION Take 2 tablets by mouth at bedtime. Pain Relief Tablets     sildenafil (VIAGRA) 100 MG tablet Take 100 mg by mouth daily as needed.     spironolactone (ALDACTONE) 25 MG tablet TAKE 1 TABLET EVERY DAY 90 tablet 1   tamsulosin  (FLOMAX) 0.4 MG CAPS capsule Take 0.4 mg by mouth daily. (Patient not taking: Reported on 06/25/2021)     No current facility-administered medications for this visit.     Past Surgical History:  Procedure Laterality Date   ICD IMPLANT N/A 02/05/2018   Procedure: ICD IMPLANT;  Surgeon: Marinus Maw, MD;  Location: [facility] INVASIVE CV LAB;  Service: Cardiovascular;  Laterality: N/A;   LEFT HEART CATH AND CORONARY ANGIOGRAPHY N/A 07/01/2017   Procedure: LEFT HEART CATH AND CORONARY ANGIOGRAPHY;  Surgeon: Corky Crafts, MD;  Location: [facility] INVASIVE CV LAB;  Service: Cardiovascular;  Laterality: N/A;     No Known Allergies    Family History  Problem Relation Age of Onset   Alzheimer's disease Mother    Cancer Father      Social History Mr. Sauceda reports that he has never smoked. He has never used smokeless tobacco. Mr. Chevez reports no history of alcohol use.   Review of Systems CONSTITUTIONAL: No weight loss, fever, chills, weakness or fatigue.  HEENT: Eyes: No visual loss, blurred vision, double vision or yellow sclerae.No hearing loss, sneezing, congestion, runny nose or sore throat.  SKIN: No rash or itching.  CARDIOVASCULAR: per hpi RESPIRATORY:  No shortness of breath, cough or sputum.  GASTROINTESTINAL: No anorexia, nausea, vomiting or diarrhea. No abdominal pain or blood.  GENITOURINARY: No burning on urination, no polyuria NEUROLOGICAL: No headache, dizziness, syncope, paralysis, ataxia, numbness or tingling in the extremities. No change in bowel or bladder control.  MUSCULOSKELETAL: No muscle, back pain, joint pain or stiffness.  LYMPHATICS: No enlarged nodes. No history of splenectomy.  PSYCHIATRIC: No history of depression or anxiety.  ENDOCRINOLOGIC: No reports of sweating, cold or heat intolerance. No polyuria or polydipsia.  Marland Kitchen   Physical Examination Today's Vitals   12/27/21 0842  BP: 108/74  Pulse: 72  SpO2: 99%  Weight: 287 lb 3.2 oz (130.3 kg)   Height: 6\' 1"  (1.854 m)   Body mass index is 37.89 kg/m.  Gen: resting comfortably, no acute distress HEENT: no scleral icterus, pupils equal round and reactive, no palptable cervical adenopathy,  CV: RRR, no m/r/g, no jvd Resp: Clear to auscultation bilaterally GI: abdomen is soft, non-tender, non-distended, normal bowel sounds, no hepatosplenomegaly MSK: extremities are warm, no edema.  Skin: warm, no rash Neuro:  no focal deficits Psych: appropriate affect   Diagnostic Studies  01/2021 echo 1. Left ventricular ejection fraction, by estimation, is 30 to 35%. The  left ventricle has moderately decreased function. The left ventricle  demonstrates global hypokinesis. The left ventricular internal cavity size  was mildly dilated. Left ventricular  diastolic parameters are consistent with Grade I diastolic dysfunction  (impaired relaxation). The average left ventricular global longitudinal  strain is 10.2 %. The global longitudinal strain is abnormal.   2. Right ventricular systolic function was not well visualized. The right  ventricular size is not well visualized.   3. The mitral valve was not well visualized. No evidence of mitral valve  regurgitation. No evidence of mitral stenosis.   4. The aortic valve has an indeterminant number of cusps. There is mild  calcification of the aortic valve. There is mild thickening of the aortic  valve. Aortic valve regurgitation is not visualized. No aortic stenosis is  present.   5. The inferior vena cava is normal in size with greater than 50%  respiratory variability, suggesting right atrial pressure of 3 mmHg.    Assessment and Plan  1. Chronic systolic HF - euvolemic, no recent symptoms - continue current medical therapy   2. NSVT/VT - has ICD in place, started on amiodarone by EP - no symptoms - recent normal device check - request labs from pcp to f/u on TFTs and LFTs on amio     3. Afib/acquitted thrombophilia - no  recent symptoms, continue current meds - continue eliquis for stroke prevention   4. Hyperlipidemia -continue current meds, request pcp labs        02/2021, M.D

## 2022-01-29 ENCOUNTER — Ambulatory Visit (INDEPENDENT_AMBULATORY_CARE_PROVIDER_SITE_OTHER): Payer: Medicare HMO | Admitting: Internal Medicine

## 2022-01-29 ENCOUNTER — Encounter: Payer: Self-pay | Admitting: Internal Medicine

## 2022-01-29 VITALS — BP 142/80 | HR 87 | Ht 73.0 in | Wt 280.4 lb

## 2022-01-29 DIAGNOSIS — I472 Ventricular tachycardia, unspecified: Secondary | ICD-10-CM

## 2022-01-29 NOTE — Progress Notes (Signed)
? ? ? ? ?HPI ?Mr. Asche returns today for followup.  He is a very pleasant 71 year old man with a history of chronic systolic heart failure, ventricular tachycardia, status post ICD insertion, obesity, and hypertension.  In the interim, he has had minimal dyspnea.  He admits to dietary indiscretion with sodium.  He has not lost weight but has gained some.  He has no significant peripheral edema.  He denies anginal symptoms.  He denies ICD therapies. ? ?No Known Allergies ? ? ?Current Outpatient Medications  ?Medication Sig Dispense Refill  ? amiodarone (PACERONE) 200 MG tablet TAKE 1 TABLET EVERY DAY 90 tablet 3  ? apixaban (ELIQUIS) 5 MG TABS tablet Take 1 tablet (5 mg total) by mouth 2 (two) times daily. 14 tablet 0  ? atorvastatin (LIPITOR) 40 MG tablet TAKE 1 TABLET EVERY DAY 90 tablet 3  ? carvedilol (COREG) 25 MG tablet TAKE 1/2 TABLET (12.5 MG) IN THE MORNING AND TAKE 1 TABLET (25 MG) IN THE EVENING 135 tablet 3  ? dapagliflozin propanediol (FARXIGA) 10 MG TABS tablet Take 1 tablet (10 mg total) by mouth daily before breakfast. 90 tablet 3  ? ENTRESTO 97-103 MG TAKE 1 TABLET TWICE DAILY 180 tablet 2  ? furosemide (LASIX) 40 MG tablet Take 40 mg by mouth daily as needed.    ? meloxicam (MOBIC) 15 MG tablet Take 1 tablet by mouth daily.    ? MISC NATURAL PRODUCTS PO Take 1 tablet by mouth daily. Prostagenics    ? OVER THE COUNTER MEDICATION Take 2 tablets by mouth at bedtime. Pain Relief Tablets    ? sildenafil (VIAGRA) 100 MG tablet Take 100 mg by mouth daily as needed.    ? spironolactone (ALDACTONE) 25 MG tablet TAKE 1 TABLET EVERY DAY 90 tablet 1  ? ?No current facility-administered medications for this visit.  ? ? ? ?Past Medical History:  ?Diagnosis Date  ? AICD (automatic cardioverter/defibrillator) present 02/05/2018  ? Asthma   ? Hypertension   ? ? ?ROS: ? ? All systems reviewed and negative except as noted in the HPI. ? ? ?Past Surgical History:  ?Procedure Laterality Date  ? ICD IMPLANT N/A  02/05/2018  ? Procedure: ICD IMPLANT;  Surgeon: Marinus Maw, MD;  Location: [facility] INVASIVE CV LAB;  Service: Cardiovascular;  Laterality: N/A;  ? LEFT HEART CATH AND CORONARY ANGIOGRAPHY N/A 07/01/2017  ? Procedure: LEFT HEART CATH AND CORONARY ANGIOGRAPHY;  Surgeon: Corky Crafts, MD;  Location: [facility] INVASIVE CV LAB;  Service: Cardiovascular;  Laterality: N/A;  ? ? ? ?Family History  ?Problem Relation Age of Onset  ? Alzheimer's disease Mother   ? Cancer Father   ? ? ? ?Social History  ? ?Socioeconomic History  ? Marital status: Single  ?  Spouse name: Not on file  ? Number of children: Not on file  ? Years of education: Not on file  ? Highest education level: Not on file  ?Occupational History  ? Not on file  ?Tobacco Use  ? Smoking status: Never  ? Smokeless tobacco: Never  ?Vaping Use  ? Vaping Use: Never used  ?Substance and Sexual Activity  ? Alcohol use: No  ? Drug use: No  ? Sexual activity: Not Currently  ?  Partners: Female  ?Other Topics Concern  ? Not on file  ?Social History Narrative  ? Not on file  ? ?Social Determinants of Health  ? ?Financial Resource Strain: Not on file  ?Food Insecurity: Not on file  ?Transportation  Needs: Not on file  ?Physical Activity: Not on file  ?Stress: Not on file  ?Social Connections: Not on file  ?Intimate Partner Violence: Not on file  ? ? ? ?BP (!) 142/80   Pulse 87   Ht 6\' 1"  (1.854 m)   Wt 280 lb 6.4 oz (127.2 kg)   SpO2 98%   BMI 36.99 kg/m?  ? ?Physical Exam: ? ?Well appearing but overweight 71 yo man, NAD ?HEENT: Unremarkable ?Neck:  No JVD, no thyromegally ?Lymphatics:  No adenopathy ?Back:  No CVA tenderness ?Lungs:  Clear ?HEART:  Regular rate rhythm, no murmurs, no rubs, no clicks ?Abd:  soft, positive bowel sounds, no organomegally, no rebound, no guarding ?Ext:  2 plus pulses, no edema, no cyanosis, no clubbing ?Skin:  No rashes no nodules ?Neuro:  CN II through XII intact, motor grossly intact ? ? ?DEVICE  ?Normal device function.  See PaceArt for  details.  ? ?Assess/Plan:  ?1.  Ventricular tachycardia -he has had no ICD shocks. No ATP since his last visit.  ?2.  Obesity.  I have encouraged the patient to lose weight. Fortunately he has lost over 20 lbs since his last visit. He admits to dietary indiscretion. ?3.  Chronic systolic heart failure -the patient's symptoms are class II.  His fluid index is elevated today.  I have asked the patient to take Lasix 40 mg daily for 2 days. He is not taking lasix regularly.   ?4.  ICD -his Medtronic dual-chamber ICD is working normally.  We will recheck in several months. Almost 7 years of battery longevity. ?  ?66 Tayelor Osborne,MD ?

## 2022-01-29 NOTE — Patient Instructions (Signed)
Medication Instructions:  ?You have been given samples of Eliquis 5 mg Lot# [id number] , Exp: March 2025 ?#28 pills  ? ?*If you need a refill on your cardiac medications before your next appointment, please call your pharmacy* ? ? ?Lab Work: ?NONE  ? ?If you have labs (blood work) drawn today and your tests are completely normal, you will receive your results only by: ?MyChart Message (if you have MyChart) OR ?A paper copy in the mail ?If you have any lab test that is abnormal or we need to change your treatment, we will call you to review the results. ? ? ?Testing/Procedures: ?NONE  ? ? ?Follow-Up: ?At [facility], you and your health needs are our priority.  As part of our continuing mission to provide you with exceptional heart care, we have created designated Provider Care Teams.  These Care Teams include your primary Cardiologist (physician) and Advanced Practice Providers (APPs -  Physician Assistants and Nurse Practitioners) who all work together to provide you with the care you need, when you need it. ? ?We recommend signing up for the patient portal called "MyChart".  Sign up information is provided on this After Visit Summary.  MyChart is used to connect with patients for Virtual Visits (Telemedicine).  Patients are able to view lab/test results, encounter notes, upcoming appointments, etc.  Non-urgent messages can be sent to your provider as well.   ?To learn more about what you can do with MyChart, go to [email or web address].   ? ?Your next appointment:   ?1 year(s) ? ?The format for your next appointment:   ?In Person ? ?Provider:   ?Lewayne Bunting, MD  ? ? ?Other Instructions ?Thank you for choosing [facility]! ?  ? ?

## 2022-02-05 ENCOUNTER — Ambulatory Visit (INDEPENDENT_AMBULATORY_CARE_PROVIDER_SITE_OTHER): Payer: Medicare HMO

## 2022-02-05 DIAGNOSIS — I472 Ventricular tachycardia, unspecified: Secondary | ICD-10-CM | POA: Diagnosis not present

## 2022-02-05 LAB — CUP PACEART REMOTE DEVICE CHECK
Battery Remaining Longevity: 60 mo
Battery Voltage: 2.98 V
Brady Statistic AP VP Percent: 0.05 %
Brady Statistic AP VS Percent: 93.75 %
Brady Statistic AS VP Percent: 0 %
Brady Statistic AS VS Percent: 6.2 %
Brady Statistic RA Percent Paced: 93.56 %
Brady Statistic RV Percent Paced: 0.06 %
Date Time Interrogation Session: 20230411033524
HighPow Impedance: 67 Ohm
Implantable Lead Implant Date: 20190411
Implantable Lead Implant Date: 20190411
Implantable Lead Location: 753859
Implantable Lead Location: 753860
Implantable Lead Model: 5076
Implantable Lead Model: 6935
Implantable Pulse Generator Implant Date: 20190411
Lead Channel Impedance Value: 361 Ohm
Lead Channel Impedance Value: 456 Ohm
Lead Channel Impedance Value: 456 Ohm
Lead Channel Pacing Threshold Amplitude: 0.5 V
Lead Channel Pacing Threshold Amplitude: 0.75 V
Lead Channel Pacing Threshold Pulse Width: 0.4 ms
Lead Channel Pacing Threshold Pulse Width: 0.4 ms
Lead Channel Sensing Intrinsic Amplitude: 2.375 mV
Lead Channel Sensing Intrinsic Amplitude: 2.375 mV
Lead Channel Sensing Intrinsic Amplitude: 6.25 mV
Lead Channel Sensing Intrinsic Amplitude: 6.25 mV
Lead Channel Setting Pacing Amplitude: 2 V
Lead Channel Setting Pacing Amplitude: 2.5 V
Lead Channel Setting Pacing Pulse Width: 0.4 ms
Lead Channel Setting Sensing Sensitivity: 0.3 mV

## 2022-02-07 DIAGNOSIS — Z87891 Personal history of nicotine dependence: Secondary | ICD-10-CM | POA: Diagnosis not present

## 2022-02-07 DIAGNOSIS — W57XXXA Bitten or stung by nonvenomous insect and other nonvenomous arthropods, initial encounter: Secondary | ICD-10-CM | POA: Diagnosis not present

## 2022-02-07 DIAGNOSIS — I10 Essential (primary) hypertension: Secondary | ICD-10-CM | POA: Diagnosis not present

## 2022-02-07 DIAGNOSIS — I4891 Unspecified atrial fibrillation: Secondary | ICD-10-CM | POA: Diagnosis not present

## 2022-02-07 DIAGNOSIS — Z6841 Body Mass Index (BMI) 40.0 and over, adult: Secondary | ICD-10-CM | POA: Diagnosis not present

## 2022-02-07 DIAGNOSIS — Z299 Encounter for prophylactic measures, unspecified: Secondary | ICD-10-CM | POA: Diagnosis not present

## 2022-02-19 ENCOUNTER — Telehealth: Payer: Self-pay | Admitting: Cardiovascular Disease

## 2022-02-19 MED ORDER — APIXABAN 5 MG PO TABS: 5.0000 mg | ORAL_TABLET | Freq: Two times a day (BID) | ORAL | 0 refills | Status: DC

## 2022-02-19 NOTE — Addendum Note (Signed)
Addended by: Eustace Moore on: 02/19/2022 02:55 PM ? ? Modules accepted: Orders ? ?

## 2022-02-19 NOTE — Telephone Encounter (Signed)
Pt c/o medication issue: ? ?1. Name of Medication: apixaban (ELIQUIS) 5 MG TABS tablet ? ?2. How are you currently taking this medication (dosage and times per day)? Take 1 tablet (5 mg total) by mouth 2 (two) times daily. ? ?3. Are you having a reaction (difficulty breathing--STAT)? no ? ?4. What is your medication issue? Calling to see if our office have any samples. Please advise  ? ? ?

## 2022-02-19 NOTE — Telephone Encounter (Signed)
Co-pay for eliquis from [employer] is $125 for 90 day supply. Says he is awaiting a refill and it should arrive on Monday. Advised that eliquis samples are available for pick up. ?

## 2022-02-19 NOTE — Telephone Encounter (Signed)
Request for Eliquis 5mg  samples: ?Indication: PAF ?Last office visit: 12/27/21  02/26/22 MD ?Scr: 1.01 on 10/15/21 ?Age: 71 ?Weight: 130.3kg ? ?Based on above findings Eliquis 5mg  twice daily is the appropriate dose.  OK to give Eliquis samples if available. ? ?

## 2022-02-21 NOTE — Progress Notes (Signed)
Remote ICD transmission.   

## 2022-03-09 ENCOUNTER — Other Ambulatory Visit: Payer: Self-pay | Admitting: Cardiology

## 2022-04-15 DIAGNOSIS — Z789 Other specified health status: Secondary | ICD-10-CM | POA: Diagnosis not present

## 2022-04-15 DIAGNOSIS — Z6839 Body mass index (BMI) 39.0-39.9, adult: Secondary | ICD-10-CM | POA: Diagnosis not present

## 2022-04-15 DIAGNOSIS — M1611 Unilateral primary osteoarthritis, right hip: Secondary | ICD-10-CM | POA: Diagnosis not present

## 2022-04-15 DIAGNOSIS — I10 Essential (primary) hypertension: Secondary | ICD-10-CM | POA: Diagnosis not present

## 2022-04-15 DIAGNOSIS — I4891 Unspecified atrial fibrillation: Secondary | ICD-10-CM | POA: Diagnosis not present

## 2022-04-15 DIAGNOSIS — M25551 Pain in right hip: Secondary | ICD-10-CM | POA: Diagnosis not present

## 2022-04-15 DIAGNOSIS — I5022 Chronic systolic (congestive) heart failure: Secondary | ICD-10-CM | POA: Diagnosis not present

## 2022-04-15 DIAGNOSIS — Z299 Encounter for prophylactic measures, unspecified: Secondary | ICD-10-CM | POA: Diagnosis not present

## 2022-04-15 DIAGNOSIS — R937 Abnormal findings on diagnostic imaging of other parts of musculoskeletal system: Secondary | ICD-10-CM | POA: Diagnosis not present

## 2022-05-07 ENCOUNTER — Ambulatory Visit (INDEPENDENT_AMBULATORY_CARE_PROVIDER_SITE_OTHER): Payer: Medicare HMO

## 2022-05-07 DIAGNOSIS — I472 Ventricular tachycardia, unspecified: Secondary | ICD-10-CM

## 2022-05-07 LAB — CUP PACEART REMOTE DEVICE CHECK
Battery Remaining Longevity: 57 mo
Battery Voltage: 2.97 V
Brady Statistic AP VP Percent: 0.05 %
Brady Statistic AP VS Percent: 96.84 %
Brady Statistic AS VP Percent: 0 %
Brady Statistic AS VS Percent: 3.11 %
Brady Statistic RA Percent Paced: 96.53 %
Brady Statistic RV Percent Paced: 0.05 %
Date Time Interrogation Session: 20230711043823
HighPow Impedance: 73 Ohm
Implantable Lead Implant Date: 20190411
Implantable Lead Implant Date: 20190411
Implantable Lead Location: 753859
Implantable Lead Location: 753860
Implantable Lead Model: 5076
Implantable Lead Model: 6935
Implantable Pulse Generator Implant Date: 20190411
Lead Channel Impedance Value: 399 Ohm
Lead Channel Impedance Value: 475 Ohm
Lead Channel Impedance Value: 475 Ohm
Lead Channel Pacing Threshold Amplitude: 0.5 V
Lead Channel Pacing Threshold Amplitude: 0.75 V
Lead Channel Pacing Threshold Pulse Width: 0.4 ms
Lead Channel Pacing Threshold Pulse Width: 0.4 ms
Lead Channel Sensing Intrinsic Amplitude: 3.5 mV
Lead Channel Sensing Intrinsic Amplitude: 3.5 mV
Lead Channel Sensing Intrinsic Amplitude: 6.875 mV
Lead Channel Sensing Intrinsic Amplitude: 6.875 mV
Lead Channel Setting Pacing Amplitude: 2 V
Lead Channel Setting Pacing Amplitude: 2.5 V
Lead Channel Setting Pacing Pulse Width: 0.4 ms
Lead Channel Setting Sensing Sensitivity: 0.3 mV

## 2022-05-09 ENCOUNTER — Telehealth: Payer: Self-pay | Admitting: Cardiology

## 2022-05-09 NOTE — Telephone Encounter (Signed)
patient is requesting to speak with someone in regards to a fax that was sent over regarding Eliquis

## 2022-05-09 NOTE — Telephone Encounter (Signed)
  Pt is returning call. He gave phone# [phone]

## 2022-05-09 NOTE — Telephone Encounter (Signed)
Contacted office to see if [employer]-PAF paperwork received by our office that was sent by company on Monday.  Advised that application received by Craig Robinson and he is to bring proof of income and 2023 prescription out of pocket expense. Verbalized understanding of plan.

## 2022-05-15 DIAGNOSIS — M450 Ankylosing spondylitis of multiple sites in spine: Secondary | ICD-10-CM | POA: Diagnosis not present

## 2022-05-15 DIAGNOSIS — I5022 Chronic systolic (congestive) heart failure: Secondary | ICD-10-CM | POA: Diagnosis not present

## 2022-05-15 DIAGNOSIS — Z299 Encounter for prophylactic measures, unspecified: Secondary | ICD-10-CM | POA: Diagnosis not present

## 2022-05-15 DIAGNOSIS — I10 Essential (primary) hypertension: Secondary | ICD-10-CM | POA: Diagnosis not present

## 2022-05-15 DIAGNOSIS — Z713 Dietary counseling and surveillance: Secondary | ICD-10-CM | POA: Diagnosis not present

## 2022-05-20 DIAGNOSIS — H5213 Myopia, bilateral: Secondary | ICD-10-CM | POA: Diagnosis not present

## 2022-05-20 DIAGNOSIS — E7800 Pure hypercholesterolemia, unspecified: Secondary | ICD-10-CM | POA: Diagnosis not present

## 2022-05-20 DIAGNOSIS — I10 Essential (primary) hypertension: Secondary | ICD-10-CM | POA: Diagnosis not present

## 2022-05-22 ENCOUNTER — Telehealth: Payer: Self-pay

## 2022-05-22 DIAGNOSIS — Z596 Low income: Secondary | ICD-10-CM

## 2022-05-22 NOTE — Progress Notes (Signed)
[facility])  [place] Team    05/22/2022  Oneill Bais 03-17-1951 [id number]  Reason for referral: Medication Assistance with Eliquis  Referral source:  Care Management per request of Oralia Rud with [facility] Current insurance: [employer]  PMHx includes but not limited to:  ***  Outreach:  Successful {THN outreach options:22163} with ***.  HIPAA identifiers verified.   Subjective:  ***    Objective: The ASCVD Risk score (Arnett DK, et al., 2019) failed to calculate for the following reasons:   The patient has a prior MI or stroke diagnosis  Lab Results  Component Value Date   CREATININE 1.34 (H) 08/24/2021   CREATININE 1.49 (H) 07/26/2021   CREATININE 0.75 09/28/2018    Lab Results  Component Value Date   HGBA1C 5.4 07/01/2017    Lipid Panel  No results found for: "CHOL", "TRIG", "HDL", "CHOLHDL", "VLDL", "LDLCALC", "LDLDIRECT"  BP Readings from Last 3 Encounters:  01/29/22 (!) 142/80  12/27/21 108/74  06/25/21 100/68    No Known Allergies  Medications Reviewed Today     Reviewed by Berlinda Last, CMA (Certified Medical Assistant) on 01/29/22 at 1012  Med List Status: <None>   Medication Order Taking? Sig Documenting Provider Last Dose Status Informant  amiodarone (PACERONE) 200 MG tablet [id number] Yes TAKE 1 TABLET EVERY DAY Branch, Alphonse Guild, MD Taking Active   apixaban (ELIQUIS) 5 MG TABS tablet [id number] Yes Take 1 tablet (5 mg total) by mouth 2 (two) times daily. Arnoldo Lenis, MD Taking Active   atorvastatin (LIPITOR) 40 MG tablet [id number] Yes TAKE 1 TABLET EVERY DAY Branch, Alphonse Guild, MD Taking Active   carvedilol (COREG) 25 MG tablet [id number] Yes TAKE 1/2 TABLET (12.5 MG) IN THE MORNING AND TAKE 1 TABLET (25 MG) IN THE EVENING Branch, Alphonse Guild, MD Taking Active   dapagliflozin propanediol (FARXIGA) 10 MG TABS tablet [id number] Yes Take 1 tablet (10 mg total) by mouth daily before breakfast. Arnoldo Lenis, MD Taking Active   ENTRESTO 97-103 MG [id number] Yes TAKE 1 TABLET TWICE DAILY Branch, Alphonse Guild, MD Taking Active   furosemide (LASIX) 40 MG tablet [id number] Yes Take 40 mg by mouth daily as needed. [provider] Taking Active   meloxicam (MOBIC) 15 MG tablet [id number] Yes Take 1 tablet by mouth daily. [provider] Taking Active   MISC NATURAL PRODUCTS PO [id number] Yes Take 1 tablet by mouth daily. Prostagenics [provider] Taking Active   OVER THE COUNTER MEDICATION [id number] Yes Take 2 tablets by mouth at bedtime. Pain Relief Tablets [provider] Taking Active   sildenafil (VIAGRA) 100 MG tablet [id number] Yes Take 100 mg by mouth daily as needed. [provider] Taking Active   spironolactone (ALDACTONE) 25 MG tablet [id number] Yes TAKE 1 TABLET EVERY DAY Branch, Alphonse Guild, MD Taking Active             Assessment:    Medication Review Findings:  ***   Medication Adherence Findings: Adherence Review  '[]'  Excellent (no doses missed/week)     '[]'  Good (no more than 1 dose missed/week)     '[]'  Partial (2-3 doses missed/week) '[]'  Poor (>3 doses missed/week)  Patient with {excellent/good/fair/poor:19665} understanding of regimen and {excellent/good/fair/poor:19665} understanding of indications.    Medication Assistance Findings:  {Medication assistance THN:22160}  Extra Help:  {Extra Help:22425}  Patient Assistance Programs: *** made by *** Income requirement met: {Yes/No/Unknown:304600602} Out-of-pocket prescription expenditure met:   {yes no known no  applicable:22426} {THN patient assistance program assessment:22161}   *** made by *** Income requirement met: {Yes/No/Unknown:304600602} Out-of-pocket prescription expenditure met:   {yes no known no applicable:22426} {THN patient assistance program assessment:22161}   *** made by *** Income requirement met: {Yes/No/Unknown:304600602} Out-of-pocket  prescription expenditure met:   {yes no known no applicable:22426} {THN patient assistance program assessment:22161}    Additional medication assistance options reviewed with patient as warranted:  {Additional mediction assistance options:21932}  Plan: {THN plan options:22162}

## 2022-05-29 ENCOUNTER — Telehealth: Payer: Self-pay | Admitting: Pharmacy Technician

## 2022-05-29 DIAGNOSIS — Z596 Low income: Secondary | ICD-10-CM

## 2022-05-29 NOTE — Progress Notes (Signed)
[facility])                                            [facility] Quality Pharmacy Team    05/29/2022  Kalob Bergen 04-01-1951 [id number]                                      Medication Assistance Referral  Referral From:  [facility]  Medication/Company: Sherryll Burger / [employer] Patient application portion:  Mailed Provider application portion: Faxed  to Dr. Dina Rich Provider address/fax verified via: Office website  Medication/Company: Eliquis / [employer] Patient application portion:  Mailed Provider application portion: Faxed  to Dr. Dina Rich Provider address/fax verified via: Office website   Giovanna Kemmerer P. Martrice Apt, CPhT [facility] [employer]  [phone]

## 2022-05-29 NOTE — Progress Notes (Signed)
Remote ICD transmission.   

## 2022-05-29 NOTE — Progress Notes (Signed)
Patient states that he has already received a 7 day supply from Dr. Arta Bruce.

## 2022-05-30 ENCOUNTER — Other Ambulatory Visit: Payer: Self-pay | Admitting: *Deleted

## 2022-05-30 MED ORDER — ENTRESTO 97-103 MG PO TABS: 1.0000 | ORAL_TABLET | Freq: Two times a day (BID) | ORAL | 3 refills | Status: DC

## 2022-05-30 MED ORDER — APIXABAN 5 MG PO TABS: 5.0000 mg | ORAL_TABLET | Freq: Two times a day (BID) | ORAL | 3 refills | Status: DC

## 2022-06-18 ENCOUNTER — Telehealth: Payer: Self-pay | Admitting: Pharmacy Technician

## 2022-06-18 DIAGNOSIS — Z596 Low income: Secondary | ICD-10-CM

## 2022-06-18 NOTE — Progress Notes (Signed)
[facility])                                            [facility] Quality Pharmacy Team    06/18/2022  Craig Robinson Nov 25, 1950 [id number]  Received both patient and provider portion(s) of patient assistance application(s) for Eliquis and Entresto. Faxed completed application and required documents into [employer] and [employer].   Craig Robinson P. Craig Robinson, CPhT [facility] [employer]  [phone]

## 2022-06-21 ENCOUNTER — Telehealth: Payer: Self-pay | Admitting: Pharmacy Technician

## 2022-06-21 DIAGNOSIS — Z596 Low income: Secondary | ICD-10-CM

## 2022-06-21 NOTE — Progress Notes (Signed)
[facility])                                            [facility] Quality Pharmacy Team    06/21/2022  Elder Davidian 08/19/1951 [id number]  Two care coordination calls placed as outlined below.  Care coordination call placed to [employer] in regard to Eliquis application. Spoke to Morrell Riddle who informs patient is APPROVED 06/19/22-10/27/22. She informs medication will be delivered to the patient's home. Patient is aware of his approval.   Second care coordination call placed to [employer] in regard to [facility] application. Spoke to [place] who informs the application is processing and no determination has been made at this time.  Bud Kaeser P. Osiel Stick, CPhT [facility] [employer]  [phone]

## 2022-06-27 ENCOUNTER — Telehealth: Payer: Self-pay | Admitting: Pharmacy Technician

## 2022-06-27 DIAGNOSIS — Z596 Low income: Secondary | ICD-10-CM

## 2022-06-27 NOTE — Progress Notes (Signed)
[facility])                                            [facility] Quality Pharmacy Team    06/27/2022  Craig Robinson 1951-10-21 [id number]  Care coordination call placed to [employer] in regard to [facility] application.  Spoke to [place] who informs patient is APPROVED 06/21/22-10/27/22. She informs medication will be delivered to patient's home. Patient is aware of his approval.  Gisselle Galvis P. Margret Moat, CPhT [facility] [employer]  [phone]

## 2022-07-08 ENCOUNTER — Ambulatory Visit: Payer: Medicare HMO | Attending: Cardiology | Admitting: Cardiology

## 2022-07-08 ENCOUNTER — Encounter: Payer: Self-pay | Admitting: Cardiology

## 2022-07-08 VITALS — BP 120/70 | HR 76 | Ht 73.0 in | Wt 280.6 lb

## 2022-07-08 DIAGNOSIS — D6869 Other thrombophilia: Secondary | ICD-10-CM

## 2022-07-08 DIAGNOSIS — I5022 Chronic systolic (congestive) heart failure: Secondary | ICD-10-CM | POA: Diagnosis not present

## 2022-07-08 DIAGNOSIS — E782 Mixed hyperlipidemia: Secondary | ICD-10-CM | POA: Diagnosis not present

## 2022-07-08 DIAGNOSIS — I4891 Unspecified atrial fibrillation: Secondary | ICD-10-CM

## 2022-07-08 DIAGNOSIS — I472 Ventricular tachycardia, unspecified: Secondary | ICD-10-CM | POA: Diagnosis not present

## 2022-07-08 NOTE — Progress Notes (Signed)
Clinical Summary Craig Robinson is a 71 y.o.male seen for follow up of the following medical problems.      1. Chronic systolic heart failure - 06/2017 echo LVEF 25-30%, diffuse hypokinesis, grade I diastolic dysfunction - 06/2017 cath without obstructive CAD - 11/2017 LVEF 20-25% - AICD followed by Dr Ladona Ridgel   01/2021 echo LVEF 30-35%.    - no SOB/DOE, no recent edema - compliant with meds - home weight 280-286 lbs and stbale. Has not needed his prn lasix.  - discussed coming off mobic     2. NSVT/VT - AICD followed by EP - also on amio   -- denies palpitaitons - 04/2022 normal device check - upcoming labs with pcp     3. PAF  -no palpitations - no bleeding on eliquis  4. Hyperlipidemia - labs followed byp cp   5.HTN - has not taken meds yet today -       Past Medical History:  Diagnosis Date   AICD (automatic cardioverter/defibrillator) present 02/05/2018   Asthma    Hypertension      No Known Allergies   Current Outpatient Medications  Medication Sig Dispense Refill   amiodarone (PACERONE) 200 MG tablet TAKE 1 TABLET EVERY DAY 90 tablet 3   apixaban (ELIQUIS) 5 MG TABS tablet Take 1 tablet (5 mg total) by mouth 2 (two) times daily. 180 tablet 3   atorvastatin (LIPITOR) 40 MG tablet TAKE 1 TABLET EVERY DAY 90 tablet 3   carvedilol (COREG) 25 MG tablet TAKE 1/2 TABLET (12.5 MG) IN THE MORNING AND TAKE 1 TABLET (25 MG) IN THE EVENING 135 tablet 3   dapagliflozin propanediol (FARXIGA) 10 MG TABS tablet Take 1 tablet (10 mg total) by mouth daily before breakfast. 90 tablet 3   furosemide (LASIX) 40 MG tablet Take 40 mg by mouth daily as needed.     meloxicam (MOBIC) 15 MG tablet Take 1 tablet by mouth daily.     MISC NATURAL PRODUCTS PO Take 1 tablet by mouth daily. Prostagenics     OVER THE COUNTER MEDICATION Take 2 tablets by mouth at bedtime. Pain Relief Tablets     sacubitril-valsartan (ENTRESTO) 97-103 MG Take 1 tablet by mouth 2 (two) times  daily. 180 tablet 3   sildenafil (VIAGRA) 100 MG tablet Take 100 mg by mouth daily as needed.     spironolactone (ALDACTONE) 25 MG tablet TAKE 1 TABLET EVERY DAY 90 tablet 1   No current facility-administered medications for this visit.     Past Surgical History:  Procedure Laterality Date   ICD IMPLANT N/A 02/05/2018   Procedure: ICD IMPLANT;  Surgeon: Marinus Maw, MD;  Location: [facility] INVASIVE CV LAB;  Service: Cardiovascular;  Laterality: N/A;   LEFT HEART CATH AND CORONARY ANGIOGRAPHY N/A 07/01/2017   Procedure: LEFT HEART CATH AND CORONARY ANGIOGRAPHY;  Surgeon: Corky Crafts, MD;  Location: [facility] INVASIVE CV LAB;  Service: Cardiovascular;  Laterality: N/A;     No Known Allergies    Family History  Problem Relation Age of Onset   Alzheimer's disease Mother    Cancer Father      Social History Mr. Gruenberg reports that he has never smoked. He has never used smokeless tobacco. Mr. Shrout reports no history of alcohol use.   Review of Systems CONSTITUTIONAL: No weight loss, fever, chills, weakness or fatigue.  HEENT: Eyes: No visual loss, blurred vision, double vision or yellow sclerae.No hearing loss, sneezing, congestion, runny nose or sore  throat.  SKIN: No rash or itching.  CARDIOVASCULAR: per hpi RESPIRATORY: No shortness of breath, cough or sputum.  GASTROINTESTINAL: No anorexia, nausea, vomiting or diarrhea. No abdominal pain or blood.  GENITOURINARY: No burning on urination, no polyuria NEUROLOGICAL: No headache, dizziness, syncope, paralysis, ataxia, numbness or tingling in the extremities. No change in bowel or bladder control.  MUSCULOSKELETAL: No muscle, back pain, joint pain or stiffness.  LYMPHATICS: No enlarged nodes. No history of splenectomy.  PSYCHIATRIC: No history of depression or anxiety.  ENDOCRINOLOGIC: No reports of sweating, cold or heat intolerance. No polyuria or polydipsia.  Marland Kitchen   Physical Examination Vitals:   07/08/22 0829 07/08/22  0858  BP: (!) 140/98 120/70  Pulse: 76   SpO2: 97%    Filed Weights   07/08/22 0829  Weight: 280 lb 9.6 oz (127.3 kg)    Gen: resting comfortably, no acute distress HEENT: no scleral icterus, pupils equal round and reactive, no palptable cervical adenopathy,  CV: RRR, no m/r/g no jvd Resp: Clear to auscultation bilaterally GI: abdomen is soft, non-tender, non-distended, normal bowel sounds, no hepatosplenomegaly MSK: extremities are warm, no edema.  Skin: warm, no rash Neuro:  no focal deficits Psych: appropriate affect   Diagnostic Studies  01/2021 echo 1. Left ventricular ejection fraction, by estimation, is 30 to 35%. The  left ventricle has moderately decreased function. The left ventricle  demonstrates global hypokinesis. The left ventricular internal cavity size  was mildly dilated. Left ventricular  diastolic parameters are consistent with Grade I diastolic dysfunction  (impaired relaxation). The average left ventricular global longitudinal  strain is 10.2 %. The global longitudinal strain is abnormal.   2. Right ventricular systolic function was not well visualized. The right  ventricular size is not well visualized.   3. The mitral valve was not well visualized. No evidence of mitral valve  regurgitation. No evidence of mitral stenosis.   4. The aortic valve has an indeterminant number of cusps. There is mild  calcification of the aortic valve. There is mild thickening of the aortic  valve. Aortic valve regurgitation is not visualized. No aortic stenosis is  present.   5. The inferior vena cava is normal in size with greater than 50%  respiratory variability, suggesting right atrial pressure of 3 mmHg.    Assessment and Plan   1. Chronic systolic HF - no recent symptoms, on optimal medication regimen - continue current meds  2. NSVT/VT - has ICD in place, started on amiodarone by EP - no symptoms,.  - upcoming labs with pcp, f/u TFTs and LFTs while on  amio     3. Afib/acquitted thrombophilia - no symptoms, remains on amio - continue current meds including eliqiuis for stroke prevention - EKG today shows A paced V sensed   4. Hyperlipidemia -request pcp labs   F/u 6 months   Antoine Poche, M.D.

## 2022-07-08 NOTE — Patient Instructions (Signed)
Medication Instructions:  Continue all current medications.   Labwork: none  Testing/Procedures: none  Follow-Up: 6 months   Any Other Special Instructions Will Be Listed Below (If Applicable).   If you need a refill on your cardiac medications before your next appointment, please call your pharmacy.  

## 2022-07-09 ENCOUNTER — Encounter: Payer: Self-pay | Admitting: *Deleted

## 2022-07-12 DIAGNOSIS — I10 Essential (primary) hypertension: Secondary | ICD-10-CM | POA: Diagnosis not present

## 2022-07-12 DIAGNOSIS — Z789 Other specified health status: Secondary | ICD-10-CM | POA: Diagnosis not present

## 2022-07-12 DIAGNOSIS — Z23 Encounter for immunization: Secondary | ICD-10-CM | POA: Diagnosis not present

## 2022-07-12 DIAGNOSIS — M25561 Pain in right knee: Secondary | ICD-10-CM | POA: Diagnosis not present

## 2022-07-12 DIAGNOSIS — Z299 Encounter for prophylactic measures, unspecified: Secondary | ICD-10-CM | POA: Diagnosis not present

## 2022-07-12 DIAGNOSIS — Z6841 Body Mass Index (BMI) 40.0 and over, adult: Secondary | ICD-10-CM | POA: Diagnosis not present

## 2022-08-02 ENCOUNTER — Telehealth: Payer: Self-pay | Admitting: Pharmacy Technician

## 2022-08-02 ENCOUNTER — Other Ambulatory Visit: Payer: Self-pay | Admitting: Cardiology

## 2022-08-02 DIAGNOSIS — Z596 Low income: Secondary | ICD-10-CM

## 2022-08-02 NOTE — Progress Notes (Signed)
[facility])                                            [place] Team    08/02/2022  Craig Robinson [birth year] [id number]                                      Medication Assistance Referral-FOR 2024 RE ENROLLMENT  Referral From:  [facility]  Randa Ngo [name]  Medication/Company: Delene Loll / [employer] Patient application portion:  Mailed Provider application portion: Faxed  to Dr. Carlyle Dolly Provider address/fax verified via:  Mailed  Medication/Company: Wilder Glade / [employer] Patient application portion:  Mailed Provider application portion: Faxed  to Dr. Carlyle Dolly Provider address/fax verified via: Office website  Mataio Mele P. [name], [place]  [phone]

## 2022-08-06 ENCOUNTER — Ambulatory Visit: Payer: Medicare HMO | Attending: Internal Medicine

## 2022-08-06 DIAGNOSIS — I5022 Chronic systolic (congestive) heart failure: Secondary | ICD-10-CM

## 2022-08-06 LAB — CUP PACEART REMOTE DEVICE CHECK
Battery Remaining Longevity: 49 mo
Battery Voltage: 2.97 V
Brady Statistic AP VP Percent: 0.05 %
Brady Statistic AP VS Percent: 97.52 %
Brady Statistic AS VP Percent: 0 %
Brady Statistic AS VS Percent: 2.43 %
Brady Statistic RA Percent Paced: 97.2 %
Brady Statistic RV Percent Paced: 0.05 %
Date Time Interrogation Session: 20231010043823
HighPow Impedance: 67 Ohm
Implantable Lead Implant Date: 20190411
Implantable Lead Implant Date: 20190411
Implantable Lead Location: 753859
Implantable Lead Location: 753860
Implantable Lead Model: 5076
Implantable Lead Model: 6935
Implantable Pulse Generator Implant Date: 20190411
Lead Channel Impedance Value: 399 Ohm
Lead Channel Impedance Value: 418 Ohm
Lead Channel Impedance Value: 456 Ohm
Lead Channel Pacing Threshold Amplitude: 0.5 V
Lead Channel Pacing Threshold Amplitude: 0.75 V
Lead Channel Pacing Threshold Pulse Width: 0.4 ms
Lead Channel Pacing Threshold Pulse Width: 0.4 ms
Lead Channel Sensing Intrinsic Amplitude: 2.875 mV
Lead Channel Sensing Intrinsic Amplitude: 2.875 mV
Lead Channel Sensing Intrinsic Amplitude: 6.125 mV
Lead Channel Sensing Intrinsic Amplitude: 6.125 mV
Lead Channel Setting Pacing Amplitude: 2 V
Lead Channel Setting Pacing Amplitude: 2.5 V
Lead Channel Setting Pacing Pulse Width: 0.4 ms
Lead Channel Setting Sensing Sensitivity: 0.3 mV

## 2022-08-13 ENCOUNTER — Other Ambulatory Visit: Payer: Self-pay | Admitting: *Deleted

## 2022-08-13 MED ORDER — ENTRESTO 97-103 MG PO TABS: 1.0000 | ORAL_TABLET | Freq: Two times a day (BID) | ORAL | 3 refills | Status: DC

## 2022-08-20 DIAGNOSIS — I4891 Unspecified atrial fibrillation: Secondary | ICD-10-CM | POA: Diagnosis not present

## 2022-08-20 DIAGNOSIS — M450 Ankylosing spondylitis of multiple sites in spine: Secondary | ICD-10-CM | POA: Diagnosis not present

## 2022-08-20 DIAGNOSIS — N400 Enlarged prostate without lower urinary tract symptoms: Secondary | ICD-10-CM | POA: Diagnosis not present

## 2022-08-20 DIAGNOSIS — I10 Essential (primary) hypertension: Secondary | ICD-10-CM | POA: Diagnosis not present

## 2022-08-20 DIAGNOSIS — Z299 Encounter for prophylactic measures, unspecified: Secondary | ICD-10-CM | POA: Diagnosis not present

## 2022-08-20 NOTE — Progress Notes (Signed)
Remote ICD transmission.   

## 2022-08-28 ENCOUNTER — Ambulatory Visit: Payer: Medicare HMO | Admitting: Urology

## 2022-08-28 ENCOUNTER — Encounter: Payer: Self-pay | Admitting: Urology

## 2022-08-28 VITALS — BP 107/65 | HR 87

## 2022-08-28 DIAGNOSIS — N401 Enlarged prostate with lower urinary tract symptoms: Secondary | ICD-10-CM

## 2022-08-28 DIAGNOSIS — N400 Enlarged prostate without lower urinary tract symptoms: Secondary | ICD-10-CM | POA: Diagnosis not present

## 2022-08-28 DIAGNOSIS — R351 Nocturia: Secondary | ICD-10-CM | POA: Diagnosis not present

## 2022-08-28 LAB — URINALYSIS, ROUTINE W REFLEX MICROSCOPIC
Bilirubin, UA: NEGATIVE
Ketones, UA: NEGATIVE
Leukocytes,UA: NEGATIVE
Nitrite, UA: NEGATIVE
Protein,UA: NEGATIVE
RBC, UA: NEGATIVE
Specific Gravity, UA: 1.02 (ref 1.005–1.030)
Urobilinogen, Ur: 0.2 mg/dL (ref 0.2–1.0)
pH, UA: 5.5 (ref 5.0–7.5)

## 2022-08-28 LAB — MICROSCOPIC EXAMINATION: RBC, Urine: NONE SEEN /hpf (ref 0–2)

## 2022-08-28 MED ORDER — ALFUZOSIN HCL ER 10 MG PO TB24: 10.0000 mg | ORAL_TABLET | Freq: Every day | ORAL | 11 refills | Status: DC

## 2022-08-28 NOTE — Progress Notes (Signed)
08/28/2022 8:55 AM   Craig Robinson 1951/06/25 [id number]  Referring provider: Monico Blitz, MD [address] [place],  [place] [address]  Nocturia   HPI: Mr Kentner is a 71yo here for evaluation of nocturia. IPSS 21 QOL 5. Over the past year he has noted increased nocturia 7-8x, a weaker urinary stream, straining to urinate, urinary frequency and occasional urge incontinence. We was recently started on flomax BID which improved his nocturia to 4-5x. Stream is slightly better. He is currently on farxiga and has been on the medication for over 1 year.    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   Asthma    Hypertension     Surgical History: Past Surgical History:  Procedure Laterality Date   ICD IMPLANT N/A 02/05/2018   Procedure: ICD IMPLANT;  Surgeon: Evans Lance, MD;  Location: [place] CV LAB;  Service: Cardiovascular;  Laterality: N/A;   LEFT HEART CATH AND CORONARY ANGIOGRAPHY N/A 07/01/2017   Procedure: LEFT HEART CATH AND CORONARY ANGIOGRAPHY;  Surgeon: Jettie Booze, MD;  Location: [place] CV LAB;  Service: Cardiovascular;  Laterality: N/A;    Home Medications:  Allergies as of 08/28/2022   No Known Allergies      Medication List        Accurate as of August 28, 2022  8:55 AM. If you have any questions, ask your nurse or doctor.          amiodarone 200 MG tablet Commonly known as: PACERONE TAKE 1 TABLET EVERY DAY   apixaban 5 MG Tabs tablet Commonly known as: Eliquis Take 1 tablet (5 mg total) by mouth 2 (two) times daily.   atorvastatin 40 MG tablet Commonly known as: LIPITOR TAKE 1 TABLET EVERY DAY   carvedilol 25 MG tablet Commonly known as: COREG TAKE 1/2 TABLET IN THE MORNING  AND TAKE 1 TABLET IN THE EVENING   dapagliflozin propanediol 10 MG Tabs tablet Commonly known as: Farxiga Take 1 tablet (10 mg total) by mouth daily before breakfast.   Entresto  97-103 MG Generic drug: sacubitril-valsartan Take 1 tablet by mouth 2 (two) times daily.   furosemide 40 MG tablet Commonly known as: LASIX Take 40 mg by mouth daily as needed.   meloxicam 15 MG tablet Commonly known as: MOBIC Take 1 tablet by mouth daily.   MISC NATURAL PRODUCTS PO Take 1 tablet by mouth daily. Prostagenics   OVER THE COUNTER MEDICATION Take 2 tablets by mouth at bedtime. Pain Relief Tablets   sildenafil 100 MG tablet Commonly known as: VIAGRA Take 100 mg by mouth daily as needed.   spironolactone 25 MG tablet Commonly known as: ALDACTONE TAKE 1 TABLET EVERY DAY   tamsulosin 0.4 MG Caps capsule Commonly known as: FLOMAX Take 0.4 mg by mouth 2 (two) times daily.        Allergies: No Known Allergies  Family History: Family History  Problem Relation Age of Onset   Alzheimer's disease Mother    Cancer Father     Social History:  reports that he has never smoked. He has never used smokeless tobacco. He reports that he does not drink alcohol and does not use drugs.  ROS: All other review of systems were reviewed and are negative except what is noted above in HPI  Physical Exam: BP 107/65   Pulse 87   Constitutional:  Alert and oriented, No acute distress. HEENT: [place] AT, moist mucus membranes.  Trachea  midline, no masses. Cardiovascular: No clubbing, cyanosis, or edema. Respiratory: Normal respiratory effort, no increased work of breathing. GI: Abdomen is soft, nontender, nondistended, no abdominal masses GU: No CVA tenderness. Circumcised phallus. No masses/lesions on penis, testis, scrotum. Prostate 40g smooth no nodules no induration.  Lymph: No cervical or inguinal lymphadenopathy. Skin: No rashes, bruises or suspicious lesions. Neurologic: Grossly intact, no focal deficits, moving all 4 extremities. Psychiatric: Normal mood and affect.  Laboratory Data: Lab Results  Component Value Date   WBC 4.6 07/26/2021   HGB 12.8 (L) 07/26/2021    HCT 39.3 07/26/2021   MCV 105.4 (H) 07/26/2021   PLT 192 07/26/2021    Lab Results  Component Value Date   CREATININE 1.34 (H) 08/24/2021    No results found for: "PSA"  No results found for: "TESTOSTERONE"  Lab Results  Component Value Date   HGBA1C 5.4 07/01/2017    Urinalysis No results found for: "COLORURINE", "APPEARANCEUR", "LABSPEC", "PHURINE", "GLUCOSEU", "HGBUR", "BILIRUBINUR", "KETONESUR", "PROTEINUR", "UROBILINOGEN", "NITRITE", "LEUKOCYTESUR"  No results found for: "LABMICR", "WBCUA", "RBCUA", "LABEPIT", "MUCUS", "BACTERIA"  Pertinent Imaging:  No results found for this or any previous visit.  No results found for this or any previous visit.  No results found for this or any previous visit.  No results found for this or any previous visit.  No results found for this or any previous visit.  No valid procedures specified. No results found for this or any previous visit.  No results found for this or any previous visit.   Assessment & Plan:    1. Benign prostatic hyperplasia, unspecified whether lower urinary tract symptoms present We will trial uroxatral 10mg  qhs - Urinalysis, Routine w reflex microscopic  2. Nocturia -We will start uroxatral 10mg  qhs   No follow-ups on file.  , MD  [facility] [place]

## 2022-08-28 NOTE — Progress Notes (Signed)
post void residual=1 

## 2022-08-28 NOTE — Patient Instructions (Signed)

## 2022-10-01 ENCOUNTER — Encounter: Payer: Self-pay | Admitting: Urology

## 2022-10-01 ENCOUNTER — Ambulatory Visit (INDEPENDENT_AMBULATORY_CARE_PROVIDER_SITE_OTHER): Payer: Medicare HMO | Admitting: Urology

## 2022-10-01 VITALS — BP 118/76 | HR 102

## 2022-10-01 DIAGNOSIS — N400 Enlarged prostate without lower urinary tract symptoms: Secondary | ICD-10-CM

## 2022-10-01 DIAGNOSIS — R351 Nocturia: Secondary | ICD-10-CM

## 2022-10-01 LAB — URINALYSIS, ROUTINE W REFLEX MICROSCOPIC
Bilirubin, UA: NEGATIVE
Ketones, UA: NEGATIVE
Leukocytes,UA: NEGATIVE
Nitrite, UA: NEGATIVE
Protein,UA: NEGATIVE
RBC, UA: NEGATIVE
Specific Gravity, UA: 1.02 (ref 1.005–1.030)
Urobilinogen, Ur: 0.2 mg/dL (ref 0.2–1.0)
pH, UA: 5.5 (ref 5.0–7.5)

## 2022-10-01 MED ORDER — ALFUZOSIN HCL ER 10 MG PO TB24: 10.0000 mg | ORAL_TABLET | Freq: Every day | ORAL | 11 refills | Status: DC

## 2022-10-01 NOTE — Patient Instructions (Signed)

## 2022-10-01 NOTE — Progress Notes (Signed)
10/01/2022 2:14 PM   Craig Robinson [birth year] [id number]  Referring provider: Kirstie Peri, MD [address] [place],  [place] [address]  Followup BPh and nocturia   HPI: Mr Craig Robinson is a 71yo here for followup for BPh with Nocturia. Nocturia decreased to 3x from 8-10x. IPSS 15 QOL 2 on uroxatral. Urine stream mostly strong. No straining to urinate.    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   Asthma    Hypertension     Surgical History: Past Surgical History:  Procedure Laterality Date   ICD IMPLANT N/A 02/05/2018   Procedure: ICD IMPLANT;  Surgeon: Marinus Maw, MD;  Location: [facility] INVASIVE CV LAB;  Service: Cardiovascular;  Laterality: N/A;   LEFT HEART CATH AND CORONARY ANGIOGRAPHY N/A 07/01/2017   Procedure: LEFT HEART CATH AND CORONARY ANGIOGRAPHY;  Surgeon: Corky Crafts, MD;  Location: [facility] INVASIVE CV LAB;  Service: Cardiovascular;  Laterality: N/A;    Home Medications:  Allergies as of 10/01/2022   No Known Allergies      Medication List        Accurate as of October 01, 2022  2:14 PM. If you have any questions, ask your nurse or doctor.          alfuzosin 10 MG 24 hr tablet Commonly known as: UROXATRAL Take 1 tablet (10 mg total) by mouth at bedtime.   amiodarone 200 MG tablet Commonly known as: PACERONE TAKE 1 TABLET EVERY DAY   apixaban 5 MG Tabs tablet Commonly known as: Eliquis Take 1 tablet (5 mg total) by mouth 2 (two) times daily.   atorvastatin 40 MG tablet Commonly known as: LIPITOR TAKE 1 TABLET EVERY DAY   carvedilol 25 MG tablet Commonly known as: COREG TAKE 1/2 TABLET IN THE MORNING  AND TAKE 1 TABLET IN THE EVENING   dapagliflozin propanediol 10 MG Tabs tablet Commonly known as: Farxiga Take 1 tablet (10 mg total) by mouth daily before breakfast.   Entresto 97-103 MG Generic drug: sacubitril-valsartan Take 1 tablet by mouth 2 (two) times daily.    furosemide 40 MG tablet Commonly known as: LASIX Take 40 mg by mouth daily as needed.   meloxicam 15 MG tablet Commonly known as: MOBIC Take 1 tablet by mouth daily.   MISC NATURAL PRODUCTS PO Take 1 tablet by mouth daily. Prostagenics   OVER THE COUNTER MEDICATION Take 2 tablets by mouth at bedtime. Pain Relief Tablets   sildenafil 100 MG tablet Commonly known as: VIAGRA Take 100 mg by mouth daily as needed.   spironolactone 25 MG tablet Commonly known as: ALDACTONE TAKE 1 TABLET EVERY DAY   tamsulosin 0.4 MG Caps capsule Commonly known as: FLOMAX Take 0.4 mg by mouth 2 (two) times daily.        Allergies: No Known Allergies  Family History: Family History  Problem Relation Age of Onset   Alzheimer's disease Mother    Cancer Father     Social History:  reports that he has never smoked. He has never used smokeless tobacco. He reports that he does not drink alcohol and does not use drugs.  ROS: All other review of systems were reviewed and are negative except what is noted above in HPI  Physical Exam: BP 118/76   Pulse (!) 102   Constitutional:  Alert and oriented, No acute distress. HEENT: [place] AT, moist mucus membranes.  Trachea midline, no masses. Cardiovascular: No clubbing, cyanosis, or edema. Respiratory: Normal  respiratory effort, no increased work of breathing. GI: Abdomen is soft, nontender, nondistended, no abdominal masses GU: No CVA tenderness.  Lymph: No cervical or inguinal lymphadenopathy. Skin: No rashes, bruises or suspicious lesions. Neurologic: Grossly intact, no focal deficits, moving all 4 extremities. Psychiatric: Normal mood and affect.  Laboratory Data: Lab Results  Component Value Date   WBC 4.6 07/26/2021   HGB 12.8 (L) 07/26/2021   HCT 39.3 07/26/2021   MCV 105.4 (H) 07/26/2021   PLT 192 07/26/2021    Lab Results  Component Value Date   CREATININE 1.34 (H) 08/24/2021    No results found for: "PSA"  No results found  for: "TESTOSTERONE"  Lab Results  Component Value Date   HGBA1C 5.4 07/01/2017    Urinalysis    Component Value Date/Time   APPEARANCEUR Clear 08/28/2022 0831   GLUCOSEU 3+ (A) 08/28/2022 0831   BILIRUBINUR Negative 08/28/2022 0831   PROTEINUR Negative 08/28/2022 0831   NITRITE Negative 08/28/2022 0831   LEUKOCYTESUR Negative 08/28/2022 0831    Lab Results  Component Value Date   LABMICR See below: 08/28/2022   WBCUA 0-5 08/28/2022   LABEPIT 0-10 08/28/2022   BACTERIA Few (A) 08/28/2022    Pertinent Imaging:  No results found for this or any previous visit.  No results found for this or any previous visit.  No results found for this or any previous visit.  No results found for this or any previous visit.  No results found for this or any previous visit.  No valid procedures specified. No results found for this or any previous visit.  No results found for this or any previous visit.   Assessment & Plan:    1. Benign prostatic hyperplasia, unspecified whether lower urinary tract symptoms present Continue uroxatral 10mg  qhs - Urinalysis, Routine w reflex microscopic  2. Nocturia Continue uroxatral 10mg  qhs   No follow-ups on file.  , MD  [facility] [place]

## 2022-10-07 DIAGNOSIS — M25562 Pain in left knee: Secondary | ICD-10-CM | POA: Diagnosis not present

## 2022-10-07 DIAGNOSIS — Z299 Encounter for prophylactic measures, unspecified: Secondary | ICD-10-CM | POA: Diagnosis not present

## 2022-10-07 DIAGNOSIS — I10 Essential (primary) hypertension: Secondary | ICD-10-CM | POA: Diagnosis not present

## 2022-10-14 ENCOUNTER — Other Ambulatory Visit: Payer: Self-pay | Admitting: Cardiology

## 2022-10-18 ENCOUNTER — Telehealth: Payer: Self-pay | Admitting: Pharmacy Technician

## 2022-10-18 DIAGNOSIS — I10 Essential (primary) hypertension: Secondary | ICD-10-CM | POA: Diagnosis not present

## 2022-10-18 DIAGNOSIS — R5383 Other fatigue: Secondary | ICD-10-CM | POA: Diagnosis not present

## 2022-10-18 DIAGNOSIS — Z0000 Encounter for general adult medical examination without abnormal findings: Secondary | ICD-10-CM | POA: Diagnosis not present

## 2022-10-18 DIAGNOSIS — E7800 Pure hypercholesterolemia, unspecified: Secondary | ICD-10-CM | POA: Diagnosis not present

## 2022-10-18 DIAGNOSIS — Z596 Low income: Secondary | ICD-10-CM

## 2022-10-18 DIAGNOSIS — Z1331 Encounter for screening for depression: Secondary | ICD-10-CM | POA: Diagnosis not present

## 2022-10-18 DIAGNOSIS — Z125 Encounter for screening for malignant neoplasm of prostate: Secondary | ICD-10-CM | POA: Diagnosis not present

## 2022-10-18 DIAGNOSIS — I5022 Chronic systolic (congestive) heart failure: Secondary | ICD-10-CM | POA: Diagnosis not present

## 2022-10-18 DIAGNOSIS — Z1339 Encounter for screening examination for other mental health and behavioral disorders: Secondary | ICD-10-CM | POA: Diagnosis not present

## 2022-10-18 DIAGNOSIS — Z6839 Body mass index (BMI) 39.0-39.9, adult: Secondary | ICD-10-CM | POA: Diagnosis not present

## 2022-10-18 DIAGNOSIS — Z7189 Other specified counseling: Secondary | ICD-10-CM | POA: Diagnosis not present

## 2022-10-18 DIAGNOSIS — Z79899 Other long term (current) drug therapy: Secondary | ICD-10-CM | POA: Diagnosis not present

## 2022-10-18 DIAGNOSIS — Z299 Encounter for prophylactic measures, unspecified: Secondary | ICD-10-CM | POA: Diagnosis not present

## 2022-10-18 NOTE — Progress Notes (Signed)
[facility])                                            [facility] Quality Pharmacy Team    10/18/2022  Craig Robinson 04/07/1951 [id number]  Received both patient and provider portion(s) of patient assistance application(s) for [place]. Faxed completed application and required documents into [employer] and [employer].    Craig Robinson, CPhT [facility]  [phone]

## 2022-10-25 DIAGNOSIS — I10 Essential (primary) hypertension: Secondary | ICD-10-CM | POA: Diagnosis not present

## 2022-10-25 DIAGNOSIS — Z0000 Encounter for general adult medical examination without abnormal findings: Secondary | ICD-10-CM | POA: Diagnosis not present

## 2022-10-25 DIAGNOSIS — Z299 Encounter for prophylactic measures, unspecified: Secondary | ICD-10-CM | POA: Diagnosis not present

## 2022-10-25 DIAGNOSIS — Z6839 Body mass index (BMI) 39.0-39.9, adult: Secondary | ICD-10-CM | POA: Diagnosis not present

## 2022-10-25 DIAGNOSIS — I4891 Unspecified atrial fibrillation: Secondary | ICD-10-CM | POA: Diagnosis not present

## 2022-10-31 ENCOUNTER — Telehealth: Payer: Self-pay | Admitting: *Deleted

## 2022-10-31 NOTE — Telephone Encounter (Signed)
Received fax from [employer] Patient Assistance - Delene Loll approved until 10/28/2023 at no cost to patient.

## 2022-11-05 ENCOUNTER — Ambulatory Visit (INDEPENDENT_AMBULATORY_CARE_PROVIDER_SITE_OTHER): Payer: Medicare HMO

## 2022-11-05 DIAGNOSIS — I428 Other cardiomyopathies: Secondary | ICD-10-CM | POA: Diagnosis not present

## 2022-11-05 LAB — CUP PACEART REMOTE DEVICE CHECK
Battery Remaining Longevity: 46 mo
Battery Voltage: 2.97 V
Brady Statistic AP VP Percent: 0.06 %
Brady Statistic AP VS Percent: 96.72 %
Brady Statistic AS VP Percent: 0 %
Brady Statistic AS VS Percent: 3.22 %
Brady Statistic RA Percent Paced: 95.34 %
Brady Statistic RV Percent Paced: 0.06 %
Date Time Interrogation Session: 20240109044226
HighPow Impedance: 72 Ohm
Implantable Lead Connection Status: 753985
Implantable Lead Connection Status: 753985
Implantable Lead Implant Date: 20190411
Implantable Lead Implant Date: 20190411
Implantable Lead Location: 753859
Implantable Lead Location: 753860
Implantable Lead Model: 5076
Implantable Lead Model: 6935
Implantable Pulse Generator Implant Date: 20190411
Lead Channel Impedance Value: 399 Ohm
Lead Channel Impedance Value: 456 Ohm
Lead Channel Impedance Value: 475 Ohm
Lead Channel Pacing Threshold Amplitude: 0.5 V
Lead Channel Pacing Threshold Amplitude: 0.75 V
Lead Channel Pacing Threshold Pulse Width: 0.4 ms
Lead Channel Pacing Threshold Pulse Width: 0.4 ms
Lead Channel Sensing Intrinsic Amplitude: 3 mV
Lead Channel Sensing Intrinsic Amplitude: 3 mV
Lead Channel Sensing Intrinsic Amplitude: 6.5 mV
Lead Channel Sensing Intrinsic Amplitude: 6.5 mV
Lead Channel Setting Pacing Amplitude: 2 V
Lead Channel Setting Pacing Amplitude: 2.5 V
Lead Channel Setting Pacing Pulse Width: 0.4 ms
Lead Channel Setting Sensing Sensitivity: 0.3 mV
Zone Setting Status: 755011
Zone Setting Status: 755011

## 2022-11-12 ENCOUNTER — Telehealth: Payer: Self-pay | Admitting: Pharmacy Technician

## 2022-11-12 DIAGNOSIS — Z596 Low income: Secondary | ICD-10-CM

## 2022-11-12 NOTE — Progress Notes (Signed)
Craig Robinson)                                            [place] Team    11/12/2022  Craig Robinson 03-17-1951 [id number]  2 care coordination calls placed to [employer] in regard to [place] and [employer] in regard to [place] application.  Spoke to Craig Robinson at [place] who informs patient is APPROVED 10/28/22-10/28/23 for [place]. He informs medication will auto fill and ship to patient's home based on last fill date in 2023.  Spoke to Craig Robinson at [employer] who informs patient is APPROVED 10/28/22-10/28/23 for [employer]  She informs patient will need to [employer] as he needs refills allowing 14 business days for processing and shipping. The medication will then be delivered to the patient's home.  Craig Robinson, [place]  [phone]

## 2022-11-29 DIAGNOSIS — I4891 Unspecified atrial fibrillation: Secondary | ICD-10-CM | POA: Diagnosis not present

## 2022-11-29 DIAGNOSIS — Z299 Encounter for prophylactic measures, unspecified: Secondary | ICD-10-CM | POA: Diagnosis not present

## 2022-11-29 DIAGNOSIS — I10 Essential (primary) hypertension: Secondary | ICD-10-CM | POA: Diagnosis not present

## 2022-11-29 DIAGNOSIS — M450 Ankylosing spondylitis of multiple sites in spine: Secondary | ICD-10-CM | POA: Diagnosis not present

## 2022-11-29 DIAGNOSIS — M25551 Pain in right hip: Secondary | ICD-10-CM | POA: Diagnosis not present

## 2022-11-29 DIAGNOSIS — Z6839 Body mass index (BMI) 39.0-39.9, adult: Secondary | ICD-10-CM | POA: Diagnosis not present

## 2022-11-29 NOTE — Progress Notes (Signed)
Remote ICD transmission.   

## 2023-01-01 ENCOUNTER — Ambulatory Visit: Payer: Medicare HMO | Admitting: Urology

## 2023-01-01 ENCOUNTER — Encounter: Payer: Self-pay | Admitting: Urology

## 2023-01-01 VITALS — BP 101/65 | HR 91

## 2023-01-01 DIAGNOSIS — R351 Nocturia: Secondary | ICD-10-CM | POA: Diagnosis not present

## 2023-01-01 DIAGNOSIS — N400 Enlarged prostate without lower urinary tract symptoms: Secondary | ICD-10-CM

## 2023-01-01 LAB — URINALYSIS, ROUTINE W REFLEX MICROSCOPIC
Bilirubin, UA: NEGATIVE
Ketones, UA: NEGATIVE
Leukocytes,UA: NEGATIVE
Nitrite, UA: NEGATIVE
Protein,UA: NEGATIVE
RBC, UA: NEGATIVE
Specific Gravity, UA: 1.02 (ref 1.005–1.030)
Urobilinogen, Ur: 0.2 mg/dL (ref 0.2–1.0)
pH, UA: 5.5 (ref 5.0–7.5)

## 2023-01-01 MED ORDER — MIRABEGRON ER 25 MG PO TB24: 25.0000 mg | ORAL_TABLET | Freq: Every day | ORAL | 0 refills | Status: DC

## 2023-01-01 NOTE — Patient Instructions (Signed)

## 2023-01-01 NOTE — Progress Notes (Unsigned)
01/01/2023 9:23 AM   Craig Robinson Apr 03, 1951 [id number]  Referring provider: Monico Blitz, MD [address] [place],  [place] [address]  Followup BPh with nocturia   HPI: Mr Buttrey is a 72yo here for followup for BPH with nocturia. IPSS 15 QOL 3 on uroxatral '10mg'$  qhs. Urine stream strong. Nocturia increased to 4x. He occasionally has small volume incontinence on the way to the bathroom. Urine stream good. No straining to urinate. He drinks 20oz of water within 2 hours of going to bed.    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   Asthma    Hypertension     Surgical History: Past Surgical History:  Procedure Laterality Date   ICD IMPLANT N/A 02/05/2018   Procedure: ICD IMPLANT;  Surgeon: Evans Lance, MD;  Location: [place] CV LAB;  Service: Cardiovascular;  Laterality: N/A;   LEFT HEART CATH AND CORONARY ANGIOGRAPHY N/A 07/01/2017   Procedure: LEFT HEART CATH AND CORONARY ANGIOGRAPHY;  Surgeon: Jettie Booze, MD;  Location: [place] CV LAB;  Service: Cardiovascular;  Laterality: N/A;    Home Medications:  Allergies as of 01/01/2023   No Known Allergies      Medication List        Accurate as of January 01, 2023  9:23 AM. If you have any questions, ask your nurse or doctor.          alfuzosin 10 MG 24 hr tablet Commonly known as: UROXATRAL Take 1 tablet (10 mg total) by mouth at bedtime.   amiodarone 200 MG tablet Commonly known as: PACERONE TAKE 1 TABLET EVERY DAY   apixaban 5 MG Tabs tablet Commonly known as: Eliquis Take 1 tablet (5 mg total) by mouth 2 (two) times daily.   atorvastatin 40 MG tablet Commonly known as: LIPITOR TAKE 1 TABLET EVERY DAY   carvedilol 25 MG tablet Commonly known as: COREG TAKE 1/2 TABLET IN THE MORNING  AND TAKE 1 TABLET IN THE EVENING   dapagliflozin propanediol 10 MG Tabs tablet Commonly known as: Farxiga Take 1 tablet (10 mg total) by  mouth daily before breakfast.   Entresto 97-103 MG Generic drug: sacubitril-valsartan Take 1 tablet by mouth 2 (two) times daily.   furosemide 40 MG tablet Commonly known as: LASIX Take 40 mg by mouth daily as needed.   meloxicam 15 MG tablet Commonly known as: MOBIC Take 1 tablet by mouth daily.   MISC NATURAL PRODUCTS PO Take 1 tablet by mouth daily. Prostagenics   OVER THE COUNTER MEDICATION Take 2 tablets by mouth at bedtime. Pain Relief Tablets   sildenafil 100 MG tablet Commonly known as: VIAGRA Take 100 mg by mouth daily as needed.   spironolactone 25 MG tablet Commonly known as: ALDACTONE TAKE 1 TABLET EVERY DAY   tamsulosin 0.4 MG Caps capsule Commonly known as: FLOMAX Take 0.4 mg by mouth 2 (two) times daily.        Allergies: No Known Allergies  Family History: Family History  Problem Relation Age of Onset   Alzheimer's disease Mother    Cancer Father     Social History:  reports that he has never smoked. He has never used smokeless tobacco. He reports that he does not drink alcohol and does not use drugs.  ROS: All other review of systems were reviewed and are negative except what is noted above in HPI  Physical Exam: BP 101/65   Pulse 91   Constitutional:  Alert and oriented, No acute distress. HEENT: [place] AT, moist mucus membranes.  Trachea midline, no masses. Cardiovascular: No clubbing, cyanosis, or edema. Respiratory: Normal respiratory effort, no increased work of breathing. GI: Abdomen is soft, nontender, nondistended, no abdominal masses GU: No CVA tenderness.  Lymph: No cervical or inguinal lymphadenopathy. Skin: No rashes, bruises or suspicious lesions. Neurologic: Grossly intact, no focal deficits, moving all 4 extremities. Psychiatric: Normal mood and affect.  Laboratory Data: Lab Results  Component Value Date   WBC 4.6 07/26/2021   HGB 12.8 (L) 07/26/2021   HCT 39.3 07/26/2021   MCV 105.4 (H) 07/26/2021   PLT 192 07/26/2021     Lab Results  Component Value Date   CREATININE 1.34 (H) 08/24/2021    No results found for: "PSA"  No results found for: "TESTOSTERONE"  Lab Results  Component Value Date   HGBA1C 5.4 07/01/2017    Urinalysis    Component Value Date/Time   APPEARANCEUR Clear 10/01/2022 1405   GLUCOSEU 3+ (A) 10/01/2022 1405   BILIRUBINUR Negative 10/01/2022 1405   PROTEINUR Negative 10/01/2022 1405   NITRITE Negative 10/01/2022 1405   LEUKOCYTESUR Negative 10/01/2022 1405    Lab Results  Component Value Date   LABMICR Comment 10/01/2022   WBCUA 0-5 08/28/2022   LABEPIT 0-10 08/28/2022   BACTERIA Few (A) 08/28/2022    Pertinent Imaging: *** No results found for this or any previous visit.  No results found for this or any previous visit.  No results found for this or any previous visit.  No results found for this or any previous visit.  No results found for this or any previous visit.  No valid procedures specified. No results found for this or any previous visit.  No results found for this or any previous visit.   Assessment & Plan:    1. Benign prostatic hyperplasia, unspecified whether lower urinary tract symptoms present *** - Urinalysis, Routine w reflex microscopic  2. Nocturia ***   No follow-ups on file.  Nicolette Bang, MD  [facility] [place]

## 2023-01-02 MED ORDER — ALFUZOSIN HCL ER 10 MG PO TB24: 10.0000 mg | ORAL_TABLET | Freq: Every day | ORAL | 11 refills | Status: DC

## 2023-01-10 ENCOUNTER — Ambulatory Visit: Payer: Medicare HMO | Admitting: Cardiology

## 2023-01-15 ENCOUNTER — Ambulatory Visit: Payer: Medicare HMO | Attending: Cardiology | Admitting: Cardiology

## 2023-01-15 ENCOUNTER — Encounter: Payer: Self-pay | Admitting: Cardiology

## 2023-01-15 VITALS — BP 126/80 | HR 73 | Ht 73.0 in | Wt 285.2 lb

## 2023-01-15 DIAGNOSIS — I4729 Other ventricular tachycardia: Secondary | ICD-10-CM | POA: Diagnosis not present

## 2023-01-15 DIAGNOSIS — I4891 Unspecified atrial fibrillation: Secondary | ICD-10-CM

## 2023-01-15 DIAGNOSIS — I5022 Chronic systolic (congestive) heart failure: Secondary | ICD-10-CM

## 2023-01-15 DIAGNOSIS — D6869 Other thrombophilia: Secondary | ICD-10-CM | POA: Diagnosis not present

## 2023-01-15 NOTE — Progress Notes (Signed)
Clinical Summary Mr. Benzie is a 72 y.o.male seen for follow up of the following medical problems.      1. Chronic systolic heart failure - 123XX123 echo LVEF 25-30%, diffuse hypokinesis, grade I diastolic dysfunction - 123XX123 cath without obstructive CAD - 11/2017 LVEF 20-25% - AICD followed by Dr Lovena Le   01/2021 echo LVEF 30-35%.       - no SOB/DOE. No recent edema - compliant with meds - home weights 283-285 lbs range     2. 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 09/2022 normal LFTs and TSH - no recent palpitaitons      3. PAF  - no bleeding on eliquis - no palpitations. -  4. Hyperlipidemia   09/2022 TC 102 TG 35 HDL 54 LDL 38   5.HTN - compliant with meds       Past Medical History:  Diagnosis Date   AICD (automatic cardioverter/defibrillator) present 02/05/2018   Asthma    Hypertension      No Known Allergies   Current Outpatient Medications  Medication Sig Dispense Refill   alfuzosin (UROXATRAL) 10 MG 24 hr tablet Take 1 tablet (10 mg total) by mouth at bedtime. 30 tablet 11   amiodarone (PACERONE) 200 MG tablet TAKE 1 TABLET EVERY DAY 90 tablet 1   apixaban (ELIQUIS) 5 MG TABS tablet Take 1 tablet (5 mg total) by mouth 2 (two) times daily. 180 tablet 3   atorvastatin (LIPITOR) 40 MG tablet TAKE 1 TABLET EVERY DAY 90 tablet 1   carvedilol (COREG) 25 MG tablet TAKE 1/2 TABLET IN THE MORNING  AND TAKE 1 TABLET IN THE EVENING 135 tablet 1   dapagliflozin propanediol (FARXIGA) 10 MG TABS tablet Take 1 tablet (10 mg total) by mouth daily before breakfast. 90 tablet 3   furosemide (LASIX) 40 MG tablet Take 40 mg by mouth daily as needed.     meloxicam (MOBIC) 15 MG tablet Take 1 tablet by mouth daily.     mirabegron ER (MYRBETRIQ) 25 MG TB24 tablet Take 1 tablet (25 mg total) by mouth daily. 30 tablet 0   MISC NATURAL PRODUCTS PO Take 1 tablet by mouth daily. Prostagenics     OVER THE COUNTER MEDICATION  Take 2 tablets by mouth at bedtime. Pain Relief Tablets     sacubitril-valsartan (ENTRESTO) 97-103 MG Take 1 tablet by mouth 2 (two) times daily. 180 tablet 3   sildenafil (VIAGRA) 100 MG tablet Take 100 mg by mouth daily as needed.     spironolactone (ALDACTONE) 25 MG tablet TAKE 1 TABLET EVERY DAY 90 tablet 3   tamsulosin (FLOMAX) 0.4 MG CAPS capsule Take 0.4 mg by mouth 2 (two) times daily.     No current facility-administered medications for this visit.     Past Surgical History:  Procedure Laterality Date   ICD IMPLANT N/A 02/05/2018   Procedure: ICD IMPLANT;  Surgeon: Evans Lance, MD;  Location: [facility];  Service: Cardiovascular;  Laterality: N/A;   LEFT HEART CATH AND CORONARY ANGIOGRAPHY N/A 07/01/2017   Procedure: LEFT HEART CATH AND CORONARY ANGIOGRAPHY;  Surgeon: Jettie Booze, MD;  Location: [facility];  Service: Cardiovascular;  Laterality: N/A;     No Known Allergies    Family History  Problem Relation Age of Onset   Alzheimer's disease Mother    Cancer Father      Social History Mr. Zahra reports  that he has never smoked. He has never used smokeless tobacco. Mr. Dundee reports no history of alcohol use.   Review of Systems CONSTITUTIONAL: No weight loss, fever, chills, weakness or fatigue.  HEENT: Eyes: No visual loss, blurred vision, double vision or yellow sclerae.No hearing loss, sneezing, congestion, runny nose or sore throat.  SKIN: No rash or itching.  CARDIOVASCULAR: per hpi RESPIRATORY: No shortness of breath, cough or sputum.  GASTROINTESTINAL: No anorexia, nausea, vomiting or diarrhea. No abdominal pain or blood.  GENITOURINARY: No burning on urination, no polyuria NEUROLOGICAL: No headache, dizziness, syncope, paralysis, ataxia, numbness or tingling in the extremities. No change in bowel or bladder control.  MUSCULOSKELETAL: No muscle, back pain, joint pain or stiffness.  LYMPHATICS: No enlarged nodes. No history  of splenectomy.  PSYCHIATRIC: No history of depression or anxiety.  ENDOCRINOLOGIC: No reports of sweating, cold or heat intolerance. No polyuria or polydipsia.  Marland Kitchen   Physical Examination Today's Vitals   01/15/23 0824  BP: 126/80  Pulse: 73  SpO2: 97%  Weight: 285 lb 3.2 oz (129.4 kg)  Height: 6\' 1"  (1.854 m)   Body mass index is 37.63 kg/m.  Gen: resting comfortably, no acute distress HEENT: no scleral icterus, pupils equal round and reactive, no palptable cervical adenopathy,  CV: RRR, no m/r/g no jvd Resp: Clear to auscultation bilaterally GI: abdomen is soft, non-tender, non-distended, normal bowel sounds, no hepatosplenomegaly MSK: extremities are warm, no edema.  Skin: warm, no rash Neuro:  no focal deficits Psych: appropriate affect   Diagnostic Studies 01/2021 echo 1. Left ventricular ejection fraction, by estimation, is 30 to 35%. The  left ventricle has moderately decreased function. The left ventricle  demonstrates global hypokinesis. The left ventricular internal cavity size  was mildly dilated. Left ventricular  diastolic parameters are consistent with Grade I diastolic dysfunction  (impaired relaxation). The average left ventricular global longitudinal  strain is 10.2 %. The global longitudinal strain is abnormal.   2. Right ventricular systolic function was not well visualized. The right  ventricular size is not well visualized.   3. The mitral valve was not well visualized. No evidence of mitral valve  regurgitation. No evidence of mitral stenosis.   4. The aortic valve has an indeterminant number of cusps. There is mild  calcification of the aortic valve. There is mild thickening of the aortic  valve. Aortic valve regurgitation is not visualized. No aortic stenosis is  present.   5. The inferior vena cava is normal in size with greater than 50%  respiratory variability, suggesting right atrial pressure of 3 mmHg.       Assessment and Plan  1.  Chronic systolic HF - no symptoms, cotinue current meds   2. NSVT/VT - has ICD in place, started on amiodarone by EP - no recent symptoms, continue current meds     3. Afib/acquired thrombophilia - doing well without symptoms,continue current meds including eliquis for stroke prevention.    4. Hyperlipidemia -at goal, continue current meds   F/u 6 months   Arnoldo Lenis, M.D.

## 2023-01-15 NOTE — Patient Instructions (Signed)
Medication Instructions:  Continue all current medications.   Labwork: none  Testing/Procedures: none  Follow-Up: 6 months   Any Other Special Instructions Will Be Listed Below (If Applicable).   If you need a refill on your cardiac medications before your next appointment, please call your pharmacy.  

## 2023-01-27 DIAGNOSIS — I10 Essential (primary) hypertension: Secondary | ICD-10-CM | POA: Diagnosis not present

## 2023-01-27 DIAGNOSIS — Z299 Encounter for prophylactic measures, unspecified: Secondary | ICD-10-CM | POA: Diagnosis not present

## 2023-01-27 DIAGNOSIS — I4891 Unspecified atrial fibrillation: Secondary | ICD-10-CM | POA: Diagnosis not present

## 2023-01-27 DIAGNOSIS — M25551 Pain in right hip: Secondary | ICD-10-CM | POA: Diagnosis not present

## 2023-01-27 DIAGNOSIS — M450 Ankylosing spondylitis of multiple sites in spine: Secondary | ICD-10-CM | POA: Diagnosis not present

## 2023-01-27 DIAGNOSIS — Z6841 Body Mass Index (BMI) 40.0 and over, adult: Secondary | ICD-10-CM | POA: Diagnosis not present

## 2023-01-27 DIAGNOSIS — I5022 Chronic systolic (congestive) heart failure: Secondary | ICD-10-CM | POA: Diagnosis not present

## 2023-02-04 ENCOUNTER — Ambulatory Visit (INDEPENDENT_AMBULATORY_CARE_PROVIDER_SITE_OTHER): Payer: Medicare HMO

## 2023-02-04 DIAGNOSIS — I428 Other cardiomyopathies: Secondary | ICD-10-CM | POA: Diagnosis not present

## 2023-02-04 LAB — CUP PACEART REMOTE DEVICE CHECK
Battery Remaining Longevity: 41 mo
Battery Voltage: 2.97 V
Brady Statistic AP VP Percent: 0.06 %
Brady Statistic AP VS Percent: 96.38 %
Brady Statistic AS VP Percent: 0 %
Brady Statistic AS VS Percent: 3.56 %
Brady Statistic RA Percent Paced: 95.27 %
Brady Statistic RV Percent Paced: 0.07 %
Date Time Interrogation Session: 20240409012304
HighPow Impedance: 70 Ohm
Implantable Lead Connection Status: 753985
Implantable Lead Connection Status: 753985
Implantable Lead Implant Date: 20190411
Implantable Lead Implant Date: 20190411
Implantable Lead Location: 753859
Implantable Lead Location: 753860
Implantable Lead Model: 5076
Implantable Lead Model: 6935
Implantable Pulse Generator Implant Date: 20190411
Lead Channel Impedance Value: 399 Ohm
Lead Channel Impedance Value: 399 Ohm
Lead Channel Impedance Value: 513 Ohm
Lead Channel Pacing Threshold Amplitude: 0.625 V
Lead Channel Pacing Threshold Amplitude: 0.625 V
Lead Channel Pacing Threshold Pulse Width: 0.4 ms
Lead Channel Pacing Threshold Pulse Width: 0.4 ms
Lead Channel Sensing Intrinsic Amplitude: 2.625 mV
Lead Channel Sensing Intrinsic Amplitude: 2.625 mV
Lead Channel Sensing Intrinsic Amplitude: 6.25 mV
Lead Channel Sensing Intrinsic Amplitude: 6.25 mV
Lead Channel Setting Pacing Amplitude: 2 V
Lead Channel Setting Pacing Amplitude: 2.5 V
Lead Channel Setting Pacing Pulse Width: 0.4 ms
Lead Channel Setting Sensing Sensitivity: 0.3 mV
Zone Setting Status: 755011
Zone Setting Status: 755011

## 2023-02-18 DIAGNOSIS — Z299 Encounter for prophylactic measures, unspecified: Secondary | ICD-10-CM | POA: Diagnosis not present

## 2023-02-18 DIAGNOSIS — M25562 Pain in left knee: Secondary | ICD-10-CM | POA: Diagnosis not present

## 2023-02-18 DIAGNOSIS — I10 Essential (primary) hypertension: Secondary | ICD-10-CM | POA: Diagnosis not present

## 2023-02-19 ENCOUNTER — Ambulatory Visit: Payer: Medicare HMO | Admitting: Urology

## 2023-02-19 VITALS — BP 101/64 | HR 84

## 2023-02-19 DIAGNOSIS — N400 Enlarged prostate without lower urinary tract symptoms: Secondary | ICD-10-CM

## 2023-02-19 DIAGNOSIS — N5201 Erectile dysfunction due to arterial insufficiency: Secondary | ICD-10-CM

## 2023-02-19 DIAGNOSIS — R351 Nocturia: Secondary | ICD-10-CM

## 2023-02-19 LAB — URINALYSIS, ROUTINE W REFLEX MICROSCOPIC
Bilirubin, UA: NEGATIVE
Ketones, UA: NEGATIVE
Leukocytes,UA: NEGATIVE
Nitrite, UA: NEGATIVE
Protein,UA: NEGATIVE
RBC, UA: NEGATIVE
Specific Gravity, UA: 1.02 (ref 1.005–1.030)
Urobilinogen, Ur: 0.2 mg/dL (ref 0.2–1.0)
pH, UA: 5 (ref 5.0–7.5)

## 2023-02-19 MED ORDER — ALFUZOSIN HCL ER 10 MG PO TB24: 10.0000 mg | ORAL_TABLET | Freq: Every day | ORAL | 11 refills | Status: DC

## 2023-02-19 MED ORDER — SILDENAFIL CITRATE 100 MG PO TABS: 100.0000 mg | ORAL_TABLET | ORAL | 5 refills | Status: DC | PRN

## 2023-02-19 MED ORDER — GEMTESA 75 MG PO TABS: 1.0000 | ORAL_TABLET | Freq: Every day | ORAL | 0 refills | Status: DC

## 2023-02-19 NOTE — Progress Notes (Signed)
02/19/2023 10:26 AM   Craig Robinson [birth year] [id number]  Referring provider: Kirstie Peri, MD [address] [place],  [place] [address]  Followup BPH and nocturia   HPI:  Craig Robinson is a 72yo here for followup for BPH with nocturia. IPSS 23 QOL 5 on uroxatral  daily. Last visit we tried mirabegron which failed to improve his LUTS. He continues to have nocturia 4x. He uses 2-3 pads per day due to urge incontinence. Urine stream strong. No straining to urinate.   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   Asthma    Hypertension     Surgical History: Past Surgical History:  Procedure Laterality Date   ICD IMPLANT N/A 02/05/2018   Procedure: ICD IMPLANT;  Surgeon: Marinus Maw, MD;  Location: [facility] INVASIVE CV LAB;  Service: Cardiovascular;  Laterality: N/A;   LEFT HEART CATH AND CORONARY ANGIOGRAPHY N/A 07/01/2017   Procedure: LEFT HEART CATH AND CORONARY ANGIOGRAPHY;  Surgeon: Corky Crafts, MD;  Location: [facility] INVASIVE CV LAB;  Service: Cardiovascular;  Laterality: N/A;    Home Medications:  Allergies as of 02/19/2023   No Known Allergies      Medication List        Accurate as of February 19, 2023 10:26 AM. If you have any questions, ask your nurse or doctor.          alfuzosin 10 MG 24 hr tablet Commonly known as: UROXATRAL Take 1 tablet (10 mg total) by mouth at bedtime.   amiodarone 200 MG tablet Commonly known as: PACERONE TAKE 1 TABLET EVERY DAY   apixaban 5 MG Tabs tablet Commonly known as: Eliquis Take 1 tablet (5 mg total) by mouth 2 (two) times daily.   atorvastatin 40 MG tablet Commonly known as: LIPITOR TAKE 1 TABLET EVERY DAY   carvedilol 25 MG tablet Commonly known as: COREG TAKE 1/2 TABLET IN THE MORNING  AND TAKE 1 TABLET IN THE EVENING   dapagliflozin propanediol 10 MG Tabs tablet Commonly known as: Farxiga Take 1 tablet (10 mg total) by mouth daily before  breakfast.   Entresto 97-103 MG Generic drug: sacubitril-valsartan Take 1 tablet by mouth 2 (two) times daily.   furosemide 40 MG tablet Commonly known as: LASIX Take 40 mg by mouth daily as needed.   meloxicam 15 MG tablet Commonly known as: MOBIC Take 1 tablet by mouth daily.   mirabegron ER 25 MG Tb24 tablet Commonly known as: MYRBETRIQ Take 1 tablet (25 mg total) by mouth daily.   MISC NATURAL PRODUCTS PO Take 1 tablet by mouth daily. Prostagenics   OVER THE COUNTER MEDICATION Take 2 tablets by mouth at bedtime. Pain Relief Tablets   sildenafil 100 MG tablet Commonly known as: VIAGRA Take 100 mg by mouth daily as needed.   spironolactone 25 MG tablet Commonly known as: ALDACTONE TAKE 1 TABLET EVERY DAY   tamsulosin 0.4 MG Caps capsule Commonly known as: FLOMAX Take 0.4 mg by mouth 2 (two) times daily.        Allergies: No Known Allergies  Family History: Family History  Problem Relation Age of Onset   Alzheimer's disease Mother    Cancer Father     Social History:  reports that he has never smoked. He has never used smokeless tobacco. He reports that he does not drink alcohol and does not use drugs.  ROS: All other review of systems were reviewed and are negative except what is noted  above in HPI  Physical Exam: BP 101/64   Pulse 84   Constitutional:  Alert and oriented, No acute distress. HEENT: [place] AT, moist mucus membranes.  Trachea midline, no masses. Cardiovascular: No clubbing, cyanosis, or edema. Respiratory: Normal respiratory effort, no increased work of breathing. GI: Abdomen is soft, nontender, nondistended, no abdominal masses GU: No CVA tenderness.  Lymph: No cervical or inguinal lymphadenopathy. Skin: No rashes, bruises or suspicious lesions. Neurologic: Grossly intact, no focal deficits, moving all 4 extremities. Psychiatric: Normal mood and affect.  Laboratory Data: Lab Results  Component Value Date   WBC 4.6 07/26/2021   HGB  12.8 (L) 07/26/2021   HCT 39.3 07/26/2021   MCV 105.4 (H) 07/26/2021   PLT 192 07/26/2021    Lab Results  Component Value Date   CREATININE 1.34 (H) 08/24/2021    No results found for: "PSA"  No results found for: "TESTOSTERONE"  Lab Results  Component Value Date   HGBA1C 5.4 07/01/2017    Urinalysis    Component Value Date/Time   APPEARANCEUR Clear 01/01/2023 0922   GLUCOSEU 3+ (A) 01/01/2023 0922   BILIRUBINUR Negative 01/01/2023 0922   PROTEINUR Negative 01/01/2023 0922   NITRITE Negative 01/01/2023 0922   LEUKOCYTESUR Negative 01/01/2023 0922    Lab Results  Component Value Date   LABMICR Comment 01/01/2023   WBCUA 0-5 08/28/2022   LABEPIT 0-10 08/28/2022   BACTERIA Few (A) 08/28/2022    Pertinent Imaging:  No results found for this or any previous visit.  No results found for this or any previous visit.  No results found for this or any previous visit.  No results found for this or any previous visit.  No results found for this or any previous visit.  No valid procedures specified. No results found for this or any previous visit.  No results found for this or any previous visit.   Assessment & Plan:    1. Benign prostatic hyperplasia, unspecified whether lower urinary tract symptoms present -Continue uroxatral 10mg  qhs - Urinalysis, Routine w reflex microscopic  2. Nocturia -uroxatral 10mg  qhs  3. Erectile dysfunction -sildenafil 100mg  PRN   No follow-ups on file.  Wilkie Aye, MD  [facility]

## 2023-02-25 ENCOUNTER — Encounter: Payer: Self-pay | Admitting: Urology

## 2023-02-25 NOTE — Patient Instructions (Signed)

## 2023-02-28 DIAGNOSIS — M25562 Pain in left knee: Secondary | ICD-10-CM | POA: Diagnosis not present

## 2023-03-05 ENCOUNTER — Encounter: Payer: Medicare HMO | Admitting: Internal Medicine

## 2023-03-09 ENCOUNTER — Other Ambulatory Visit: Payer: Self-pay | Admitting: Cardiology

## 2023-03-14 NOTE — Progress Notes (Signed)
Remote ICD transmission.   

## 2023-03-19 ENCOUNTER — Encounter: Payer: Self-pay | Admitting: Internal Medicine

## 2023-03-19 ENCOUNTER — Ambulatory Visit: Payer: Medicare HMO | Attending: Internal Medicine | Admitting: Internal Medicine

## 2023-03-19 VITALS — BP 102/72 | HR 63 | Ht 73.0 in | Wt 277.0 lb

## 2023-03-19 DIAGNOSIS — I4891 Unspecified atrial fibrillation: Secondary | ICD-10-CM

## 2023-03-19 LAB — CUP PACEART INCLINIC DEVICE CHECK
Date Time Interrogation Session: 20240522105205
Implantable Lead Connection Status: 753985
Implantable Lead Connection Status: 753985
Implantable Lead Implant Date: 20190411
Implantable Lead Implant Date: 20190411
Implantable Lead Location: 753859
Implantable Lead Location: 753860
Implantable Lead Model: 5076
Implantable Lead Model: 6935
Implantable Pulse Generator Implant Date: 20190411

## 2023-03-19 MED ORDER — APIXABAN 5 MG PO TABS: 5.0000 mg | ORAL_TABLET | Freq: Two times a day (BID) | ORAL | 0 refills | Status: DC

## 2023-03-19 MED ORDER — AMIODARONE HCL 200 MG PO TABS
ORAL_TABLET | ORAL | 3 refills | Status: DC
Start: 2023-03-19 — End: 2024-02-19

## 2023-03-19 NOTE — Progress Notes (Signed)
HPI Craig Robinson returns today for followup.  He is a very pleasant 72 year old man with a history of chronic systolic heart failure, ventricular tachycardia, status post ICD insertion, obesity, and hypertension.  In the interim, he has had minimal dyspnea.  He admits to dietary indiscretion with sodium.  He has lost weight since his last visit.  He has no significant peripheral edema.  He denies anginal symptoms.  He denies ICD therapies.   No Known Allergies   Current Outpatient Medications  Medication Sig Dispense Refill   alfuzosin (UROXATRAL) 10 MG 24 hr tablet Take 1 tablet (10 mg total) by mouth at bedtime. 30 tablet 11   amiodarone (PACERONE) 200 MG tablet TAKE 1 TABLET EVERY DAY 90 tablet 3   apixaban (ELIQUIS) 5 MG TABS tablet Take 1 tablet (5 mg total) by mouth 2 (two) times daily. 180 tablet 3   atorvastatin (LIPITOR) 40 MG tablet TAKE 1 TABLET EVERY DAY 90 tablet 3   carvedilol (COREG) 25 MG tablet TAKE 1/2 TABLET IN THE MORNING AND TAKE 1 TABLET IN THE EVENING 135 tablet 3   dapagliflozin propanediol (FARXIGA) 10 MG TABS tablet Take 1 tablet (10 mg total) by mouth daily before breakfast. 90 tablet 3   ENTRESTO 97-103 MG TAKE 1 TABLET TWICE DAILY 180 tablet 3   furosemide (LASIX) 40 MG tablet Take 40 mg by mouth daily as needed.     mirabegron ER (MYRBETRIQ) 25 MG TB24 tablet Take 1 tablet (25 mg total) by mouth daily. 30 tablet 0   MISC NATURAL PRODUCTS PO Take 1 tablet by mouth daily. Prostagenics     OVER THE COUNTER MEDICATION Take 2 tablets by mouth at bedtime. Pain Relief Tablets     sildenafil (VIAGRA) 100 MG tablet Take 1 tablet (100 mg total) by mouth as needed. 10 tablet 5   spironolactone (ALDACTONE) 25 MG tablet TAKE 1 TABLET EVERY DAY 90 tablet 3   tamsulosin (FLOMAX) 0.4 MG CAPS capsule Take 0.4 mg by mouth 2 (two) times daily.     Vibegron (GEMTESA) 75 MG TABS Take 1 tablet (75 mg total) by mouth daily. 30 tablet 0   No current facility-administered  medications for this visit.     Past Medical History:  Diagnosis Date   AICD (automatic cardioverter/defibrillator) present 02/05/2018   Asthma    Hypertension     ROS:   All systems reviewed and negative except as noted in the HPI.   Past Surgical History:  Procedure Laterality Date   ICD IMPLANT N/A 02/05/2018   Procedure: ICD IMPLANT;  Surgeon: Marinus Maw, MD;  Location: [facility] INVASIVE [facility];  Service: Cardiovascular;  Laterality: N/A;   LEFT HEART CATH AND CORONARY ANGIOGRAPHY N/A 07/01/2017   Procedure: LEFT HEART CATH AND CORONARY ANGIOGRAPHY;  Surgeon: Corky Crafts, MD;  Location: [facility] INVASIVE CV LAB;  Service: Cardiovascular;  Laterality: N/A;     Family History  Problem Relation Age of Onset   Alzheimer's disease Mother    Cancer Father      Social History   Socioeconomic History   Marital status: Single    Spouse name: Not on file   Number of children: Not on file   Years of education: Not on file   Highest education level: Not on file  Occupational History   Not on file  Tobacco Use   Smoking status: Never   Smokeless tobacco: Never  Vaping Use   Vaping Use: Never used  Substance and Sexual Activity   Alcohol use: No   Drug use: No   Sexual activity: Not Currently    Partners: Female  Other Topics Concern   Not on file  Social History Narrative   Not on file   Social Determinants of Health   Financial Resource Strain: Not on file  Food Insecurity: Not on file  Transportation Needs: Not on file  Physical Activity: Not on file  Stress: Not on file  Social Connections: Not on file  Intimate Partner Violence: Not on file     BP 102/72 (BP Location: Right Arm, Patient Position: Sitting, Cuff Size: Normal)   Pulse 63   Ht 6\' 1"  (1.854 m)   Wt 277 lb (125.6 kg)   SpO2 95%   BMI 36.55 kg/m   Physical Exam:  Well appearing NAD HEENT: Unremarkable Neck:  No JVD, no thyromegally Lymphatics:  No adenopathy Back:  No CVA  tenderness Lungs:  Clear HEART:  Regular rate rhythm, no murmurs, no rubs, no clicks Abd:  soft, positive bowel sounds, no organomegally, no rebound, no guarding Ext:  2 plus pulses, no edema, no cyanosis, no clubbing Skin:  No rashes no nodules Neuro:  CN II through XII intact, motor grossly intact  EKG - nsr with atrial paciong  DEVICE  Normal device function.  See PaceArt for details.   Assess/Plan: 1.  Ventricular tachycardia -he has had no ICD shocks. No ATP since his last visit. He will reduce the amio to 6 days a week, none on Sunday. 2.  Obesity.  I have encouraged the patient to lose weight. Fortunately he has lost over 8 lbs since his last visit. He admits to dietary indiscretion. 3.  Chronic systolic heart failure -the patient's symptoms are class II.  His fluid index is good today.   4.  ICD -his Medtronic dual-chamber ICD is working normally.  We will recheck in several months. Almost 3 years of battery longevity.   Craig Gowda Valora Norell,MD

## 2023-03-19 NOTE — Patient Instructions (Signed)
Medication Instructions:  Your physician has recommended you make the following change in your medication:   Decrease Amiodarone to Monday -Saturday none on Sunday   *If you need a refill on your cardiac medications before your next appointment, please call your pharmacy*   Lab Work: NONE   If you have labs (blood work) drawn today and your tests are completely normal, you will receive your results only by: MyChart Message (if you have MyChart) OR A paper copy in the mail If you have any lab test that is abnormal or we need to change your treatment, we will call you to review the results.   Testing/Procedures: NONE    Follow-Up: At [facility], you and your health needs are our priority.  As part of our continuing mission to provide you with exceptional heart care, we have created designated Provider Care Teams.  These Care Teams include your primary Cardiologist (physician) and Advanced Practice Providers (APPs -  Physician Assistants and Nurse Practitioners) who all work together to provide you with the care you need, when you need it.  We recommend signing up for the patient portal called "MyChart".  Sign up information is provided on this After Visit Summary.  MyChart is used to connect with patients for Virtual Visits (Telemedicine).  Patients are able to view lab/test results, encounter notes, upcoming appointments, etc.  Non-urgent messages can be sent to your provider as well.   To learn more about what you can do with MyChart, go to [email or web address].    Your next appointment:   1 year(s)  Provider:   Lewayne Bunting, MD    Other Instructions Thank you for choosing [facility]!

## 2023-04-01 DIAGNOSIS — M170 Bilateral primary osteoarthritis of knee: Secondary | ICD-10-CM | POA: Diagnosis not present

## 2023-04-08 DIAGNOSIS — M170 Bilateral primary osteoarthritis of knee: Secondary | ICD-10-CM | POA: Diagnosis not present

## 2023-04-15 DIAGNOSIS — M545 Low back pain, unspecified: Secondary | ICD-10-CM | POA: Diagnosis not present

## 2023-04-15 DIAGNOSIS — M170 Bilateral primary osteoarthritis of knee: Secondary | ICD-10-CM | POA: Diagnosis not present

## 2023-04-16 ENCOUNTER — Encounter: Payer: Self-pay | Admitting: Urology

## 2023-04-16 ENCOUNTER — Ambulatory Visit: Payer: Medicare HMO | Admitting: Urology

## 2023-04-16 VITALS — BP 99/65 | HR 90

## 2023-04-16 DIAGNOSIS — N400 Enlarged prostate without lower urinary tract symptoms: Secondary | ICD-10-CM

## 2023-04-16 DIAGNOSIS — R351 Nocturia: Secondary | ICD-10-CM | POA: Diagnosis not present

## 2023-04-16 LAB — BLADDER SCAN AMB NON-IMAGING

## 2023-04-16 LAB — URINALYSIS, ROUTINE W REFLEX MICROSCOPIC
Bilirubin, UA: NEGATIVE
Ketones, UA: NEGATIVE
Leukocytes,UA: NEGATIVE
Nitrite, UA: NEGATIVE
Protein,UA: NEGATIVE
RBC, UA: NEGATIVE
Specific Gravity, UA: 1.02 (ref 1.005–1.030)
Urobilinogen, Ur: 0.2 mg/dL (ref 0.2–1.0)
pH, UA: 5.5 (ref 5.0–7.5)

## 2023-04-16 MED ORDER — TROSPIUM CHLORIDE 20 MG PO TABS
20.0000 mg | ORAL_TABLET | Freq: Two times a day (BID) | ORAL | 11 refills | Status: DC
Start: 2023-04-16 — End: 2023-05-29

## 2023-04-16 NOTE — Progress Notes (Signed)
04/16/2023 10:24 AM   Craig Robinson [birth year] [id number]  Referring provider: Kirstie Peri, MD [address] [place],  [place] [address]  Followup nocturia   HPI: Mr Taiwo is a 72yo here for followup for BPH with nocturia. He was given samples of gemtesa last visit which improved his nocturia to 4-5x. He has a medium volume with each void at night. He remains on uroxatral 10mg  at bedtime. IPSS 17 QOL 5. He has occasional urge incontinence.    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   Asthma    Hypertension     Surgical History: Past Surgical History:  Procedure Laterality Date   ICD IMPLANT N/A 02/05/2018   Procedure: ICD IMPLANT;  Surgeon: Marinus Maw, MD;  Location: [facility] INVASIVE [facility] LAB;  Service: Cardiovascular;  Laterality: N/A;   LEFT HEART CATH AND CORONARY ANGIOGRAPHY N/A 07/01/2017   Procedure: LEFT HEART CATH AND CORONARY ANGIOGRAPHY;  Surgeon: Corky Crafts, MD;  Location: [facility] INVASIVE CV LAB;  Service: Cardiovascular;  Laterality: N/A;    Home Medications:  Allergies as of 04/16/2023   No Known Allergies      Medication List        Accurate as of April 16, 2023 10:24 AM. If you have any questions, ask your nurse or doctor.          alfuzosin 10 MG 24 hr tablet Commonly known as: UROXATRAL Take 1 tablet (10 mg total) by mouth at bedtime.   amiodarone 200 MG tablet Commonly known as: PACERONE Take 200 mg Daily Monday -Saturday and none on Sunday   apixaban 5 MG Tabs tablet Commonly known as: Eliquis Take 1 tablet (5 mg total) by mouth 2 (two) times daily.   apixaban 5 MG Tabs tablet Commonly known as: Eliquis Take 1 tablet (5 mg total) by mouth 2 (two) times daily.   atorvastatin 40 MG tablet Commonly known as: LIPITOR TAKE 1 TABLET EVERY DAY   carvedilol 25 MG tablet Commonly known as: COREG TAKE 1/2 TABLET IN THE MORNING AND TAKE 1 TABLET IN THE EVENING    dapagliflozin propanediol 10 MG Tabs tablet Commonly known as: Farxiga Take 1 tablet (10 mg total) by mouth daily before breakfast.   Entresto 97-103 MG Generic drug: sacubitril-valsartan TAKE 1 TABLET TWICE DAILY   furosemide 40 MG tablet Commonly known as: LASIX Take 40 mg by mouth daily as needed.   Gemtesa 75 MG Tabs Generic drug: Vibegron Take 1 tablet (75 mg total) by mouth daily.   mirabegron ER 25 MG Tb24 tablet Commonly known as: MYRBETRIQ Take 1 tablet (25 mg total) by mouth daily.   MISC NATURAL PRODUCTS PO Take 1 tablet by mouth daily. Prostagenics   OVER THE COUNTER MEDICATION Take 2 tablets by mouth at bedtime. Pain Relief Tablets   sildenafil 100 MG tablet Commonly known as: VIAGRA Take 1 tablet (100 mg total) by mouth as needed.   spironolactone 25 MG tablet Commonly known as: ALDACTONE TAKE 1 TABLET EVERY DAY   tamsulosin 0.4 MG Caps capsule Commonly known as: FLOMAX Take 0.4 mg by mouth 2 (two) times daily.        Allergies: No Known Allergies  Family History: Family History  Problem Relation Age of Onset   Alzheimer's disease Mother    Cancer Father     Social History:  reports that he has never smoked. He has never used smokeless tobacco. He reports that he does not  drink alcohol and does not use drugs.  ROS: All other review of systems were reviewed and are negative except what is noted above in HPI  Physical Exam: BP 99/65   Pulse 90   Constitutional:  Alert and oriented, No acute distress. HEENT: [place] AT, moist mucus membranes.  Trachea midline, no masses. Cardiovascular: No clubbing, cyanosis, or edema. Respiratory: Normal respiratory effort, no increased work of breathing. GI: Abdomen is soft, nontender, nondistended, no abdominal masses GU: No CVA tenderness.  Lymph: No cervical or inguinal lymphadenopathy. Skin: No rashes, bruises or suspicious lesions. Neurologic: Grossly intact, no focal deficits, moving all 4  extremities. Psychiatric: Normal mood and affect.  Laboratory Data: Lab Results  Component Value Date   WBC 4.6 07/26/2021   HGB 12.8 (L) 07/26/2021   HCT 39.3 07/26/2021   MCV 105.4 (H) 07/26/2021   PLT 192 07/26/2021    Lab Results  Component Value Date   CREATININE 1.34 (H) 08/24/2021    No results found for: "PSA"  No results found for: "TESTOSTERONE"  Lab Results  Component Value Date   HGBA1C 5.4 07/01/2017    Urinalysis    Component Value Date/Time   APPEARANCEUR Clear 02/19/2023 1021   GLUCOSEU 3+ (A) 02/19/2023 1021   BILIRUBINUR Negative 02/19/2023 1021   PROTEINUR Negative 02/19/2023 1021   NITRITE Negative 02/19/2023 1021   LEUKOCYTESUR Negative 02/19/2023 1021    Lab Results  Component Value Date   LABMICR Comment 02/19/2023   WBCUA 0-5 08/28/2022   LABEPIT 0-10 08/28/2022   BACTERIA Few (A) 08/28/2022    Pertinent Imaging:  No results found for this or any previous visit.  No results found for this or any previous visit.  No results found for this or any previous visit.  No results found for this or any previous visit.  No results found for this or any previous visit.  No valid procedures specified. No results found for this or any previous visit.  No results found for this or any previous visit.   Assessment & Plan:    1. Benign prostatic hyperplasia, unspecified whether lower urinary tract symptoms present -continue uroxatral 10mg  qhs - BLADDER SCAN AMB NON-IMAGING - Urinalysis, Routine w reflex microscopic  2. Nocturia We will trial sanctura 20mg  BID    No follow-ups on file.  Wilkie Aye, MD  [facility] [place]

## 2023-04-17 ENCOUNTER — Telehealth: Payer: Self-pay

## 2023-04-17 NOTE — Telephone Encounter (Signed)
Letter from pharmacy sent via cc'd chart with drug change request. Please review

## 2023-04-17 NOTE — Telephone Encounter (Signed)
Patient's medication was not filled at pharmacy. Insurance will not pay for  trospium (SANCTURA) 20 MG tablet.  Please advise

## 2023-04-18 DIAGNOSIS — M25551 Pain in right hip: Secondary | ICD-10-CM | POA: Diagnosis not present

## 2023-04-18 DIAGNOSIS — Z299 Encounter for prophylactic measures, unspecified: Secondary | ICD-10-CM | POA: Diagnosis not present

## 2023-04-18 NOTE — Telephone Encounter (Signed)
Patient is requesting the status of this medication   Please call patient back at:  [phone]  Thank you.

## 2023-04-21 ENCOUNTER — Telehealth: Payer: Self-pay

## 2023-04-21 NOTE — Telephone Encounter (Signed)
Patient called advising per pharmacy the medication below was not approved. He wanted to know if a prior approval should be done or if there is another medication that can be prescribed. He wanted to make Dr. Ronne Binning aware that he has not started the medication yet.   Medication: trospium (SANCTURA) 20 MG tablet

## 2023-04-21 NOTE — Telephone Encounter (Signed)
See other telephone encounter.

## 2023-04-21 NOTE — Telephone Encounter (Signed)
PA submitted- REF# [id number] call back [phone]

## 2023-04-22 ENCOUNTER — Encounter: Payer: Self-pay | Admitting: Urology

## 2023-04-22 NOTE — Patient Instructions (Signed)

## 2023-04-23 NOTE — Telephone Encounter (Signed)
Please see Dr. Ronne Binning order and return patient call

## 2023-04-24 MED ORDER — TOLTERODINE TARTRATE ER 4 MG PO CP24: 4.0000 mg | ORAL_CAPSULE | Freq: Every day | ORAL | 11 refills | Status: DC

## 2023-04-24 NOTE — Telephone Encounter (Signed)
Medication sent and patient called and made aware.

## 2023-05-06 ENCOUNTER — Ambulatory Visit (INDEPENDENT_AMBULATORY_CARE_PROVIDER_SITE_OTHER): Payer: Medicare HMO

## 2023-05-06 DIAGNOSIS — I428 Other cardiomyopathies: Secondary | ICD-10-CM | POA: Diagnosis not present

## 2023-05-06 LAB — CUP PACEART REMOTE DEVICE CHECK
Battery Remaining Longevity: 38 mo
Battery Voltage: 2.96 V
Brady Statistic AP VP Percent: 0.06 %
Brady Statistic AP VS Percent: 97.19 %
Brady Statistic AS VP Percent: 0 %
Brady Statistic AS VS Percent: 2.75 %
Brady Statistic RA Percent Paced: 96.51 %
Brady Statistic RV Percent Paced: 0.07 %
Date Time Interrogation Session: 20240709012502
HighPow Impedance: 72 Ohm
Implantable Lead Connection Status: 753985
Implantable Lead Connection Status: 753985
Implantable Lead Implant Date: 20190411
Implantable Lead Implant Date: 20190411
Implantable Lead Location: 753859
Implantable Lead Location: 753860
Implantable Lead Model: 5076
Implantable Lead Model: 6935
Implantable Pulse Generator Implant Date: 20190411
Lead Channel Impedance Value: 418 Ohm
Lead Channel Impedance Value: 475 Ohm
Lead Channel Impedance Value: 513 Ohm
Lead Channel Pacing Threshold Amplitude: 0.5 V
Lead Channel Pacing Threshold Amplitude: 0.625 V
Lead Channel Pacing Threshold Pulse Width: 0.4 ms
Lead Channel Pacing Threshold Pulse Width: 0.4 ms
Lead Channel Sensing Intrinsic Amplitude: 3 mV
Lead Channel Sensing Intrinsic Amplitude: 3 mV
Lead Channel Sensing Intrinsic Amplitude: 6.5 mV
Lead Channel Sensing Intrinsic Amplitude: 6.5 mV
Lead Channel Setting Pacing Amplitude: 2 V
Lead Channel Setting Pacing Amplitude: 2.5 V
Lead Channel Setting Pacing Pulse Width: 0.4 ms
Lead Channel Setting Sensing Sensitivity: 0.3 mV
Zone Setting Status: 755011
Zone Setting Status: 755011

## 2023-05-12 ENCOUNTER — Telehealth: Payer: Self-pay

## 2023-05-12 NOTE — Telephone Encounter (Signed)
[id number] Covermymed key for Medication trospium

## 2023-05-12 NOTE — Telephone Encounter (Signed)
-----   Message from Nurse Jill Side sent at 05/06/2023  3:13 PM EDT ----- Pa required

## 2023-05-13 ENCOUNTER — Telehealth: Payer: Self-pay | Admitting: Cardiology

## 2023-05-13 NOTE — Telephone Encounter (Signed)
Pt c/o medication issue:  1. Name of Medication:  apixaban (ELIQUIS) 5 MG TABS tablet  2. How are you currently taking this medication (dosage and times per day)?   3. Are you having a reaction (difficulty breathing--STAT)?   4. What is your medication issue?   Patient would like to discuss patient assistance options.

## 2023-05-14 ENCOUNTER — Other Ambulatory Visit: Payer: Self-pay | Admitting: Cardiology

## 2023-05-14 MED ORDER — APIXABAN 5 MG PO TABS: 5.0000 mg | ORAL_TABLET | Freq: Two times a day (BID) | ORAL | Status: DC

## 2023-05-14 MED ORDER — APIXABAN 5 MG PO TABS: 5.0000 mg | ORAL_TABLET | Freq: Two times a day (BID) | ORAL | 0 refills | Status: DC

## 2023-05-14 MED ORDER — APIXABAN 2.5 MG PO TABS: 2.5000 mg | ORAL_TABLET | Freq: Two times a day (BID) | ORAL | 0 refills | Status: DC

## 2023-05-14 NOTE — Telephone Encounter (Signed)
PAF provided for this medication and will be returned to [facility] d/t Dr.Branch patient Samples given per Dr.Branch

## 2023-05-29 ENCOUNTER — Other Ambulatory Visit: Payer: Self-pay

## 2023-05-29 MED ORDER — TOLTERODINE TARTRATE ER 4 MG PO CP24: 4.0000 mg | ORAL_CAPSULE | Freq: Every day | ORAL | 11 refills | Status: DC

## 2023-05-29 NOTE — Progress Notes (Signed)
Pt. switched to tolterodine 4 mg daily due to insurance not covering trospium.

## 2023-05-29 NOTE — Progress Notes (Signed)
Remote ICD transmission.   

## 2023-06-05 DIAGNOSIS — H2513 Age-related nuclear cataract, bilateral: Secondary | ICD-10-CM | POA: Diagnosis not present

## 2023-06-05 DIAGNOSIS — M5431 Sciatica, right side: Secondary | ICD-10-CM | POA: Diagnosis not present

## 2023-06-05 DIAGNOSIS — I5022 Chronic systolic (congestive) heart failure: Secondary | ICD-10-CM | POA: Diagnosis not present

## 2023-06-05 DIAGNOSIS — Z299 Encounter for prophylactic measures, unspecified: Secondary | ICD-10-CM | POA: Diagnosis not present

## 2023-06-05 DIAGNOSIS — I4891 Unspecified atrial fibrillation: Secondary | ICD-10-CM | POA: Diagnosis not present

## 2023-06-05 DIAGNOSIS — I10 Essential (primary) hypertension: Secondary | ICD-10-CM | POA: Diagnosis not present

## 2023-06-05 DIAGNOSIS — M461 Sacroiliitis, not elsewhere classified: Secondary | ICD-10-CM | POA: Diagnosis not present

## 2023-07-14 ENCOUNTER — Other Ambulatory Visit: Payer: Self-pay | Admitting: *Deleted

## 2023-07-14 MED ORDER — DAPAGLIFLOZIN PROPANEDIOL 10 MG PO TABS: 10.0000 mg | ORAL_TABLET | Freq: Every day | ORAL | 3 refills | Status: DC

## 2023-07-18 ENCOUNTER — Encounter: Payer: Self-pay | Admitting: Nurse Practitioner

## 2023-07-18 ENCOUNTER — Ambulatory Visit: Payer: Medicare HMO | Attending: Nurse Practitioner | Admitting: Nurse Practitioner

## 2023-07-18 ENCOUNTER — Telehealth: Payer: Self-pay | Admitting: Nurse Practitioner

## 2023-07-18 ENCOUNTER — Ambulatory Visit: Payer: Medicare HMO | Admitting: Urology

## 2023-07-18 VITALS — BP 108/64 | HR 63 | Ht 73.0 in | Wt 271.4 lb

## 2023-07-18 DIAGNOSIS — I10 Essential (primary) hypertension: Secondary | ICD-10-CM | POA: Diagnosis not present

## 2023-07-18 DIAGNOSIS — E785 Hyperlipidemia, unspecified: Secondary | ICD-10-CM

## 2023-07-18 DIAGNOSIS — Z8679 Personal history of other diseases of the circulatory system: Secondary | ICD-10-CM

## 2023-07-18 DIAGNOSIS — Z95810 Presence of automatic (implantable) cardiac defibrillator: Secondary | ICD-10-CM | POA: Diagnosis not present

## 2023-07-18 DIAGNOSIS — I5022 Chronic systolic (congestive) heart failure: Secondary | ICD-10-CM

## 2023-07-18 DIAGNOSIS — I480 Paroxysmal atrial fibrillation: Secondary | ICD-10-CM

## 2023-07-18 DIAGNOSIS — I4729 Other ventricular tachycardia: Secondary | ICD-10-CM | POA: Diagnosis not present

## 2023-07-18 DIAGNOSIS — I428 Other cardiomyopathies: Secondary | ICD-10-CM | POA: Diagnosis not present

## 2023-07-18 MED ORDER — APIXABAN 5 MG PO TABS: 5.0000 mg | ORAL_TABLET | Freq: Two times a day (BID) | ORAL | 0 refills | Status: DC

## 2023-07-18 MED ORDER — APIXABAN 5 MG PO TABS: 5.0000 mg | ORAL_TABLET | Freq: Two times a day (BID) | ORAL | 3 refills | Status: DC

## 2023-07-18 MED ORDER — APIXABAN 5 MG PO TABS: 5.0000 mg | ORAL_TABLET | Freq: Two times a day (BID) | ORAL | 11 refills | Status: DC

## 2023-07-18 NOTE — Patient Instructions (Addendum)

## 2023-07-18 NOTE — Telephone Encounter (Signed)
*  STAT* If patient is at the pharmacy, call can be transferred to refill team.   1. Which medications need to be refilled? (please list name of each medication and dose if known) apixaban (ELIQUIS) 5 MG TABS tablet   2. Which pharmacy/location (including street and city if local pharmacy) is medication to be sent to?  [facility] [phone] - [place],  - [address] AT [facility] [address]      3. Do they need a 30 day or 90 day supply? 90 day   Pt was seen in office this morning but this refill should've been sent to pharmacy listed above instead of [facility] mail order due to pt being out of medication.

## 2023-07-18 NOTE — Telephone Encounter (Signed)
Called and spoke to [employer].   Originally sent in order for Eliquis 30 day supply, then sent in order for 90 day supply.   Called pharmacy to make sure they d/c the Eliquis 30 day supply and that they have the order for Eliquis 90 day supply.

## 2023-07-18 NOTE — Telephone Encounter (Signed)
Prescription refill request for Eliquis received. Indication: afib  Last office visit: peck, 07/18/2023 Scr:1.11, 10/18/2022 Age: 72 yo  Weight: 123.1 kg   Refill sent.

## 2023-07-18 NOTE — Addendum Note (Signed)
Addended by: Cory Roughen on: 07/18/2023 04:57 PM   Modules accepted: Orders

## 2023-07-18 NOTE — Progress Notes (Signed)
Cardiology Office Note:  .   Date:  07/18/2023  ID:  Craig Robinson, DOB 01/09/1951, MRN [id number] PCP: Craig Peri, MD  [facility] Providers Cardiologist:  Craig Rich, MD Electrophysiologist:  Craig Bunting, MD    History of Present Illness: .   Craig Robinson is a 72 y.o. male with a PMH of chronic systolic CHF, s/p AICD (followed by EP-Dr. Ladona Robinson), NICM, PAF, hypertension, history of NSVT/VT, and history of asthma, who presents today for 24-month follow-up appointment.  Last seen by Dr. Dina Robinson on January 15, 2023.  Was doing well at that time.  Today presents for 33-month follow-up appointment.  He states he is doing well. Denies any chest pain, shortness of breath, palpitations, syncope, presyncope, dizziness, orthopnea, PND, swelling or significant weight changes, acute bleeding, or claudication.  Says he is getting on patient assistance as several of his medications are expensive per his report.  SH: In his free time enjoys mowing his yard, fishing, and bowling with friends.   ROS: Negative. See HPI.   Studies Reviewed: .    Echo 01/2021:  1. Left ventricular ejection fraction, by estimation, is 30 to 35%. The  left ventricle has moderately decreased function. The left ventricle  demonstrates global hypokinesis. The left ventricular internal cavity size  was mildly dilated. Left ventricular  diastolic parameters are consistent with Grade I diastolic dysfunction  (impaired relaxation). The average left ventricular global longitudinal  strain is 10.2 %. The global longitudinal strain is abnormal.   2. Right ventricular systolic function was not well visualized. The right  ventricular size is not well visualized.   3. The mitral valve was not well visualized. No evidence of mitral valve  regurgitation. No evidence of mitral stenosis.   4. The aortic valve has an indeterminant number of cusps. There is mild  calcification of the aortic valve. There  is mild thickening of the aortic  valve. Aortic valve regurgitation is not visualized. No aortic stenosis is  present.   5. The inferior vena cava is normal in size with greater than 50%  respiratory variability, suggesting right atrial pressure of 3 mmHg.   Comparison(s): Echocardiogram done 12/11/17 showed an EF of 20-25%. Risk Assessment/Calculations:    CHA2DS2-VASc Score = 3  This indicates a 3.2% annual risk of stroke. The patient's score is based upon: CHF History: 1 HTN History: 1 Diabetes History: 0 Stroke History: 0 Vascular Disease History: 0 Age Score: 1 Gender Score: 0    Physical Exam:   VS:  BP 108/64   Pulse 63   Ht 6\' 1"  (1.854 m)   Wt 271 lb 6.4 oz (123.1 kg)   SpO2 100%   BMI 35.81 kg/m    Wt Readings from Last 3 Encounters:  07/18/23 271 lb 6.4 oz (123.1 kg)  03/19/23 277 lb (125.6 kg)  01/15/23 285 lb 3.2 oz (129.4 kg)    GEN: Obese, 72 y.o. male in no acute distress NECK: No JVD; No carotid bruits CARDIAC: S1/S2, RRR, no murmurs, rubs, gallops RESPIRATORY:  Clear to auscultation without rales, wheezing or rhonchi  ABDOMEN: Soft, non-tender, non-distended EXTREMITIES:  No edema; No deformity   ASSESSMENT AND PLAN: .    Chronic systolic CHF, NICM Stage C, NYHA class I symptoms. He is s/p ICD. Euvolemic and well compensated on exam.  Continue carvedilol, Farxiga, Entresto, Lasix as needed, and spironolactone. Low sodium diet, fluid restriction <2L, and daily weights encouraged. Educated to contact our office for weight gain  of 2 lbs overnight or 5 lbs in one week.  PAF Denies any tachycardia or palpitations.  Heart rate is well-controlled.  Continue carvedilol and Amiodarone.  Continue Eliquis for CHA2DS2-VASc score 3. Will provide refill of Eliquis. On appropriate dosage and denies any bleeding issues. Heart healthy diet and regular cardiovascular exercise encouraged.   HTN BP stable and well controlled.  No medication changes at this time.  He is  due for upcoming labs with [facility] requested that these are faxed to our office. Heart healthy diet and regular cardiovascular exercise encouraged.   HLD LDL 38 09/2022.  Continue atorvastatin. Heart healthy diet and regular cardiovascular exercise encouraged.   Hx of ICD, hx of NSVT/VT Denies any symptoms. Followed closely by Dr. Ladona Robinson. No medication changes at this time.  Last remote device check showed normal device function.  Continue follow-up with EP.    Dispo: Have requested his upcoming labs with PCP or fax our office.  Follow-up with Dr. Dina Robinson or APP in 6 months or sooner if anything changes.  Signed, Craig Dory, NP

## 2023-07-21 ENCOUNTER — Ambulatory Visit: Payer: Medicare HMO | Admitting: Urology

## 2023-07-21 VITALS — BP 93/59 | HR 88

## 2023-07-21 DIAGNOSIS — N400 Enlarged prostate without lower urinary tract symptoms: Secondary | ICD-10-CM

## 2023-07-21 DIAGNOSIS — R351 Nocturia: Secondary | ICD-10-CM

## 2023-07-21 DIAGNOSIS — N5201 Erectile dysfunction due to arterial insufficiency: Secondary | ICD-10-CM

## 2023-07-21 MED ORDER — TOLTERODINE TARTRATE ER 4 MG PO CP24: 4.0000 mg | ORAL_CAPSULE | Freq: Every day | ORAL | 11 refills | Status: DC

## 2023-07-21 MED ORDER — ALFUZOSIN HCL ER 10 MG PO TB24: 10.0000 mg | ORAL_TABLET | Freq: Every day | ORAL | 11 refills | Status: DC

## 2023-07-21 NOTE — Progress Notes (Unsigned)
post void residual=63 ?

## 2023-07-21 NOTE — Progress Notes (Unsigned)
07/21/2023 1:59 PM   Craig Robinson 14-May-1951 [id number]  Referring provider: Kirstie Peri, MD [address] [place],  [place] [address]  Followup Nocturia and BPH   HPI: Craig Robinson is a 72yo here for followup for nocturia and BPH. He is on uroxatral 10mg  at bedtime and detrol LA 4mg  daily. IPSS 14 QOl 3. Nocturia 2-3x. [name] was not covered by insurance. He denies any urinary urgency since starting detrol. Urinary frequency every 2-3 hours. Urine stream strong   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   Asthma    Hypertension     Surgical History: Past Surgical History:  Procedure Laterality Date   ICD IMPLANT N/A 02/05/2018   Procedure: ICD IMPLANT;  Surgeon: Marinus Maw, MD;  Location: [facility]VASIVE CV LAB;  Service: Cardiovascular;  Laterality: N/A;   LEFT HEART CATH AND CORONARY ANGIOGRAPHY N/A 07/01/2017   Procedure: LEFT HEART CATH AND CORONARY ANGIOGRAPHY;  Surgeon: Corky Crafts, MD;  Location: [facility] INVASIVE CV LAB;  Service: Cardiovascular;  Laterality: N/A;    Home Medications:  Allergies as of 07/21/2023   No Known Allergies      Medication List        Accurate as of July 21, 2023  1:59 PM. If you have any questions, ask your nurse or doctor.          alfuzosin 10 MG 24 hr tablet Commonly known as: UROXATRAL Take 1 tablet (10 mg total) by mouth at bedtime.   amiodarone 200 MG tablet Commonly known as: PACERONE Take 200 mg Daily Monday -Saturday and none on Sunday   apixaban 5 MG Tabs tablet Commonly known as: ELIQUIS Take 1 tablet (5 mg total) by mouth 2 (two) times daily.   atorvastatin 40 MG tablet Commonly known as: LIPITOR TAKE 1 TABLET EVERY DAY   carvedilol 25 MG tablet Commonly known as: COREG TAKE 1/2 TABLET IN THE MORNING AND TAKE 1 TABLET IN THE EVENING   dapagliflozin propanediol 10 MG Tabs tablet Commonly known as: Farxiga Take 1 tablet (10 mg  total) by mouth daily before breakfast.   Entresto 97-103 MG Generic drug: sacubitril-valsartan TAKE 1 TABLET TWICE DAILY   furosemide 40 MG tablet Commonly known as: LASIX Take 40 mg by mouth daily as needed.   Gemtesa 75 MG Tabs Generic drug: Vibegron Take 1 tablet (75 mg total) by mouth daily.   mirabegron ER 25 MG Tb24 tablet Commonly known as: MYRBETRIQ Take 1 tablet (25 mg total) by mouth daily.   MISC NATURAL PRODUCTS PO Take 1 tablet by mouth daily. Prostagenics   OVER THE COUNTER MEDICATION Take 2 tablets by mouth at bedtime. Pain Relief Tablets   sildenafil 100 MG tablet Commonly known as: VIAGRA Take 1 tablet (100 mg total) by mouth as needed.   spironolactone 25 MG tablet Commonly known as: ALDACTONE TAKE 1 TABLET EVERY DAY   tamsulosin 0.4 MG Caps capsule Commonly known as: FLOMAX Take 0.4 mg by mouth 2 (two) times daily.   tolterodine 4 MG 24 hr capsule Commonly known as: Detrol LA Take 1 capsule (4 mg total) by mouth daily.   traMADol 50 MG tablet Commonly known as: ULTRAM Take 50 mg by mouth 2 (two) times daily as needed.        Allergies: No Known Allergies  Family History: Family History  Problem Relation Age of Onset   Alzheimer's disease Mother    Cancer Father  Social History:  reports that he has never smoked. He has never used smokeless tobacco. He reports that he does not drink alcohol and does not use drugs.  ROS: All other review of systems were reviewed and are negative except what is noted above in HPI  Physical Exam: BP (!) 93/59   Pulse 88   Constitutional:  Alert and oriented, No acute distress. HEENT: [place] AT, moist mucus membranes.  Trachea midline, no masses. Cardiovascular: No clubbing, cyanosis, or edema. Respiratory: Normal respiratory effort, no increased work of breathing. GI: Abdomen is soft, nontender, nondistended, no abdominal masses GU: No CVA tenderness.  Lymph: No cervical or inguinal  lymphadenopathy. Skin: No rashes, bruises or suspicious lesions. Neurologic: Grossly intact, no focal deficits, moving all 4 extremities. Psychiatric: Normal mood and affect.  Laboratory Data: Lab Results  Component Value Date   WBC 4.6 07/26/2021   HGB 12.8 (L) 07/26/2021   HCT 39.3 07/26/2021   MCV 105.4 (H) 07/26/2021   PLT 192 07/26/2021    Lab Results  Component Value Date   CREATININE 1.34 (H) 08/24/2021    No results found for: "PSA"  No results found for: "TESTOSTERONE"  Lab Results  Component Value Date   HGBA1C 5.4 07/01/2017    Urinalysis    Component Value Date/Time   APPEARANCEUR Clear 04/16/2023 1025   GLUCOSEU 3+ (A) 04/16/2023 1025   BILIRUBINUR Negative 04/16/2023 1025   PROTEINUR Negative 04/16/2023 1025   NITRITE Negative 04/16/2023 1025   LEUKOCYTESUR Negative 04/16/2023 1025    Lab Results  Component Value Date   LABMICR Comment 04/16/2023   WBCUA 0-5 08/28/2022   LABEPIT 0-10 08/28/2022   BACTERIA Few (A) 08/28/2022    Pertinent Imaging:  No results found for this or any previous visit.  No results found for this or any previous visit.  No results found for this or any previous visit.  No results found for this or any previous visit.  No results found for this or any previous visit.  No valid procedures specified. No results found for this or any previous visit.  No results found for this or any previous visit.   Assessment & Plan:    1. Benign prostatic hyperplasia, unspecified whether lower urinary tract symptoms present -continue uroxatral 10mg  qhs - Urinalysis, Routine w reflex microscopic - BLADDER SCAN AMB NON-IMAGING  2. Nocturia -continue detrol 4mg  daily     No follow-ups on file.  Wilkie Aye, MD  [facility] [place]

## 2023-07-22 ENCOUNTER — Encounter: Payer: Self-pay | Admitting: Urology

## 2023-07-22 NOTE — Patient Instructions (Signed)

## 2023-07-30 ENCOUNTER — Other Ambulatory Visit: Payer: Self-pay | Admitting: Cardiology

## 2023-08-05 ENCOUNTER — Ambulatory Visit: Payer: Medicare HMO

## 2023-08-05 DIAGNOSIS — I5022 Chronic systolic (congestive) heart failure: Secondary | ICD-10-CM

## 2023-08-05 DIAGNOSIS — I428 Other cardiomyopathies: Secondary | ICD-10-CM | POA: Diagnosis not present

## 2023-08-05 LAB — CUP PACEART REMOTE DEVICE CHECK
Battery Remaining Longevity: 33 mo
Battery Voltage: 2.96 V
Brady Statistic AP VP Percent: 0.07 %
Brady Statistic AP VS Percent: 94.3 %
Brady Statistic AS VP Percent: 0.01 %
Brady Statistic AS VS Percent: 5.63 %
Brady Statistic RA Percent Paced: 93.64 %
Brady Statistic RV Percent Paced: 0.08 %
Date Time Interrogation Session: 20241008043723
HighPow Impedance: 69 Ohm
Implantable Lead Connection Status: 753985
Implantable Lead Connection Status: 753985
Implantable Lead Implant Date: 20190411
Implantable Lead Implant Date: 20190411
Implantable Lead Location: 753859
Implantable Lead Location: 753860
Implantable Lead Model: 5076
Implantable Lead Model: 6935
Implantable Pulse Generator Implant Date: 20190411
Lead Channel Impedance Value: 399 Ohm
Lead Channel Impedance Value: 475 Ohm
Lead Channel Impedance Value: 475 Ohm
Lead Channel Pacing Threshold Amplitude: 0.5 V
Lead Channel Pacing Threshold Amplitude: 0.625 V
Lead Channel Pacing Threshold Pulse Width: 0.4 ms
Lead Channel Pacing Threshold Pulse Width: 0.4 ms
Lead Channel Sensing Intrinsic Amplitude: 2.875 mV
Lead Channel Sensing Intrinsic Amplitude: 2.875 mV
Lead Channel Sensing Intrinsic Amplitude: 6.375 mV
Lead Channel Sensing Intrinsic Amplitude: 6.375 mV
Lead Channel Setting Pacing Amplitude: 2 V
Lead Channel Setting Pacing Amplitude: 2.5 V
Lead Channel Setting Pacing Pulse Width: 0.4 ms
Lead Channel Setting Sensing Sensitivity: 0.3 mV
Zone Setting Status: 755011
Zone Setting Status: 755011

## 2023-08-15 DIAGNOSIS — I10 Essential (primary) hypertension: Secondary | ICD-10-CM | POA: Diagnosis not present

## 2023-08-15 DIAGNOSIS — M25562 Pain in left knee: Secondary | ICD-10-CM | POA: Diagnosis not present

## 2023-08-15 DIAGNOSIS — Z299 Encounter for prophylactic measures, unspecified: Secondary | ICD-10-CM | POA: Diagnosis not present

## 2023-08-15 DIAGNOSIS — Z23 Encounter for immunization: Secondary | ICD-10-CM | POA: Diagnosis not present

## 2023-08-21 DIAGNOSIS — H52223 Regular astigmatism, bilateral: Secondary | ICD-10-CM | POA: Diagnosis not present

## 2023-08-21 DIAGNOSIS — H524 Presbyopia: Secondary | ICD-10-CM | POA: Diagnosis not present

## 2023-08-21 NOTE — Progress Notes (Signed)
Remote ICD transmission.   

## 2023-09-04 ENCOUNTER — Telehealth: Payer: Self-pay | Admitting: Pharmacist

## 2023-09-04 NOTE — Progress Notes (Signed)
09/04/2023  Wetzel Meester 20-Aug-1951 [id number]   Reason for call: 2025 Patient Assistance Re-enrollment: Rande Brunt  Outreach:  Unsuccessful telephone call attempt #1 to patient.   HIPAA compliant voicemail left requesting a return call  Plan:  -I will make another outreach attempt to patient in 7 to 14 business days.   Reynold Bowen, PharmD Clinical Pharmacist [place] Direct Dial: [phone]

## 2023-09-09 ENCOUNTER — Telehealth: Payer: Self-pay | Admitting: Pharmacist

## 2023-09-09 NOTE — Progress Notes (Signed)
Placed follow up telephone call to Mr. Bado regarding 2025 re enrollment for [place]. After speaking with patient it was discovered that he potentially was a recipient of Medicare Extra Help or LIS. A 3 way telephone call was made to [facility] [employer] with Mr. [place]. Representative confirmed that Mr. Mangiapane is receiving LIS level 1. I have explained to Mr. Seabrook what this means for his prescription drug costs. Mr. Bolejack was appreciative of my call. I have provided patient with my telephone number and name should he have additional medication questions.   Reynold Bowen, PharmD Clinical Pharmacist [place] Direct Dial: [phone]

## 2023-09-11 DIAGNOSIS — M25561 Pain in right knee: Secondary | ICD-10-CM | POA: Diagnosis not present

## 2023-09-11 DIAGNOSIS — Z299 Encounter for prophylactic measures, unspecified: Secondary | ICD-10-CM | POA: Diagnosis not present

## 2023-09-11 DIAGNOSIS — R52 Pain, unspecified: Secondary | ICD-10-CM | POA: Diagnosis not present

## 2023-10-17 DIAGNOSIS — I10 Essential (primary) hypertension: Secondary | ICD-10-CM | POA: Diagnosis not present

## 2023-10-17 DIAGNOSIS — I951 Orthostatic hypotension: Secondary | ICD-10-CM | POA: Diagnosis not present

## 2023-10-17 DIAGNOSIS — Z299 Encounter for prophylactic measures, unspecified: Secondary | ICD-10-CM | POA: Diagnosis not present

## 2023-10-17 DIAGNOSIS — I5022 Chronic systolic (congestive) heart failure: Secondary | ICD-10-CM | POA: Diagnosis not present

## 2023-10-17 DIAGNOSIS — M25551 Pain in right hip: Secondary | ICD-10-CM | POA: Diagnosis not present

## 2023-10-27 DIAGNOSIS — Z1339 Encounter for screening examination for other mental health and behavioral disorders: Secondary | ICD-10-CM | POA: Diagnosis not present

## 2023-10-27 DIAGNOSIS — Z0000 Encounter for general adult medical examination without abnormal findings: Secondary | ICD-10-CM | POA: Diagnosis not present

## 2023-10-27 DIAGNOSIS — I5022 Chronic systolic (congestive) heart failure: Secondary | ICD-10-CM | POA: Diagnosis not present

## 2023-10-27 DIAGNOSIS — E7800 Pure hypercholesterolemia, unspecified: Secondary | ICD-10-CM | POA: Diagnosis not present

## 2023-10-27 DIAGNOSIS — I10 Essential (primary) hypertension: Secondary | ICD-10-CM | POA: Diagnosis not present

## 2023-10-27 DIAGNOSIS — Z299 Encounter for prophylactic measures, unspecified: Secondary | ICD-10-CM | POA: Diagnosis not present

## 2023-10-27 DIAGNOSIS — Z1331 Encounter for screening for depression: Secondary | ICD-10-CM | POA: Diagnosis not present

## 2023-10-27 DIAGNOSIS — Z7189 Other specified counseling: Secondary | ICD-10-CM | POA: Diagnosis not present

## 2023-10-31 ENCOUNTER — Ambulatory Visit: Payer: Medicare Other | Admitting: Urology

## 2023-10-31 VITALS — BP 111/74 | HR 86

## 2023-10-31 DIAGNOSIS — R351 Nocturia: Secondary | ICD-10-CM

## 2023-10-31 DIAGNOSIS — N400 Enlarged prostate without lower urinary tract symptoms: Secondary | ICD-10-CM | POA: Diagnosis not present

## 2023-10-31 LAB — URINALYSIS, ROUTINE W REFLEX MICROSCOPIC
Bilirubin, UA: NEGATIVE
Ketones, UA: NEGATIVE
Leukocytes,UA: NEGATIVE
Nitrite, UA: NEGATIVE
Protein,UA: NEGATIVE
RBC, UA: NEGATIVE
Specific Gravity, UA: 1.025 (ref 1.005–1.030)
Urobilinogen, Ur: 0.2 mg/dL (ref 0.2–1.0)
pH, UA: 6 (ref 5.0–7.5)

## 2023-10-31 LAB — BLADDER SCAN AMB NON-IMAGING: Scan Result: 0

## 2023-10-31 MED ORDER — ALFUZOSIN HCL ER 10 MG PO TB24: 10.0000 mg | ORAL_TABLET | Freq: Every day | ORAL | 11 refills | Status: DC

## 2023-10-31 MED ORDER — SOLIFENACIN SUCCINATE 10 MG PO TABS
10.0000 mg | ORAL_TABLET | Freq: Every day | ORAL | 11 refills | Status: DC
Start: 2023-10-31 — End: 2024-01-30

## 2023-10-31 NOTE — Progress Notes (Signed)
 post void residual=0 ?

## 2023-10-31 NOTE — Progress Notes (Signed)
 10/31/2023 10:22 AM   Craig Robinson 73 [id number]  Referring provider: Maree Isles, MD [address] [place],  [place] [address]  Followup BPH   HPI: Mr Shaker is a 73yo here for followup for BPH and nocturia. IPSS 17 QOL 4 on uroxatral  10mg  at bedtime and nocturia. He has nocturia 4x which are small volumes. He has occasional urge incontinence. Urine stream is strong. No straining to urinate. No other complaints today   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   Asthma    Hypertension     Surgical History: Past Surgical History:  Procedure Laterality Date   ICD IMPLANT N/A 02/05/2018   Procedure: ICD IMPLANT;  Surgeon: Waddell Danelle ORN, MD;  Location: [facility] INVASIVE CV LAB;  Service: Cardiovascular;  Laterality: N/A;   LEFT HEART CATH AND CORONARY ANGIOGRAPHY N/A 07/01/2017   Procedure: LEFT HEART CATH AND CORONARY ANGIOGRAPHY;  Surgeon: Dann Candyce RAMAN, MD;  Location: [facility]VAS[facility] CV LAB;  Service: Cardiovascular;  Laterality: N/A;    Home Medications:  Allergies as of 10/31/2023   No Known Allergies      Medication List        Accurate as of October 31, 2023 10:22 AM. If you have any questions, ask your nurse or doctor.          alfuzosin  10 MG 24 hr tablet Commonly known as: UROXATRAL  Take 1 tablet (10 mg total) by mouth at bedtime.   amiodarone  200 MG tablet Commonly known as: PACERONE  Take 200 mg Daily Monday -Saturday and none on Sunday   apixaban  5 MG Tabs tablet Commonly known as: ELIQUIS  Take 1 tablet (5 mg total) by mouth 2 (two) times daily.   atorvastatin  40 MG tablet Commonly known as: LIPITOR TAKE 1 TABLET EVERY DAY   carvedilol  25 MG tablet Commonly known as: COREG  TAKE 1/2 TABLET IN THE MORNING AND TAKE 1 TABLET IN THE EVENING   dapagliflozin  propanediol 10 MG Tabs tablet Commonly known as: Farxiga  Take 1 tablet (10 mg total) by mouth daily before  breakfast.   Entresto  97-103 MG Generic drug: sacubitril -valsartan  TAKE 1 TABLET TWICE DAILY   furosemide  40 MG tablet Commonly known as: LASIX  Take 40 mg by mouth daily as needed.   MISC NATURAL PRODUCTS PO Take 1 tablet by mouth daily. Prostagenics   OVER THE COUNTER MEDICATION Take 2 tablets by mouth at bedtime. Pain Relief Tablets   sildenafil  100 MG tablet Commonly known as: VIAGRA  Take 1 tablet (100 mg total) by mouth as needed.   spironolactone  25 MG tablet Commonly known as: ALDACTONE  TAKE 1 TABLET EVERY DAY   tolterodine  4 MG 24 hr capsule Commonly known as: Detrol  LA Take 1 capsule (4 mg total) by mouth daily.   traMADol 50 MG tablet Commonly known as: ULTRAM Take 50 mg by mouth 2 (two) times daily as needed.        Allergies: No Known Allergies  Family History: Family History  Problem Relation Age of Onset   Alzheimer's disease Mother    Cancer Father     Social History:  reports that he has never smoked. He has never used smokeless tobacco. He reports that he does not drink alcohol  and does not use drugs.  ROS: All other review of systems were reviewed and are negative except what is noted above in HPI  Physical Exam: BP 111/74   Pulse 86   Constitutional:  Alert and oriented, No acute  distress. HEENT: [place] AT, moist mucus membranes.  Trachea midline, no masses. Cardiovascular: No clubbing, cyanosis, or edema. Respiratory: Normal respiratory effort, no increased work of breathing. GI: Abdomen is soft, nontender, nondistended, no abdominal masses GU: No CVA tenderness.  Lymph: No cervical or inguinal lymphadenopathy. Skin: No rashes, bruises or suspicious lesions. Neurologic: Grossly intact, no focal deficits, moving all 4 extremities. Psychiatric: Normal mood and affect.  Laboratory Data: Lab Results  Component Value Date   WBC 4.6 07/26/2021   HGB 12.8 (L) 07/26/2021   HCT 39.3 07/26/2021   MCV 105.4 (H) 07/26/2021   PLT 192 07/26/2021     Lab Results  Component Value Date   CREATININE 1.34 (H) 08/24/2021    No results found for: PSA  No results found for: TESTOSTERONE  Lab Results  Component Value Date   HGBA1C 5.4 07/01/2017    Urinalysis    Component Value Date/Time   APPEARANCEUR Clear 04/16/2023 1025   GLUCOSEU 3+ (A) 04/16/2023 1025   BILIRUBINUR Negative 04/16/2023 1025   PROTEINUR Negative 04/16/2023 1025   NITRITE Negative 04/16/2023 1025   LEUKOCYTESUR Negative 04/16/2023 1025    Lab Results  Component Value Date   LABMICR Comment 04/16/2023   WBCUA 0-5 08/28/2022   LABEPIT 0-10 08/28/2022   BACTERIA Few (A) 08/28/2022    Pertinent Imaging:  No results found for this or any previous visit.  No results found for this or any previous visit.  No results found for this or any previous visit.  No results found for this or any previous visit.  No results found for this or any previous visit.  No results found for this or any previous visit.  No results found for this or any previous visit.  No results found for this or any previous visit.   Assessment & Plan:    1. Benign prostatic hyperplasia, unspecified whether lower urinary tract symptoms present (Primary) Continue uroxatral  10mg  qhs - BLADDER SCAN AMB NON-IMAGING - Urinalysis, Routine w reflex microscopic  2. Nocturia We will trial vesicare  10mg  qhs   No follow-ups on file.  [name], MD  [facility] [place]

## 2023-11-04 ENCOUNTER — Ambulatory Visit (INDEPENDENT_AMBULATORY_CARE_PROVIDER_SITE_OTHER): Payer: Medicare HMO

## 2023-11-04 DIAGNOSIS — I428 Other cardiomyopathies: Secondary | ICD-10-CM | POA: Diagnosis not present

## 2023-11-04 DIAGNOSIS — I5022 Chronic systolic (congestive) heart failure: Secondary | ICD-10-CM

## 2023-11-05 LAB — CUP PACEART REMOTE DEVICE CHECK
Battery Remaining Longevity: 31 mo
Battery Voltage: 2.95 V
Brady Statistic AP VP Percent: 0.05 %
Brady Statistic AP VS Percent: 94.81 %
Brady Statistic AS VP Percent: 0 %
Brady Statistic AS VS Percent: 5.13 %
Brady Statistic RA Percent Paced: 94.27 %
Brady Statistic RV Percent Paced: 0.06 %
Date Time Interrogation Session: 20250107031703
HighPow Impedance: 75 Ohm
Implantable Lead Connection Status: 753985
Implantable Lead Connection Status: 753985
Implantable Lead Implant Date: 20190411
Implantable Lead Implant Date: 20190411
Implantable Lead Location: 753859
Implantable Lead Location: 753860
Implantable Lead Model: 5076
Implantable Lead Model: 6935
Implantable Pulse Generator Implant Date: 20190411
Lead Channel Impedance Value: 399 Ohm
Lead Channel Impedance Value: 475 Ohm
Lead Channel Impedance Value: 475 Ohm
Lead Channel Pacing Threshold Amplitude: 0.625 V
Lead Channel Pacing Threshold Amplitude: 0.625 V
Lead Channel Pacing Threshold Pulse Width: 0.4 ms
Lead Channel Pacing Threshold Pulse Width: 0.4 ms
Lead Channel Sensing Intrinsic Amplitude: 3 mV
Lead Channel Sensing Intrinsic Amplitude: 3 mV
Lead Channel Sensing Intrinsic Amplitude: 6.75 mV
Lead Channel Sensing Intrinsic Amplitude: 6.75 mV
Lead Channel Setting Pacing Amplitude: 2 V
Lead Channel Setting Pacing Amplitude: 2.5 V
Lead Channel Setting Pacing Pulse Width: 0.4 ms
Lead Channel Setting Sensing Sensitivity: 0.3 mV
Zone Setting Status: 755011
Zone Setting Status: 755011

## 2023-11-09 ENCOUNTER — Encounter: Payer: Self-pay | Admitting: Urology

## 2023-11-09 NOTE — Patient Instructions (Signed)

## 2023-12-11 ENCOUNTER — Other Ambulatory Visit: Payer: Self-pay

## 2023-12-11 ENCOUNTER — Other Ambulatory Visit: Payer: Self-pay | Admitting: *Deleted

## 2023-12-11 MED ORDER — SACUBITRIL-VALSARTAN 97-103 MG PO TABS: 1.0000 | ORAL_TABLET | Freq: Two times a day (BID) | ORAL | 1 refills | Status: DC

## 2023-12-11 MED ORDER — CARVEDILOL 25 MG PO TABS: ORAL_TABLET | ORAL | 2 refills | Status: DC

## 2023-12-11 MED ORDER — SPIRONOLACTONE 25 MG PO TABS: 25.0000 mg | ORAL_TABLET | Freq: Every day | ORAL | 1 refills | Status: DC

## 2023-12-11 MED ORDER — ATORVASTATIN CALCIUM 40 MG PO TABS
40.0000 mg | ORAL_TABLET | Freq: Every day | ORAL | 1 refills | Status: DC
Start: 2023-12-11 — End: 2024-02-05

## 2023-12-11 NOTE — Telephone Encounter (Signed)
Refilled coreg 12.5 mg , 1/2 tab am and 1 tab pm to [facility], #135, RF:2

## 2023-12-16 NOTE — Progress Notes (Signed)
 Remote ICD transmission.

## 2023-12-16 NOTE — Addendum Note (Signed)
Addended by: Geralyn Flash D on: 12/16/2023 11:59 AM   Modules accepted: Orders

## 2024-01-09 DIAGNOSIS — I5022 Chronic systolic (congestive) heart failure: Secondary | ICD-10-CM | POA: Diagnosis not present

## 2024-01-09 DIAGNOSIS — M25561 Pain in right knee: Secondary | ICD-10-CM | POA: Diagnosis not present

## 2024-01-09 DIAGNOSIS — Z299 Encounter for prophylactic measures, unspecified: Secondary | ICD-10-CM | POA: Diagnosis not present

## 2024-01-09 DIAGNOSIS — M25562 Pain in left knee: Secondary | ICD-10-CM | POA: Diagnosis not present

## 2024-01-09 DIAGNOSIS — I10 Essential (primary) hypertension: Secondary | ICD-10-CM | POA: Diagnosis not present

## 2024-01-09 DIAGNOSIS — I4891 Unspecified atrial fibrillation: Secondary | ICD-10-CM | POA: Diagnosis not present

## 2024-01-13 DIAGNOSIS — I10 Essential (primary) hypertension: Secondary | ICD-10-CM | POA: Diagnosis not present

## 2024-01-13 DIAGNOSIS — Z299 Encounter for prophylactic measures, unspecified: Secondary | ICD-10-CM | POA: Diagnosis not present

## 2024-01-13 DIAGNOSIS — M25561 Pain in right knee: Secondary | ICD-10-CM | POA: Diagnosis not present

## 2024-01-13 DIAGNOSIS — I5022 Chronic systolic (congestive) heart failure: Secondary | ICD-10-CM | POA: Diagnosis not present

## 2024-01-21 DIAGNOSIS — M25662 Stiffness of left knee, not elsewhere classified: Secondary | ICD-10-CM | POA: Diagnosis not present

## 2024-01-21 DIAGNOSIS — M25661 Stiffness of right knee, not elsewhere classified: Secondary | ICD-10-CM | POA: Diagnosis not present

## 2024-01-21 DIAGNOSIS — R29898 Other symptoms and signs involving the musculoskeletal system: Secondary | ICD-10-CM | POA: Diagnosis not present

## 2024-01-21 DIAGNOSIS — R2689 Other abnormalities of gait and mobility: Secondary | ICD-10-CM | POA: Diagnosis not present

## 2024-01-21 DIAGNOSIS — M25562 Pain in left knee: Secondary | ICD-10-CM | POA: Diagnosis not present

## 2024-01-21 DIAGNOSIS — M25561 Pain in right knee: Secondary | ICD-10-CM | POA: Diagnosis not present

## 2024-01-22 ENCOUNTER — Ambulatory Visit: Payer: Medicare HMO | Attending: Cardiology | Admitting: Cardiology

## 2024-01-22 ENCOUNTER — Encounter: Payer: Self-pay | Admitting: Cardiology

## 2024-01-22 VITALS — BP 110/64 | HR 89 | Ht 73.0 in | Wt 286.0 lb

## 2024-01-22 DIAGNOSIS — I10 Essential (primary) hypertension: Secondary | ICD-10-CM

## 2024-01-22 DIAGNOSIS — I4729 Other ventricular tachycardia: Secondary | ICD-10-CM

## 2024-01-22 DIAGNOSIS — I428 Other cardiomyopathies: Secondary | ICD-10-CM | POA: Diagnosis not present

## 2024-01-22 DIAGNOSIS — I480 Paroxysmal atrial fibrillation: Secondary | ICD-10-CM | POA: Diagnosis not present

## 2024-01-22 DIAGNOSIS — E785 Hyperlipidemia, unspecified: Secondary | ICD-10-CM | POA: Diagnosis not present

## 2024-01-22 DIAGNOSIS — I5022 Chronic systolic (congestive) heart failure: Secondary | ICD-10-CM

## 2024-01-22 NOTE — Patient Instructions (Signed)

## 2024-01-22 NOTE — Progress Notes (Signed)
 Clinical Summary [name] is a 73 y.o.male seen for follow up of the following medical problems.      1. Chronic systolic heart failure - 06/2017 echo LVEF 25-30%, diffuse hypokinesis, grade I diastolic dysfunction - 06/2017 cath without obstructive CAD - 11/2017 LVEF 20-25% - AICD followed by Dr Ladona Ridgel   01/2021 echo LVEF 30-35%.    - no SOB/DOE, no recent edema - he is compliant with meds      2. 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 09/2022 normal LFTs and TSH  - Normal LFTs 09/2023  - no recent palpitations - Jan 2025 normal device check - EKG today A paced V sensed     3. PAF  - denies any palpitations - no bleeding on eliquis  4. Hyperlipidemia     09/2022 TC 102 TG 35 HDL 54 LDL 38   5.HTN -he is compliant with meds   Past Medical History:  Diagnosis Date   AICD (automatic cardioverter/defibrillator) present 02/05/2018   Asthma    Hypertension      No Known Allergies   Current Outpatient Medications  Medication Sig Dispense Refill   alfuzosin (UROXATRAL) 10 MG 24 hr tablet Take 1 tablet (10 mg total) by mouth at bedtime. 30 tablet 11   amiodarone (PACERONE) 200 MG tablet Take 200 mg Daily Monday -Saturday and none on Sunday 90 tablet 3   apixaban (ELIQUIS) 5 MG TABS tablet Take 1 tablet (5 mg total) by mouth 2 (two) times daily. 180 tablet 0   atorvastatin (LIPITOR) 40 MG tablet Take 1 tablet (40 mg total) by mouth daily. 90 tablet 1   carvedilol (COREG) 25 MG tablet TAKE 1/2 TABLET IN THE MORNING  AND TAKE 1 TABLET IN THE EVENING 135 tablet 2   dapagliflozin propanediol (FARXIGA) 10 MG TABS tablet Take 1 tablet (10 mg total) by mouth daily before breakfast. 90 tablet 3   furosemide (LASIX) 40 MG tablet Take 40 mg by mouth daily as needed.     MISC NATURAL PRODUCTS PO Take 1 tablet by mouth daily. Prostagenics     OVER THE COUNTER MEDICATION Take 2 tablets by mouth at bedtime. Pain Relief Tablets      sacubitril-valsartan (ENTRESTO) 97-103 MG Take 1 tablet by mouth 2 (two) times daily. 180 tablet 1   sildenafil (VIAGRA) 100 MG tablet Take 1 tablet (100 mg total) by mouth as needed. 10 tablet 5   solifenacin (VESICARE) 10 MG tablet Take 1 tablet (10 mg total) by mouth daily. 30 tablet 11   spironolactone (ALDACTONE) 25 MG tablet Take 1 tablet (25 mg total) by mouth daily. 90 tablet 1   tolterodine (DETROL LA) 4 MG 24 hr capsule Take 1 capsule (4 mg total) by mouth daily. 30 capsule 11   traMADol (ULTRAM) 50 MG tablet Take 50 mg by mouth 2 (two) times daily as needed.     No current facility-administered medications for this visit.     Past Surgical History:  Procedure Laterality Date   ICD IMPLANT N/A 02/05/2018   Procedure: ICD IMPLANT;  Surgeon: Marinus Maw, MD;  Location: [facility] INVASIVE CV LAB;  Service: Cardiovascular;  Laterality: N/A;   LEFT HEART CATH AND CORONARY ANGIOGRAPHY N/A 07/01/2017   Procedure: LEFT HEART CATH AND CORONARY ANGIOGRAPHY;  Surgeon: Corky Crafts, MD;  Location: [facility] INVASIVE CV LAB;  Service: Cardiovascular;  Laterality: N/A;     No  Known Allergies    Family History  Problem Relation Age of Onset   Alzheimer's disease Mother    Cancer Father      Social History Craig Robinson reports that he has never smoked. He has never used smokeless tobacco. Craig Robinson reports no history of alcohol use.     Physical Examination Today's Vitals   01/22/24 1020  BP: 110/64  Pulse: 89  SpO2: 97%  Weight: 286 lb (129.7 kg)  Height: 6\' 1"  (1.854 m)   Body mass index is 37.73 kg/m.  Gen: resting comfortably, no acute distress HEENT: no scleral icterus, pupils equal round and reactive, no palptable cervical adenopathy,  CV: RRR, no m/rg, no jvd Resp: Clear to auscultation bilaterally GI: abdomen is soft, non-tender, non-distended, normal bowel sounds, no hepatosplenomegaly MSK: extremities are warm, no edema.  Skin: warm, no rash Neuro:  no  focal deficits Psych: appropriate affect   Diagnostic Studies 01/2021 echo 1. Left ventricular ejection fraction, by estimation, is 30 to 35%. The  left ventricle has moderately decreased function. The left ventricle  demonstrates global hypokinesis. The left ventricular internal cavity size  was mildly dilated. Left ventricular  diastolic parameters are consistent with Grade I diastolic dysfunction  (impaired relaxation). The average left ventricular global longitudinal  strain is 10.2 %. The global longitudinal strain is abnormal.   2. Right ventricular systolic function was not well visualized. The right  ventricular size is not well visualized.   3. The mitral valve was not well visualized. No evidence of mitral valve  regurgitation. No evidence of mitral stenosis.   4. The aortic valve has an indeterminant number of cusps. There is mild  calcification of the aortic valve. There is mild thickening of the aortic  valve. Aortic valve regurgitation is not visualized. No aortic stenosis is  present.   5. The inferior vena cava is normal in size with greater than 50%  respiratory variability, suggesting right atrial pressure of 3 mmHg.     Assessment and Plan   1. Chronic systolic HF -no recent symptoms, euvolemic today - continue current meds   2. NSVT/VT - has ICD in place, started on amiodarone by EP - recent normal device check, no symptoms - conatinue to monitor - recent normal LFTs, if has not had updated TSH with pcp by next visit will order     3. Afib/acquired thrombophilia - no symptoms - EKG today shows a paced v sensed - continue current meds including eliquis for stroke prevention   4. Hyperlipidemia -well controlled, continue current meds    Antoine Poche, M.D.

## 2024-01-30 ENCOUNTER — Ambulatory Visit: Payer: Medicare HMO | Admitting: Urology

## 2024-01-30 VITALS — BP 105/65 | HR 85

## 2024-01-30 DIAGNOSIS — R351 Nocturia: Secondary | ICD-10-CM

## 2024-01-30 DIAGNOSIS — N400 Enlarged prostate without lower urinary tract symptoms: Secondary | ICD-10-CM | POA: Diagnosis not present

## 2024-01-30 LAB — URINALYSIS, ROUTINE W REFLEX MICROSCOPIC
Bilirubin, UA: NEGATIVE
Ketones, UA: NEGATIVE
Leukocytes,UA: NEGATIVE
Nitrite, UA: NEGATIVE
Protein,UA: NEGATIVE
RBC, UA: NEGATIVE
Specific Gravity, UA: 1.025 (ref 1.005–1.030)
Urobilinogen, Ur: 0.2 mg/dL (ref 0.2–1.0)
pH, UA: 6 (ref 5.0–7.5)

## 2024-01-30 LAB — BLADDER SCAN AMB NON-IMAGING: Scan Result: 4

## 2024-01-30 MED ORDER — SOLIFENACIN SUCCINATE 10 MG PO TABS: 10.0000 mg | ORAL_TABLET | Freq: Every day | ORAL | 11 refills | Status: DC

## 2024-01-30 MED ORDER — ALFUZOSIN HCL ER 10 MG PO TB24: 10.0000 mg | ORAL_TABLET | Freq: Every day | ORAL | 11 refills | Status: DC

## 2024-01-30 NOTE — Progress Notes (Signed)
post void residual =4

## 2024-01-30 NOTE — Progress Notes (Signed)
 01/30/2024 8:46 AM   Craig Robinson 10-20-1951 [id number]  Referring provider: Theoplis Fix, MD [address] [place],  [place] [address]  Followup nocturia   HPI: Craig Robinson is a 73yo here for followup for BPH with nocturia and urinary frequency. IPSS 19 QOL 4 on uroxatral  and vesicare  10mg . Nocturia 4x. Urinary frequency every 2 hours. Uirne stream is strong. No straining to urinate. No toehr complaints today   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   Asthma    Hypertension     Surgical History: Past Surgical History:  Procedure Laterality Date   ICD IMPLANT N/A 02/05/2018   Procedure: ICD IMPLANT;  Surgeon: Tammie Fall, MD;  Location: [facility] INVASIVE CV LAB;  Service: Cardiovascular;  Laterality: N/A;   LEFT HEART CATH AND CORONARY ANGIOGRAPHY N/A 07/01/2017   Procedure: LEFT HEART CATH AND CORONARY ANGIOGRAPHY;  Surgeon: Lucendia Rusk, MD;  Location: [facility] INVASIVE CV LAB;  Service: Cardiovascular;  Laterality: N/A;    Home Medications:  Allergies as of 01/30/2024   No Known Allergies      Medication List        Accurate as of January 30, 2024  8:46 AM. If you have any questions, ask your nurse or doctor.          alfuzosin  10 MG 24 hr tablet Commonly known as: UROXATRAL  Take 1 tablet (10 mg total) by mouth at bedtime.   amiodarone  200 MG tablet Commonly known as: PACERONE  Take 200 mg Daily Monday -Saturday and none on Sunday   apixaban  5 MG Tabs tablet Commonly known as: ELIQUIS  Take 1 tablet (5 mg total) by mouth 2 (two) times daily.   atorvastatin  40 MG tablet Commonly known as: LIPITOR Take 1 tablet (40 mg total) by mouth daily.   carvedilol  25 MG tablet Commonly known as: COREG  TAKE 1/2 TABLET IN THE MORNING  AND TAKE 1 TABLET IN THE EVENING   dapagliflozin  propanediol 10 MG Tabs tablet Commonly known as: Farxiga  Take 1 tablet (10 mg total) by mouth daily before  breakfast.   furosemide  40 MG tablet Commonly known as: LASIX  Take 40 mg by mouth daily as needed.   MISC NATURAL PRODUCTS PO Take 1 tablet by mouth daily. Prostagenics   OVER THE COUNTER MEDICATION Take 2 tablets by mouth at bedtime. Pain Relief Tablets   sacubitril -valsartan  97-103 MG Commonly known as: Entresto  Take 1 tablet by mouth 2 (two) times daily.   sildenafil  100 MG tablet Commonly known as: VIAGRA  Take 1 tablet (100 mg total) by mouth as needed.   solifenacin  10 MG tablet Commonly known as: VESICARE  Take 1 tablet (10 mg total) by mouth daily.   spironolactone  25 MG tablet Commonly known as: ALDACTONE  Take 1 tablet (25 mg total) by mouth daily.   tolterodine  4 MG 24 hr capsule Commonly known as: Detrol  LA Take 1 capsule (4 mg total) by mouth daily.   traMADol 50 MG tablet Commonly known as: ULTRAM Take 50 mg by mouth 2 (two) times daily as needed.        Allergies: No Known Allergies  Family History: Family History  Problem Relation Age of Onset   Alzheimer's disease Mother    Cancer Father     Social History:  reports that he has never smoked. He has never used smokeless tobacco. He reports that he does not drink alcohol  and does not use drugs.  ROS: All other review of systems were  reviewed and are negative except what is noted above in HPI  Physical Exam: BP 105/65   Pulse 85   Constitutional:  Alert and oriented, No acute distress. HEENT: [place] AT, moist mucus membranes.  Trachea midline, no masses. Cardiovascular: No clubbing, cyanosis, or edema. Respiratory: Normal respiratory effort, no increased work of breathing. GI: Abdomen is soft, nontender, nondistended, no abdominal masses GU: No CVA tenderness.  Lymph: No cervical or inguinal lymphadenopathy. Skin: No rashes, bruises or suspicious lesions. Neurologic: Grossly intact, no focal deficits, moving all 4 extremities. Psychiatric: Normal mood and affect.  Laboratory Data: Lab  Results  Component Value Date   WBC 4.6 07/26/2021   HGB 12.8 (L) 07/26/2021   HCT 39.3 07/26/2021   MCV 105.4 (H) 07/26/2021   PLT 192 07/26/2021    Lab Results  Component Value Date   CREATININE 1.34 (H) 08/24/2021    No results found for: "PSA"  No results found for: "TESTOSTERONE"  Lab Results  Component Value Date   HGBA1C 5.4 07/01/2017    Urinalysis    Component Value Date/Time   APPEARANCEUR Clear 10/31/2023 1006   GLUCOSEU 3+ (A) 10/31/2023 1006   BILIRUBINUR Negative 10/31/2023 1006   PROTEINUR Negative 10/31/2023 1006   NITRITE Negative 10/31/2023 1006   LEUKOCYTESUR Negative 10/31/2023 1006    Lab Results  Component Value Date   LABMICR Comment 10/31/2023   WBCUA 0-5 08/28/2022   LABEPIT 0-10 08/28/2022   BACTERIA Few (A) 08/28/2022    Pertinent Imaging:  No results found for this or any previous visit.  No results found for this or any previous visit.  No results found for this or any previous visit.  No results found for this or any previous visit.  No results found for this or any previous visit.  No results found for this or any previous visit.  No results found for this or any previous visit.  No results found for this or any previous visit.   Assessment & Plan:    1. Benign prostatic hyperplasia, unspecified whether lower urinary tract symptoms present (Primary) Continue uroxatral  10mg  qhs - Urinalysis, Routine w reflex microscopic - BLADDER SCAN AMB NON-IMAGING  2. Nocturia Continue uroxatral  10mg  at bedtime  3. Uirnary frequency -vesicare  10mg  daily   No follow-ups on file.  Johnie Nailer, MD  [facility] [place]

## 2024-02-03 ENCOUNTER — Ambulatory Visit (INDEPENDENT_AMBULATORY_CARE_PROVIDER_SITE_OTHER): Payer: Medicare HMO

## 2024-02-03 DIAGNOSIS — I428 Other cardiomyopathies: Secondary | ICD-10-CM | POA: Diagnosis not present

## 2024-02-04 ENCOUNTER — Other Ambulatory Visit: Payer: Self-pay | Admitting: Cardiology

## 2024-02-04 DIAGNOSIS — R29898 Other symptoms and signs involving the musculoskeletal system: Secondary | ICD-10-CM | POA: Diagnosis not present

## 2024-02-04 DIAGNOSIS — R2689 Other abnormalities of gait and mobility: Secondary | ICD-10-CM | POA: Diagnosis not present

## 2024-02-04 DIAGNOSIS — M25662 Stiffness of left knee, not elsewhere classified: Secondary | ICD-10-CM | POA: Diagnosis not present

## 2024-02-04 DIAGNOSIS — M25561 Pain in right knee: Secondary | ICD-10-CM | POA: Diagnosis not present

## 2024-02-04 DIAGNOSIS — M25562 Pain in left knee: Secondary | ICD-10-CM | POA: Diagnosis not present

## 2024-02-04 DIAGNOSIS — M25661 Stiffness of right knee, not elsewhere classified: Secondary | ICD-10-CM | POA: Diagnosis not present

## 2024-02-04 LAB — CUP PACEART REMOTE DEVICE CHECK
Battery Remaining Longevity: 28 mo
Battery Voltage: 2.95 V
Brady Statistic AP VP Percent: 0.06 %
Brady Statistic AP VS Percent: 95.36 %
Brady Statistic AS VP Percent: 0 %
Brady Statistic AS VS Percent: 4.58 %
Brady Statistic RA Percent Paced: 94.76 %
Brady Statistic RV Percent Paced: 0.07 %
Date Time Interrogation Session: 20250408022703
HighPow Impedance: 66 Ohm
Implantable Lead Connection Status: 753985
Implantable Lead Connection Status: 753985
Implantable Lead Implant Date: 20190411
Implantable Lead Implant Date: 20190411
Implantable Lead Location: 753859
Implantable Lead Location: 753860
Implantable Lead Model: 5076
Implantable Lead Model: 6935
Implantable Pulse Generator Implant Date: 20190411
Lead Channel Impedance Value: 399 Ohm
Lead Channel Impedance Value: 456 Ohm
Lead Channel Impedance Value: 475 Ohm
Lead Channel Pacing Threshold Amplitude: 0.625 V
Lead Channel Pacing Threshold Amplitude: 0.75 V
Lead Channel Pacing Threshold Pulse Width: 0.4 ms
Lead Channel Pacing Threshold Pulse Width: 0.4 ms
Lead Channel Sensing Intrinsic Amplitude: 2.25 mV
Lead Channel Sensing Intrinsic Amplitude: 2.25 mV
Lead Channel Sensing Intrinsic Amplitude: 5.375 mV
Lead Channel Sensing Intrinsic Amplitude: 5.375 mV
Lead Channel Setting Pacing Amplitude: 2 V
Lead Channel Setting Pacing Amplitude: 2.5 V
Lead Channel Setting Pacing Pulse Width: 0.4 ms
Lead Channel Setting Sensing Sensitivity: 0.3 mV
Zone Setting Status: 755011
Zone Setting Status: 755011

## 2024-02-10 DIAGNOSIS — M25662 Stiffness of left knee, not elsewhere classified: Secondary | ICD-10-CM | POA: Diagnosis not present

## 2024-02-10 DIAGNOSIS — M25562 Pain in left knee: Secondary | ICD-10-CM | POA: Diagnosis not present

## 2024-02-10 DIAGNOSIS — R29898 Other symptoms and signs involving the musculoskeletal system: Secondary | ICD-10-CM | POA: Diagnosis not present

## 2024-02-10 DIAGNOSIS — M25561 Pain in right knee: Secondary | ICD-10-CM | POA: Diagnosis not present

## 2024-02-10 DIAGNOSIS — M25661 Stiffness of right knee, not elsewhere classified: Secondary | ICD-10-CM | POA: Diagnosis not present

## 2024-02-10 DIAGNOSIS — R2689 Other abnormalities of gait and mobility: Secondary | ICD-10-CM | POA: Diagnosis not present

## 2024-02-13 DIAGNOSIS — R29898 Other symptoms and signs involving the musculoskeletal system: Secondary | ICD-10-CM | POA: Diagnosis not present

## 2024-02-13 DIAGNOSIS — M25562 Pain in left knee: Secondary | ICD-10-CM | POA: Diagnosis not present

## 2024-02-13 DIAGNOSIS — R2689 Other abnormalities of gait and mobility: Secondary | ICD-10-CM | POA: Diagnosis not present

## 2024-02-13 DIAGNOSIS — M25662 Stiffness of left knee, not elsewhere classified: Secondary | ICD-10-CM | POA: Diagnosis not present

## 2024-02-13 DIAGNOSIS — M25661 Stiffness of right knee, not elsewhere classified: Secondary | ICD-10-CM | POA: Diagnosis not present

## 2024-02-13 DIAGNOSIS — M25561 Pain in right knee: Secondary | ICD-10-CM | POA: Diagnosis not present

## 2024-02-15 ENCOUNTER — Encounter: Payer: Self-pay | Admitting: Urology

## 2024-02-15 NOTE — Patient Instructions (Signed)

## 2024-02-18 ENCOUNTER — Other Ambulatory Visit: Payer: Self-pay | Admitting: Internal Medicine

## 2024-02-18 DIAGNOSIS — I4891 Unspecified atrial fibrillation: Secondary | ICD-10-CM

## 2024-02-20 DIAGNOSIS — M25562 Pain in left knee: Secondary | ICD-10-CM | POA: Diagnosis not present

## 2024-02-20 DIAGNOSIS — R2689 Other abnormalities of gait and mobility: Secondary | ICD-10-CM | POA: Diagnosis not present

## 2024-02-20 DIAGNOSIS — M25662 Stiffness of left knee, not elsewhere classified: Secondary | ICD-10-CM | POA: Diagnosis not present

## 2024-02-20 DIAGNOSIS — R29898 Other symptoms and signs involving the musculoskeletal system: Secondary | ICD-10-CM | POA: Diagnosis not present

## 2024-02-20 DIAGNOSIS — M25661 Stiffness of right knee, not elsewhere classified: Secondary | ICD-10-CM | POA: Diagnosis not present

## 2024-02-20 DIAGNOSIS — M25561 Pain in right knee: Secondary | ICD-10-CM | POA: Diagnosis not present

## 2024-02-23 DIAGNOSIS — M25562 Pain in left knee: Secondary | ICD-10-CM | POA: Diagnosis not present

## 2024-02-23 DIAGNOSIS — M25662 Stiffness of left knee, not elsewhere classified: Secondary | ICD-10-CM | POA: Diagnosis not present

## 2024-02-23 DIAGNOSIS — M25561 Pain in right knee: Secondary | ICD-10-CM | POA: Diagnosis not present

## 2024-02-23 DIAGNOSIS — R29898 Other symptoms and signs involving the musculoskeletal system: Secondary | ICD-10-CM | POA: Diagnosis not present

## 2024-02-23 DIAGNOSIS — R2689 Other abnormalities of gait and mobility: Secondary | ICD-10-CM | POA: Diagnosis not present

## 2024-02-23 DIAGNOSIS — M25661 Stiffness of right knee, not elsewhere classified: Secondary | ICD-10-CM | POA: Diagnosis not present

## 2024-02-27 DIAGNOSIS — M25662 Stiffness of left knee, not elsewhere classified: Secondary | ICD-10-CM | POA: Diagnosis not present

## 2024-02-27 DIAGNOSIS — M25561 Pain in right knee: Secondary | ICD-10-CM | POA: Diagnosis not present

## 2024-02-27 DIAGNOSIS — M25562 Pain in left knee: Secondary | ICD-10-CM | POA: Diagnosis not present

## 2024-02-27 DIAGNOSIS — R29898 Other symptoms and signs involving the musculoskeletal system: Secondary | ICD-10-CM | POA: Diagnosis not present

## 2024-02-27 DIAGNOSIS — R2689 Other abnormalities of gait and mobility: Secondary | ICD-10-CM | POA: Diagnosis not present

## 2024-02-27 DIAGNOSIS — M25661 Stiffness of right knee, not elsewhere classified: Secondary | ICD-10-CM | POA: Diagnosis not present

## 2024-03-05 DIAGNOSIS — I10 Essential (primary) hypertension: Secondary | ICD-10-CM | POA: Diagnosis not present

## 2024-03-05 DIAGNOSIS — Z299 Encounter for prophylactic measures, unspecified: Secondary | ICD-10-CM | POA: Diagnosis not present

## 2024-03-05 DIAGNOSIS — M25562 Pain in left knee: Secondary | ICD-10-CM | POA: Diagnosis not present

## 2024-03-10 DIAGNOSIS — R29898 Other symptoms and signs involving the musculoskeletal system: Secondary | ICD-10-CM | POA: Diagnosis not present

## 2024-03-10 DIAGNOSIS — M25662 Stiffness of left knee, not elsewhere classified: Secondary | ICD-10-CM | POA: Diagnosis not present

## 2024-03-10 DIAGNOSIS — R2689 Other abnormalities of gait and mobility: Secondary | ICD-10-CM | POA: Diagnosis not present

## 2024-03-10 DIAGNOSIS — M25661 Stiffness of right knee, not elsewhere classified: Secondary | ICD-10-CM | POA: Diagnosis not present

## 2024-03-10 DIAGNOSIS — M25562 Pain in left knee: Secondary | ICD-10-CM | POA: Diagnosis not present

## 2024-03-10 DIAGNOSIS — M25561 Pain in right knee: Secondary | ICD-10-CM | POA: Diagnosis not present

## 2024-03-23 NOTE — Addendum Note (Signed)
 Addended by: Lott Rouleau A on: 03/23/2024 10:28 AM   Modules accepted: Orders

## 2024-03-23 NOTE — Progress Notes (Signed)
 Remote ICD transmission.

## 2024-04-07 ENCOUNTER — Other Ambulatory Visit: Payer: Self-pay

## 2024-04-07 MED ORDER — DAPAGLIFLOZIN PROPANEDIOL 10 MG PO TABS: 10.0000 mg | ORAL_TABLET | Freq: Every day | ORAL | 2 refills | Status: DC

## 2024-04-12 ENCOUNTER — Other Ambulatory Visit: Payer: Self-pay

## 2024-04-12 ENCOUNTER — Telehealth: Payer: Self-pay | Admitting: Cardiology

## 2024-04-12 MED ORDER — APIXABAN 5 MG PO TABS: 5.0000 mg | ORAL_TABLET | Freq: Two times a day (BID) | ORAL | 1 refills | Status: DC

## 2024-04-12 MED ORDER — APIXABAN 5 MG PO TABS: 5.0000 mg | ORAL_TABLET | Freq: Two times a day (BID) | ORAL | 0 refills | Status: DC

## 2024-04-12 NOTE — Telephone Encounter (Signed)
 14 days worth of medication packed and placed in med closet for patient pick up

## 2024-04-12 NOTE — Telephone Encounter (Signed)
 Pt c/o medication issue:  1. Name of Medication:   apixaban  (ELIQUIS ) 5 MG TABS tablet   2. How are you currently taking this medication (dosage and times per day)?   As prescribed  3. Are you having a reaction (difficulty breathing--STAT)?   No  4. What is your medication issue?    Patient called to follow-up on prescription being sent to his insurance company. Patient stated he only has 3-4 days left of this medication and will need some samples.

## 2024-04-12 NOTE — Telephone Encounter (Signed)
 Prescription refill request for Eliquis  received. Indication:afib Last office visit:3/25 Scr:1.2  12/24 Age: 73 Weight:129.7  kg  Prescription refilled

## 2024-04-13 ENCOUNTER — Other Ambulatory Visit: Payer: Self-pay

## 2024-04-13 MED ORDER — APIXABAN 5 MG PO TABS: 5.0000 mg | ORAL_TABLET | Freq: Two times a day (BID) | ORAL | 1 refills | Status: DC

## 2024-04-13 NOTE — Telephone Encounter (Signed)
 Prescription refill request for Eliquis  received. Indication:afib Last office visit:3/25 Scr:1.2  2024 Age: 73 Weight:129.7  kg  Prescription refilled

## 2024-04-20 DIAGNOSIS — Z299 Encounter for prophylactic measures, unspecified: Secondary | ICD-10-CM | POA: Diagnosis not present

## 2024-04-20 DIAGNOSIS — M25551 Pain in right hip: Secondary | ICD-10-CM | POA: Diagnosis not present

## 2024-04-20 DIAGNOSIS — I4891 Unspecified atrial fibrillation: Secondary | ICD-10-CM | POA: Diagnosis not present

## 2024-04-20 DIAGNOSIS — I10 Essential (primary) hypertension: Secondary | ICD-10-CM | POA: Diagnosis not present

## 2024-04-20 DIAGNOSIS — I5022 Chronic systolic (congestive) heart failure: Secondary | ICD-10-CM | POA: Diagnosis not present

## 2024-04-20 DIAGNOSIS — M25561 Pain in right knee: Secondary | ICD-10-CM | POA: Diagnosis not present

## 2024-05-04 ENCOUNTER — Ambulatory Visit (INDEPENDENT_AMBULATORY_CARE_PROVIDER_SITE_OTHER): Payer: Medicare HMO

## 2024-05-04 DIAGNOSIS — I428 Other cardiomyopathies: Secondary | ICD-10-CM | POA: Diagnosis not present

## 2024-05-05 ENCOUNTER — Ambulatory Visit: Admitting: Urology

## 2024-05-05 VITALS — BP 99/66 | HR 86

## 2024-05-05 DIAGNOSIS — N5201 Erectile dysfunction due to arterial insufficiency: Secondary | ICD-10-CM | POA: Diagnosis not present

## 2024-05-05 DIAGNOSIS — N400 Enlarged prostate without lower urinary tract symptoms: Secondary | ICD-10-CM | POA: Diagnosis not present

## 2024-05-05 DIAGNOSIS — R351 Nocturia: Secondary | ICD-10-CM

## 2024-05-05 LAB — URINALYSIS, ROUTINE W REFLEX MICROSCOPIC
Bilirubin, UA: NEGATIVE
Ketones, UA: NEGATIVE
Leukocytes,UA: NEGATIVE
Nitrite, UA: NEGATIVE
Protein,UA: NEGATIVE
RBC, UA: NEGATIVE
Specific Gravity, UA: 1.02 (ref 1.005–1.030)
Urobilinogen, Ur: 0.2 mg/dL (ref 0.2–1.0)
pH, UA: 6 (ref 5.0–7.5)

## 2024-05-05 LAB — CUP PACEART REMOTE DEVICE CHECK
Battery Remaining Longevity: 32 mo
Battery Voltage: 2.94 V
Brady Statistic AP VP Percent: 0.06 %
Brady Statistic AP VS Percent: 95.72 %
Brady Statistic AS VP Percent: 0 %
Brady Statistic AS VS Percent: 4.22 %
Brady Statistic RA Percent Paced: 95.35 %
Brady Statistic RV Percent Paced: 0.07 %
Date Time Interrogation Session: 20250708001804
HighPow Impedance: 73 Ohm
Implantable Lead Connection Status: 753985
Implantable Lead Connection Status: 753985
Implantable Lead Implant Date: 20190411
Implantable Lead Implant Date: 20190411
Implantable Lead Location: 753859
Implantable Lead Location: 753860
Implantable Lead Model: 5076
Implantable Lead Model: 6935
Implantable Pulse Generator Implant Date: 20190411
Lead Channel Impedance Value: 456 Ohm
Lead Channel Impedance Value: 456 Ohm
Lead Channel Impedance Value: 532 Ohm
Lead Channel Pacing Threshold Amplitude: 0.5 V
Lead Channel Pacing Threshold Amplitude: 0.625 V
Lead Channel Pacing Threshold Pulse Width: 0.4 ms
Lead Channel Pacing Threshold Pulse Width: 0.4 ms
Lead Channel Sensing Intrinsic Amplitude: 2.875 mV
Lead Channel Sensing Intrinsic Amplitude: 2.875 mV
Lead Channel Sensing Intrinsic Amplitude: 5.375 mV
Lead Channel Sensing Intrinsic Amplitude: 5.375 mV
Lead Channel Setting Pacing Amplitude: 2 V
Lead Channel Setting Pacing Amplitude: 2.5 V
Lead Channel Setting Pacing Pulse Width: 0.4 ms
Lead Channel Setting Sensing Sensitivity: 0.3 mV
Zone Setting Status: 755011
Zone Setting Status: 755011

## 2024-05-05 LAB — BLADDER SCAN AMB NON-IMAGING: Scan Result: 9

## 2024-05-05 MED ORDER — ALFUZOSIN HCL ER 10 MG PO TB24: 10.0000 mg | ORAL_TABLET | Freq: Two times a day (BID) | ORAL | 11 refills | Status: AC

## 2024-05-05 MED ORDER — TADALAFIL 20 MG PO TABS: 20.0000 mg | ORAL_TABLET | Freq: Every day | ORAL | 5 refills | Status: AC | PRN

## 2024-05-05 MED ORDER — SOLIFENACIN SUCCINATE 10 MG PO TABS: 10.0000 mg | ORAL_TABLET | Freq: Every day | ORAL | 3 refills | Status: AC

## 2024-05-05 NOTE — Progress Notes (Signed)
 Bladder Scan completed today.  Patient can void prior to the bladder scan. Bladder scan result: 9  Performed By: Melvenia Stabs. CMA  Additional notes-

## 2024-05-05 NOTE — Progress Notes (Signed)
 05/05/2024 8:47 AM   Craig Robinson August 26, 1951 [id number]  Referring provider: Maree Isles, MD [address] [place],  [place] [address]  Followup BPH   HPI: Craig Robinson is a 73yo here for followup for BPh with nocturia and erectile dysfunction. IPSS 20 QOL 4 on uroxatral  10mg  at bedtime and vesicare  10mg . Nocturia 4x. Uirne stream fair. No straining to urinate. He tried sildenafil  10mg  with mixed results for his erectile dysfunction.    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   Asthma    Hypertension     Surgical History: Past Surgical History:  Procedure Laterality Date   ICD IMPLANT N/A 02/05/2018   Procedure: ICD IMPLANT;  Surgeon: Waddell Danelle ORN, MD;  Location: [facility] INVASIVE CV LAB;  Service: Cardiovascular;  Laterality: N/A;   LEFT HEART CATH AND CORONARY ANGIOGRAPHY N/A 07/01/2017   Procedure: LEFT HEART CATH AND CORONARY ANGIOGRAPHY;  Surgeon: Dann Candyce RAMAN, MD;  Location: [facility] INVASIVE CV LAB;  Service: Cardiovascular;  Laterality: N/A;    Home Medications:  Allergies as of 05/05/2024   No Known Allergies      Medication List        Accurate as of May 05, 2024  8:47 AM. If you have any questions, ask your nurse or doctor.          alfuzosin  10 MG 24 hr tablet Commonly known as: UROXATRAL  Take 1 tablet (10 mg total) by mouth at bedtime.   amiodarone  200 MG tablet Commonly known as: PACERONE  TAKE 1 TABLET BY MOUTH DAILY  MONDAY THROUGH SATURDAY AND NONE ON SUNDAY   apixaban  5 MG Tabs tablet Commonly known as: ELIQUIS  Take 1 tablet (5 mg total) by mouth 2 (two) times daily.   atorvastatin  40 MG tablet Commonly known as: LIPITOR TAKE 1 TABLET BY MOUTH DAILY   carvedilol  25 MG tablet Commonly known as: COREG  TAKE 1/2 TABLET IN THE MORNING  AND TAKE 1 TABLET IN THE EVENING   dapagliflozin  propanediol 10 MG Tabs tablet Commonly known as: Farxiga  Take 1 tablet (10 mg total) by  mouth daily before breakfast.   Entresto  97-103 MG Generic drug: sacubitril -valsartan  TAKE 1 TABLET BY MOUTH TWICE  DAILY   furosemide  40 MG tablet Commonly known as: LASIX  Take 40 mg by mouth daily as needed.   MISC NATURAL PRODUCTS PO Take 1 tablet by mouth daily. Prostagenics   OVER THE COUNTER MEDICATION Take 2 tablets by mouth at bedtime. Pain Relief Tablets   sildenafil  100 MG tablet Commonly known as: VIAGRA  Take 1 tablet (100 mg total) by mouth as needed.   solifenacin  10 MG tablet Commonly known as: VESICARE  Take 1 tablet (10 mg total) by mouth daily.   spironolactone  25 MG tablet Commonly known as: ALDACTONE  TAKE 1 TABLET BY MOUTH DAILY   tolterodine  4 MG 24 hr capsule Commonly known as: Detrol  LA Take 1 capsule (4 mg total) by mouth daily.   traMADol 50 MG tablet Commonly known as: ULTRAM Take 50 mg by mouth 2 (two) times daily as needed.        Allergies: No Known Allergies  Family History: Family History  Problem Relation Age of Onset   Alzheimer's disease Mother    Cancer Father     Social History:  reports that he has never smoked. He has never used smokeless tobacco. He reports that he does not drink alcohol  and does not use drugs.  ROS: All other review of systems  were reviewed and are negative except what is noted above in HPI  Physical Exam: BP 99/66   Pulse 86   Constitutional:  Alert and oriented, No acute distress. HEENT: [place] AT, moist mucus membranes.  Trachea midline, no masses. Cardiovascular: No clubbing, cyanosis, or edema. Respiratory: Normal respiratory effort, no increased work of breathing. GI: Abdomen is soft, nontender, nondistended, no abdominal masses GU: No CVA tenderness.  Lymph: No cervical or inguinal lymphadenopathy. Skin: No rashes, bruises or suspicious lesions. Neurologic: Grossly intact, no focal deficits, moving all 4 extremities. Psychiatric: Normal mood and affect.  Laboratory Data: Lab Results   Component Value Date   WBC 4.6 07/26/2021   HGB 12.8 (L) 07/26/2021   HCT 39.3 07/26/2021   MCV 105.4 (H) 07/26/2021   PLT 192 07/26/2021    Lab Results  Component Value Date   CREATININE 1.34 (H) 08/24/2021    No results found for: PSA  No results found for: TESTOSTERONE  Lab Results  Component Value Date   HGBA1C 5.4 07/01/2017    Urinalysis    Component Value Date/Time   APPEARANCEUR Clear 01/30/2024 0838   GLUCOSEU 3+ (A) 01/30/2024 0838   BILIRUBINUR Negative 01/30/2024 0838   PROTEINUR Negative 01/30/2024 0838   NITRITE Negative 01/30/2024 0838   LEUKOCYTESUR Negative 01/30/2024 0838    Lab Results  Component Value Date   LABMICR Comment 01/30/2024   WBCUA 0-5 08/28/2022   LABEPIT 0-10 08/28/2022   BACTERIA Few (A) 08/28/2022    Pertinent Imaging:  No results found for this or any previous visit.  No results found for this or any previous visit.  No results found for this or any previous visit.  No results found for this or any previous visit.  No results found for this or any previous visit.  No results found for this or any previous visit.  No results found for this or any previous visit.  No results found for this or any previous visit.   Assessment & Plan:    1. Benign prostatic hyperplasia, unspecified whether lower urinary tract symptoms present (Primary) Increase uroxatral  to 10mg  BID - BLADDER SCAN AMB NON-IMAGING - Urinalysis, Routine w reflex microscopic  2. Nocturia -vesicare  10mg  daily - BLADDER SCAN AMB NON-IMAGING  3. Erectile dysfunction due to arterial insufficiency -tadalafil  20mg  prn   No follow-ups on file.  Craig Clara, MD  [facility] [place]

## 2024-05-06 ENCOUNTER — Ambulatory Visit: Payer: Self-pay | Admitting: Internal Medicine

## 2024-05-09 ENCOUNTER — Other Ambulatory Visit: Payer: Self-pay | Admitting: Internal Medicine

## 2024-05-11 ENCOUNTER — Encounter: Payer: Self-pay | Admitting: Urology

## 2024-05-11 NOTE — Patient Instructions (Signed)

## 2024-05-19 DIAGNOSIS — M25561 Pain in right knee: Secondary | ICD-10-CM | POA: Diagnosis not present

## 2024-05-19 DIAGNOSIS — Z299 Encounter for prophylactic measures, unspecified: Secondary | ICD-10-CM | POA: Diagnosis not present

## 2024-05-19 DIAGNOSIS — M25551 Pain in right hip: Secondary | ICD-10-CM | POA: Diagnosis not present

## 2024-05-19 DIAGNOSIS — E860 Dehydration: Secondary | ICD-10-CM | POA: Diagnosis not present

## 2024-06-02 DIAGNOSIS — I10 Essential (primary) hypertension: Secondary | ICD-10-CM | POA: Diagnosis not present

## 2024-06-02 DIAGNOSIS — Z299 Encounter for prophylactic measures, unspecified: Secondary | ICD-10-CM | POA: Diagnosis not present

## 2024-06-02 DIAGNOSIS — R42 Dizziness and giddiness: Secondary | ICD-10-CM | POA: Diagnosis not present

## 2024-06-02 DIAGNOSIS — I5022 Chronic systolic (congestive) heart failure: Secondary | ICD-10-CM | POA: Diagnosis not present

## 2024-06-02 DIAGNOSIS — H6500 Acute serous otitis media, unspecified ear: Secondary | ICD-10-CM | POA: Diagnosis not present

## 2024-07-24 ENCOUNTER — Other Ambulatory Visit: Payer: Self-pay | Admitting: Internal Medicine

## 2024-07-24 ENCOUNTER — Other Ambulatory Visit: Payer: Self-pay | Admitting: Cardiology

## 2024-07-26 ENCOUNTER — Ambulatory Visit: Attending: Cardiology | Admitting: Cardiology

## 2024-07-26 ENCOUNTER — Encounter: Payer: Self-pay | Admitting: Cardiology

## 2024-07-26 VITALS — BP 108/62 | HR 88 | Ht 73.0 in | Wt 272.0 lb

## 2024-07-26 DIAGNOSIS — E782 Mixed hyperlipidemia: Secondary | ICD-10-CM | POA: Diagnosis not present

## 2024-07-26 DIAGNOSIS — I4729 Other ventricular tachycardia: Secondary | ICD-10-CM | POA: Diagnosis not present

## 2024-07-26 DIAGNOSIS — I480 Paroxysmal atrial fibrillation: Secondary | ICD-10-CM | POA: Diagnosis not present

## 2024-07-26 DIAGNOSIS — D6869 Other thrombophilia: Secondary | ICD-10-CM | POA: Diagnosis not present

## 2024-07-26 DIAGNOSIS — I5022 Chronic systolic (congestive) heart failure: Secondary | ICD-10-CM

## 2024-07-26 MED ORDER — FUROSEMIDE 40 MG PO TABS: 40.0000 mg | ORAL_TABLET | Freq: Every day | ORAL | 2 refills | Status: AC | PRN

## 2024-07-26 NOTE — Patient Instructions (Addendum)
 Medication Instructions:  Continue all current medications.   Labwork: none  Testing/Procedures: none  Follow-Up: 6 months   Any Other Special Instructions Will Be Listed Below (If Applicable).   If you need a refill on your cardiac medications before your next appointment, please call your pharmacy.

## 2024-07-26 NOTE — Progress Notes (Signed)
 Clinical Summary Craig Robinson is a 73 y.o.male seen for follow up of the following medical problems.      1. Chronic systolic heart failure - 06/2017 echo LVEF 25-30%, diffuse hypokinesis, grade I diastolic dysfunction - 06/2017 cath without obstructive CAD - 11/2017 LVEF 20-25% - AICD followed by Dr Waddell   01/2021 echo LVEF 30-35%.    - denies SOB/DOE, no recent edema - compliant with meds. Has prn lasix  but has not needed       2. NSVT/VT - AICD followed by EP, has been on amio as well - denies palpitations - 04/2024 normal device check     3. PAF  - no palpitations - no bleeding on eliquis   4. Hyperlipidemia  09/2022 TC 102 TG 35 HDL 54 LDL 38 - 09/2023 TC 104 TG 34 HDL 53 LDL 41   5.HTN -he is compliant with meds   Past Medical History:  Diagnosis Date   AICD (automatic cardioverter/defibrillator) present 02/05/2018   Asthma    Hypertension      No Known Allergies   Current Outpatient Medications  Medication Sig Dispense Refill   alfuzosin  (UROXATRAL ) 10 MG 24 hr tablet Take 1 tablet (10 mg total) by mouth 2 (two) times daily. 60 tablet 11   amiodarone  (PACERONE ) 200 MG tablet TAKE 1 TABLET BY MOUTH DAILY  MONDAY THROUGH SATURDAY AND NONE ON SUNDAY 72 tablet 3   apixaban  (ELIQUIS ) 5 MG TABS tablet Take 1 tablet (5 mg total) by mouth 2 (two) times daily. 180 tablet 1   atorvastatin  (LIPITOR) 40 MG tablet TAKE 1 TABLET BY MOUTH DAILY 100 tablet 1   carvedilol  (COREG ) 25 MG tablet TAKE 1/2 TABLET IN THE MORNING  AND TAKE 1 TABLET IN THE EVENING 135 tablet 2   dapagliflozin  propanediol (FARXIGA ) 10 MG TABS tablet Take 1 tablet (10 mg total) by mouth daily before breakfast. 90 tablet 2   furosemide  (LASIX ) 40 MG tablet Take 40 mg by mouth daily as needed.     OVER THE COUNTER MEDICATION Take 2 tablets by mouth at bedtime. Pain Relief Tablets     sacubitril -valsartan  (ENTRESTO ) 97-103 MG TAKE 1 TABLET BY MOUTH TWICE  DAILY 200 tablet 1   sildenafil   (VIAGRA ) 100 MG tablet Take 1 tablet (100 mg total) by mouth as needed. 10 tablet 5   solifenacin  (VESICARE ) 10 MG tablet Take 1 tablet (10 mg total) by mouth daily. 90 tablet 3   spironolactone  (ALDACTONE ) 25 MG tablet TAKE 1 TABLET BY MOUTH DAILY 100 tablet 1   tadalafil  (CIALIS ) 20 MG tablet Take 1 tablet (20 mg total) by mouth daily as needed. 10 tablet 5   traMADol (ULTRAM) 50 MG tablet Take 50 mg by mouth 2 (two) times daily as needed.     No current facility-administered medications for this visit.     Past Surgical History:  Procedure Laterality Date   ICD IMPLANT N/A 02/05/2018   Procedure: ICD IMPLANT;  Surgeon: Waddell Danelle ORN, MD;  Location: [facility] INVASIVE CV LAB;  Service: Cardiovascular;  Laterality: N/A;   LEFT HEART CATH AND CORONARY ANGIOGRAPHY N/A 07/01/2017   Procedure: LEFT HEART CATH AND CORONARY ANGIOGRAPHY;  Surgeon: Dann Candyce RAMAN, MD;  Location: [facility] INVASIVE CV LAB;  Service: Cardiovascular;  Laterality: N/A;     No Known Allergies    Family History  Problem Relation Age of Onset   Alzheimer's disease Mother    Cancer Father      Social  History Mr. Portnoy reports that he has never smoked. He has never used smokeless tobacco. Mr. Gordillo reports no history of alcohol  use.    Physical Examination Today's Vitals   07/26/24 1154  BP: 108/62  Pulse: 88  SpO2: 98%  Weight: 272 lb (123.4 kg)  Height: 6' 1 (1.854 m)   Body mass index is 35.89 kg/m.  Gen: resting comfortably, no acute distress HEENT: no scleral icterus, pupils equal round and reactive, no palptable cervical adenopathy,  CV: RRR, no m/rg, no jvd Resp: Clear to auscultation bilaterally GI: abdomen is soft, non-tender, non-distended, normal bowel sounds, no hepatosplenomegaly MSK: extremities are warm, no edema.  Skin: warm, no rash Neuro:  no focal deficits Psych: appropriate affect   Diagnostic Studies  01/2021 echo 1. Left ventricular ejection fraction, by estimation, is  30 to 35%. The  left ventricle has moderately decreased function. The left ventricle  demonstrates global hypokinesis. The left ventricular internal cavity size  was mildly dilated. Left ventricular  diastolic parameters are consistent with Grade I diastolic dysfunction  (impaired relaxation). The average left ventricular global longitudinal  strain is 10.2 %. The global longitudinal strain is abnormal.   2. Right ventricular systolic function was not well visualized. The right  ventricular size is not well visualized.   3. The mitral valve was not well visualized. No evidence of mitral valve  regurgitation. No evidence of mitral stenosis.   4. The aortic valve has an indeterminant number of cusps. There is mild  calcification of the aortic valve. There is mild thickening of the aortic  valve. Aortic valve regurgitation is not visualized. No aortic stenosis is  present.   5. The inferior vena cava is normal in size with greater than 50%  respiratory variability, suggesting right atrial pressure of 3 mmHg.        Assessment and Plan   1. Chronic systolic HF - doing well without symptoms, continue current meds   2. NSVT/VT - has ICD in place, started on amiodarone  by EP - most recent device check was overall normal - continue current therapy.      3. Afib/acquired thrombophilia - no symptoms, continue current meds including eliquis  for stroke prevention.    4. Hyperlipidemia Lipids at goal, continue current meds     Dorn PHEBE Ross, M.D.

## 2024-08-03 ENCOUNTER — Ambulatory Visit: Payer: Medicare HMO

## 2024-08-03 DIAGNOSIS — I5022 Chronic systolic (congestive) heart failure: Secondary | ICD-10-CM

## 2024-08-04 LAB — CUP PACEART REMOTE DEVICE CHECK
Battery Remaining Longevity: 29 mo
Battery Voltage: 2.94 V
Brady Statistic AP VP Percent: 0.06 %
Brady Statistic AP VS Percent: 95.78 %
Brady Statistic AS VP Percent: 0 %
Brady Statistic AS VS Percent: 4.16 %
Brady Statistic RA Percent Paced: 95.45 %
Brady Statistic RV Percent Paced: 0.07 %
Date Time Interrogation Session: 20251007044223
HighPow Impedance: 61 Ohm
Implantable Lead Connection Status: 753985
Implantable Lead Connection Status: 753985
Implantable Lead Implant Date: 20190411
Implantable Lead Implant Date: 20190411
Implantable Lead Location: 753859
Implantable Lead Location: 753860
Implantable Lead Model: 5076
Implantable Lead Model: 6935
Implantable Pulse Generator Implant Date: 20190411
Lead Channel Impedance Value: 342 Ohm
Lead Channel Impedance Value: 418 Ohm
Lead Channel Impedance Value: 418 Ohm
Lead Channel Pacing Threshold Amplitude: 0.625 V
Lead Channel Pacing Threshold Amplitude: 0.875 V
Lead Channel Pacing Threshold Pulse Width: 0.4 ms
Lead Channel Pacing Threshold Pulse Width: 0.4 ms
Lead Channel Sensing Intrinsic Amplitude: 1.875 mV
Lead Channel Sensing Intrinsic Amplitude: 1.875 mV
Lead Channel Sensing Intrinsic Amplitude: 5.375 mV
Lead Channel Sensing Intrinsic Amplitude: 5.375 mV
Lead Channel Setting Pacing Amplitude: 2 V
Lead Channel Setting Pacing Amplitude: 2.5 V
Lead Channel Setting Pacing Pulse Width: 0.4 ms
Lead Channel Setting Sensing Sensitivity: 0.3 mV
Zone Setting Status: 755011
Zone Setting Status: 755011

## 2024-08-05 ENCOUNTER — Ambulatory Visit: Payer: Self-pay | Admitting: Internal Medicine

## 2024-08-05 ENCOUNTER — Telehealth: Payer: Self-pay

## 2024-08-05 NOTE — Telephone Encounter (Signed)
 Spoke with patient.  He denies any signs of edema or SOB with exertion, states he is doing well   Has not been taking his Lasix  - just got a new r/x yesterday (only takes PRN).   I suggested he take daily for next 2-3 days, continue to limit his sodium intake and let us  know if he develops any new symptoms.   Forwarding to Dr. Alvan as an RICK. Patient last seen for HF on 06/30/24.

## 2024-08-05 NOTE — Telephone Encounter (Signed)
 ICD scheduled remote reviewed. Normal device function.  Presenting rhythm: AP-VS Some VF/FVT/VT therapies have 1 or more AX>B pathways programmed. AX>B pathway is not recommended for this device.  HF diagnostics abnormal during this monitoring period   LM to call back and assess symptoms.

## 2024-08-06 DIAGNOSIS — I10 Essential (primary) hypertension: Secondary | ICD-10-CM | POA: Diagnosis not present

## 2024-08-06 DIAGNOSIS — Z299 Encounter for prophylactic measures, unspecified: Secondary | ICD-10-CM | POA: Diagnosis not present

## 2024-08-06 DIAGNOSIS — M25562 Pain in left knee: Secondary | ICD-10-CM | POA: Diagnosis not present

## 2024-08-06 NOTE — Progress Notes (Signed)
 Remote ICD Transmission

## 2024-08-17 DIAGNOSIS — Z1389 Encounter for screening for other disorder: Secondary | ICD-10-CM | POA: Diagnosis not present

## 2024-08-17 DIAGNOSIS — Z299 Encounter for prophylactic measures, unspecified: Secondary | ICD-10-CM | POA: Diagnosis not present

## 2024-08-17 DIAGNOSIS — I4891 Unspecified atrial fibrillation: Secondary | ICD-10-CM | POA: Diagnosis not present

## 2024-08-17 DIAGNOSIS — Z7189 Other specified counseling: Secondary | ICD-10-CM | POA: Diagnosis not present

## 2024-08-17 DIAGNOSIS — I255 Ischemic cardiomyopathy: Secondary | ICD-10-CM | POA: Diagnosis not present

## 2024-08-17 DIAGNOSIS — I10 Essential (primary) hypertension: Secondary | ICD-10-CM | POA: Diagnosis not present

## 2024-08-17 DIAGNOSIS — Z0000 Encounter for general adult medical examination without abnormal findings: Secondary | ICD-10-CM | POA: Diagnosis not present

## 2024-09-11 ENCOUNTER — Other Ambulatory Visit: Payer: Self-pay | Admitting: Cardiology

## 2024-09-15 NOTE — Telephone Encounter (Signed)
 Pt looking for eliquis 

## 2024-09-15 NOTE — Telephone Encounter (Signed)
 Prescription refill request for Eliquis  received. Indication: a-fib Last office visit: 07/22/2024 Scr: 1.2 ([facility] DXA 10/27/2023) Age: 73 Weight: 123.4kg  Refill sent

## 2024-10-08 ENCOUNTER — Ambulatory Visit: Attending: Internal Medicine | Admitting: Internal Medicine

## 2024-10-08 ENCOUNTER — Encounter: Payer: Self-pay | Admitting: Internal Medicine

## 2024-10-08 VITALS — BP 122/84 | HR 64 | Ht 72.0 in | Wt 258.0 lb

## 2024-10-08 DIAGNOSIS — I480 Paroxysmal atrial fibrillation: Secondary | ICD-10-CM | POA: Diagnosis not present

## 2024-10-08 DIAGNOSIS — I5022 Chronic systolic (congestive) heart failure: Secondary | ICD-10-CM

## 2024-10-08 LAB — CUP PACEART INCLINIC DEVICE CHECK
Battery Remaining Longevity: 28 mo
Battery Voltage: 2.94 V
Brady Statistic AP VP Percent: 0.07 %
Brady Statistic AP VS Percent: 95.38 %
Brady Statistic AS VP Percent: 0 %
Brady Statistic AS VS Percent: 4.55 %
Brady Statistic RA Percent Paced: 94.83 %
Brady Statistic RV Percent Paced: 0.08 %
Date Time Interrogation Session: 20251212092435
HighPow Impedance: 64 Ohm
Implantable Lead Connection Status: 753985
Implantable Lead Connection Status: 753985
Implantable Lead Implant Date: 20190411
Implantable Lead Implant Date: 20190411
Implantable Lead Location: 753859
Implantable Lead Location: 753860
Implantable Lead Model: 5076
Implantable Lead Model: 6935
Implantable Pulse Generator Implant Date: 20190411
Lead Channel Impedance Value: 399 Ohm
Lead Channel Impedance Value: 456 Ohm
Lead Channel Impedance Value: 513 Ohm
Lead Channel Pacing Threshold Amplitude: 0.5 V
Lead Channel Pacing Threshold Amplitude: 0.875 V
Lead Channel Pacing Threshold Pulse Width: 0.4 ms
Lead Channel Pacing Threshold Pulse Width: 0.4 ms
Lead Channel Sensing Intrinsic Amplitude: 2.25 mV
Lead Channel Sensing Intrinsic Amplitude: 2.625 mV
Lead Channel Sensing Intrinsic Amplitude: 6.125 mV
Lead Channel Sensing Intrinsic Amplitude: 8 mV
Lead Channel Setting Pacing Amplitude: 2 V
Lead Channel Setting Pacing Amplitude: 2.5 V
Lead Channel Setting Pacing Pulse Width: 0.4 ms
Lead Channel Setting Sensing Sensitivity: 0.3 mV
Zone Setting Status: 755011
Zone Setting Status: 755011

## 2024-10-08 NOTE — Progress Notes (Signed)
 HPI [name] returns today for followup.  He is a very pleasant 73 year old man with a history of chronic systolic heart failure, ventricular tachycardia, status post ICD insertion, obesity, and hypertension.  In the interim, he has had minimal dyspnea.  He admits to dietary indiscretion with sodium.  He has lost weight since his last visit, almost 20 lbs.  He has no significant peripheral edema.  He denies anginal symptoms.  He denies ICD therapies. He has some tingling in his hands, non-exertional.  Allergies[1]   Current Outpatient Medications  Medication Sig Dispense Refill   alfuzosin  (UROXATRAL ) 10 MG 24 hr tablet Take 1 tablet (10 mg total) by mouth 2 (two) times daily. 60 tablet 11   amiodarone  (PACERONE ) 200 MG tablet TAKE 1 TABLET BY MOUTH DAILY  MONDAY THROUGH SATURDAY AND NONE ON SUNDAY 72 tablet 3   apixaban  (ELIQUIS ) 5 MG TABS tablet TAKE 1 TABLET BY MOUTH TWICE  DAILY 180 tablet 1   atorvastatin  (LIPITOR) 40 MG tablet TAKE 1 TABLET BY MOUTH DAILY 100 tablet 3   azelastine (ASTELIN) 0.1 % nasal spray Place 1 spray into both nostrils 2 (two) times daily.     carvedilol  (COREG ) 25 MG tablet TAKE ONE-HALF TABLET BY MOUTH IN THE MORNING AND 1 TABLET IN THE  EVENING 135 tablet 3   dapagliflozin  propanediol (FARXIGA ) 10 MG TABS tablet TAKE 1 TABLET BY MOUTH DAILY  BEFORE BREAKFAST 90 tablet 3   furosemide  (LASIX ) 40 MG tablet Take 1 tablet (40 mg total) by mouth daily as needed. 90 tablet 2   OVER THE COUNTER MEDICATION Take 2 tablets by mouth at bedtime. Pain Relief Tablets     sacubitril -valsartan  (ENTRESTO ) 97-103 MG TAKE 1 TABLET BY MOUTH TWICE  DAILY 200 tablet 1   solifenacin  (VESICARE ) 10 MG tablet Take 1 tablet (10 mg total) by mouth daily. 90 tablet 3   spironolactone  (ALDACTONE ) 25 MG tablet TAKE 1 TABLET BY MOUTH DAILY 100 tablet 3   traMADol (ULTRAM) 50 MG tablet Take 50 mg by mouth 2 (two) times daily as needed.     tadalafil  (CIALIS ) 20 MG tablet Take 1 tablet  (20 mg total) by mouth daily as needed. (Patient not taking: Reported on 10/08/2024) 10 tablet 5   No current facility-administered medications for this visit.     Past Medical History:  Diagnosis Date   AICD (automatic cardioverter/defibrillator) present 02/05/2018   Asthma    Hypertension     ROS:   All systems reviewed and negative except as noted in the HPI.   Past Surgical History:  Procedure Laterality Date   ICD IMPLANT N/A 02/05/2018   Procedure: ICD IMPLANT;  Surgeon: Waddell Danelle ORN, MD;  Location: [facility] INVASIVE CV LAB;  Service: Cardiovascular;  Laterality: N/A;   LEFT HEART CATH AND CORONARY ANGIOGRAPHY N/A 07/01/2017   Procedure: LEFT HEART CATH AND CORONARY ANGIOGRAPHY;  Surgeon: Dann Candyce RAMAN, MD;  Location: [facility] INVASIVE CV LAB;  Service: Cardiovascular;  Laterality: N/A;     Family History  Problem Relation Age of Onset   Alzheimer's disease Mother    Cancer Father      Social History   Socioeconomic History   Marital status: Single    Spouse name: Not on file   Number of children: Not on file   Years of education: Not on file   Highest education level: Not on file  Occupational History   Not on file  Tobacco Use   Smoking  status: Never   Smokeless tobacco: Never  Vaping Use   Vaping status: Never Used  Substance and Sexual Activity   Alcohol  use: No   Drug use: No   Sexual activity: Not Currently    Partners: Female  Other Topics Concern   Not on file  Social History Narrative   Not on file   Social Drivers of Health   Tobacco Use: Low Risk (10/08/2024)   Patient History    Smoking Tobacco Use: Never    Smokeless Tobacco Use: Never    Passive Exposure: Not on file  Financial Resource Strain: Not on file  Food Insecurity: Not on file  Transportation Needs: Not on file  Physical Activity: Not on file  Stress: Not on file  Social Connections: Not on file  Intimate Partner Violence: Not on file  Depression (EYV7-0): Not on file   Alcohol  Screen: Not on file  Housing: Not on file  Utilities: Not on file  Health Literacy: Not on file     BP 122/84 (BP Location: Right Arm, Cuff Size: Large)   Pulse 64   Ht 6' (1.829 m)   Wt 258 lb (117 kg)   SpO2 100%   BMI 34.99 kg/m   Physical Exam:  Well appearing 73 yo man, NAD HEENT: Unremarkable Neck:  No JVD, no thyromegally Lymphatics:  No adenopathy Back:  No CVA tenderness Lungs:  Clear with no wheezes HEART:  Regular rate rhythm, no murmurs, no rubs, no clicks Abd:  soft, positive bowel sounds, no organomegally, no rebound, no guarding Ext:  2 plus pulses, no edema, no cyanosis, no clubbing Skin:  No rashes no nodules Neuro:  CN II through XII intact, motor grossly intact  EKG - nsr with atrial pacing  DEVICE  Normal device function.  See PaceArt for details.   Assess/Plan:   1.Ventricular tachycardia -he has had no ICD shocks. No ATP since his last visit. He will continue the amio to 6 days a week, none on Sunday. 2.  Obesity.  I have encouraged the patient to continue to lose weight. Fortunately he has lost over 20 lbs since his last visit. 3.  Chronic systolic heart failure -the patient's symptoms are class II.  His fluid index is good today.   4.  ICD -his Medtronic dual-chamber ICD is working normally.  We will recheck in several months. over 2 years of battery longevity.   Danelle Jenay Morici,MD    [1] No Known Allergies

## 2024-10-08 NOTE — Patient Instructions (Signed)
 Medication Instructions:  Your physician recommends that you continue on your current medications as directed. Please refer to the Current Medication list given to you today.   Labwork: None today  Testing/Procedures: None today  Follow-Up: 1 year Dr.Carlisle  Any Other Special Instructions Will Be Listed Below (If Applicable).  If you need a refill on your cardiac medications before your next appointment, please call your pharmacy.

## 2024-10-24 ENCOUNTER — Encounter (HOSPITAL_COMMUNITY): Payer: Self-pay

## 2024-10-24 ENCOUNTER — Emergency Department (HOSPITAL_COMMUNITY)

## 2024-10-24 ENCOUNTER — Other Ambulatory Visit: Payer: Self-pay

## 2024-10-24 ENCOUNTER — Emergency Department (HOSPITAL_COMMUNITY)
Admission: EM | Admit: 2024-10-24 | Discharge: 2024-10-24 | Disposition: A | Discharge status: Home / Self Care | Attending: Emergency Medicine | Admitting: Emergency Medicine

## 2024-10-24 DIAGNOSIS — K5900 Constipation, unspecified: Secondary | ICD-10-CM | POA: Insufficient documentation

## 2024-10-24 DIAGNOSIS — Z7901 Long term (current) use of anticoagulants: Secondary | ICD-10-CM | POA: Diagnosis not present

## 2024-10-24 DIAGNOSIS — R1084 Generalized abdominal pain: Secondary | ICD-10-CM | POA: Diagnosis present

## 2024-10-24 DIAGNOSIS — J45909 Unspecified asthma, uncomplicated: Secondary | ICD-10-CM | POA: Diagnosis not present

## 2024-10-24 DIAGNOSIS — I509 Heart failure, unspecified: Secondary | ICD-10-CM | POA: Insufficient documentation

## 2024-10-24 DIAGNOSIS — I110 Hypertensive heart disease with heart failure: Secondary | ICD-10-CM | POA: Diagnosis not present

## 2024-10-24 DIAGNOSIS — R338 Other retention of urine: Secondary | ICD-10-CM | POA: Diagnosis not present

## 2024-10-24 DIAGNOSIS — Z79899 Other long term (current) drug therapy: Secondary | ICD-10-CM | POA: Diagnosis not present

## 2024-10-24 DIAGNOSIS — R109 Unspecified abdominal pain: Secondary | ICD-10-CM

## 2024-10-24 LAB — URINALYSIS, ROUTINE W REFLEX MICROSCOPIC
Bilirubin Urine: NEGATIVE
Glucose, UA: >=500 mg/dL — AB
Hgb urine dipstick: NEGATIVE
Ketones, ur: NEGATIVE mg/dL
Leukocytes,Ua: NEGATIVE
Nitrite: NEGATIVE
Protein, ur: NEGATIVE mg/dL
Specific Gravity, Urine: 1.024 (ref 1.005–1.030)
pH: 5 (ref 5.0–8.0)

## 2024-10-24 LAB — COMPREHENSIVE METABOLIC PANEL WITH GFR
ALT: 9 U/L (ref 0–44)
AST: 17 U/L (ref 15–41)
Albumin: 4.4 g/dL (ref 3.5–5.0)
Alkaline Phosphatase: 83 U/L (ref 38–126)
Anion gap: 14 (ref 5–15)
BUN: 21 mg/dL (ref 8–23)
CO2: 21 mmol/L — ABNORMAL LOW (ref 22–32)
Calcium: 9.5 mg/dL (ref 8.9–10.3)
Chloride: 103 mmol/L (ref 98–111)
Creatinine, Ser: 1.13 mg/dL (ref 0.61–1.24)
GFR, Estimated: >60 mL/min
Glucose, Bld: 128 mg/dL — ABNORMAL HIGH (ref 70–99)
Potassium: 4.4 mmol/L (ref 3.5–5.1)
Sodium: 139 mmol/L (ref 135–145)
Total Bilirubin: 0.7 mg/dL (ref 0.0–1.2)
Total Protein: 7.2 g/dL (ref 6.5–8.1)

## 2024-10-24 LAB — CBC WITH DIFFERENTIAL/PLATELET
Abs Immature Granulocytes: 0.01 K/uL (ref 0.00–0.07)
Basophils Absolute: 0 K/uL (ref 0.0–0.1)
Basophils Relative: 0 %
Eosinophils Absolute: 0 K/uL (ref 0.0–0.5)
Eosinophils Relative: 0 %
HCT: 38.7 % — ABNORMAL LOW (ref 39.0–52.0)
Hemoglobin: 12.9 g/dL — ABNORMAL LOW (ref 13.0–17.0)
Immature Granulocytes: 0 %
Lymphocytes Relative: 15 %
Lymphs Abs: 1.2 K/uL (ref 0.7–4.0)
MCH: 32.8 pg (ref 26.0–34.0)
MCHC: 33.3 g/dL (ref 30.0–36.0)
MCV: 98.5 fL (ref 80.0–100.0)
Monocytes Absolute: 0.5 K/uL (ref 0.1–1.0)
Monocytes Relative: 6 %
Neutro Abs: 6.3 K/uL (ref 1.7–7.7)
Neutrophils Relative %: 79 %
Platelets: 194 K/uL (ref 150–400)
RBC: 3.93 MIL/uL — ABNORMAL LOW (ref 4.22–5.81)
RDW: 12.5 % (ref 11.5–15.5)
WBC: 8 K/uL (ref 4.0–10.5)
nRBC: 0 % (ref 0.0–0.2)

## 2024-10-24 LAB — POC OCCULT BLOOD, ED: Fecal Occult Blood: POSITIVE — AB

## 2024-10-24 LAB — LIPASE, BLOOD: Lipase: 13 U/L (ref 11–51)

## 2024-10-24 MED ORDER — POLYETHYLENE GLYCOL 3350 17 GM/SCOOP PO POWD: 17.0000 g | Freq: Every day | ORAL | 0 refills | Status: AC

## 2024-10-24 MED ORDER — IOHEXOL 350 MG/ML SOLN
100.0000 mL | Freq: Once | INTRAVENOUS | Status: AC | PRN
  Administered 2024-10-24: 100 mL via INTRAVENOUS

## 2024-10-24 NOTE — Discharge Instructions (Signed)
 Thank you for visiting the Emergency Department today. It was a pleasure to be part of your healthcare team.   Your were seen today for constipation, and your test results showed that your prostate is mildly enlarged and you had some moderate urinary retention requiring a Foley catheter.  As discussed, please keep the Foley catheter in place until you are able to be seen by urology.  Please take MiraLAX  as prescribed for constipation.  Please avoid taking any castor oil as this may cause GI upset. You should take your medications as directed. If you have any questions about your medicines, please call your pharmacy or healthcare provider.  It is important to watch for warning signs such as worsening pain, fever, increase in bleeding, or any new or worsening symptoms. If any of these happen, return to the Emergency Department or call 911.  Thank you for trusting us  with your health.

## 2024-10-24 NOTE — ED Triage Notes (Signed)
 Pt arrives to triage in wheelchair with family. Pt reports constipation X1-2 days states that when he tried to have a BM he notices bright red blood. Also reports issues with sciatic nerve pain in right hip but reports some numbness in abdomen over a week, also numbness in left hand over a week ago, some numbness in right hand as well.  Pt also reports some urinary retention starting last night.

## 2024-10-24 NOTE — ED Notes (Signed)
 Patient transported to CT

## 2024-10-24 NOTE — ED Provider Notes (Signed)
 " [place] EMERGENCY DEPARTMENT AT [facility] Provider Note   CSN: [id number] Arrival date & time: 10/24/24  1318     Patient presents with: Constipation   Craig Robinson is a 73 y.o. male Past medical history of NSTEMI, atrial fibrillation, hypertension, heart failure who presents with abdominal pain ongoing for 2 days.  The pain is described as generalized, wax/wane cramping with no clear radiation.  The patient reports associated symptoms including constipation for which he took some castor oil by mouth yesterday with several bowel movements today.  Patient also notes that he has had some mild urinary retention and feels like he has to force when he urinates.  Patient has a history of BPH and has had issues with urinary retention in the past.  Patient states that with his bowel movements today he has had some mild hematochezia when he wipes with hard stool. He denies fever, chills, chest pain, shortness of breath, persistent vomiting, hematemesis, melena, dysuria, hematuria.  There is no history of recent abdominal trauma or prior abdominal surgery. No alleviating or exacerbating factors were clearly identified.  The patient is in no acute distress.    Constipation      Prior to Admission medications  Medication Sig Start Date End Date Taking? Authorizing Provider  polyethylene glycol powder (GLYCOLAX /MIRALAX ) 17 GM/SCOOP powder Take 17 g by mouth daily. Dissolve 1 capful (17g) in 4-8 ounces of liquid and take by mouth daily. 10/24/24  Yes Mykayla Brinton L, PA  alfuzosin  (UROXATRAL ) 10 MG 24 hr tablet Take 1 tablet (10 mg total) by mouth 2 (two) times daily. 05/05/24   McKenzie, Belvie CROME, MD  amiodarone  (PACERONE ) 200 MG tablet TAKE 1 TABLET BY MOUTH DAILY  MONDAY THROUGH SATURDAY AND NONE ON SUNDAY 02/19/24   Waddell Danelle ORN, MD  apixaban  (ELIQUIS ) 5 MG TABS tablet TAKE 1 TABLET BY MOUTH TWICE  DAILY 09/15/24   Alvan Dorn FALCON, MD  atorvastatin  (LIPITOR) 40 MG tablet  TAKE 1 TABLET BY MOUTH DAILY 07/27/24   Branch, Jonathan F, MD  azelastine (ASTELIN) 0.1 % nasal spray Place 1 spray into both nostrils 2 (two) times daily. 06/03/24   [provider]  carvedilol  (COREG ) 25 MG tablet TAKE ONE-HALF TABLET BY MOUTH IN THE MORNING AND 1 TABLET IN THE  EVENING 07/27/24   Alvan Dorn FALCON, MD  dapagliflozin  propanediol (FARXIGA ) 10 MG TABS tablet TAKE 1 TABLET BY MOUTH DAILY  BEFORE BREAKFAST 09/15/24   Alvan Dorn FALCON, MD  furosemide  (LASIX ) 40 MG tablet Take 1 tablet (40 mg total) by mouth daily as needed. 07/26/24   Alvan Dorn FALCON, MD  OVER THE COUNTER MEDICATION Take 2 tablets by mouth at bedtime. Pain Relief Tablets    [provider]  sacubitril -valsartan  (ENTRESTO ) 97-103 MG TAKE 1 TABLET BY MOUTH TWICE  DAILY 02/05/24   Alvan Dorn FALCON, MD  solifenacin  (VESICARE ) 10 MG tablet Take 1 tablet (10 mg total) by mouth daily. 05/05/24   McKenzie, Belvie CROME, MD  spironolactone  (ALDACTONE ) 25 MG tablet TAKE 1 TABLET BY MOUTH DAILY 07/27/24   Alvan Dorn FALCON, MD  tadalafil  (CIALIS ) 20 MG tablet Take 1 tablet (20 mg total) by mouth daily as needed. Patient not taking: Reported on 10/08/2024 05/05/24   Sherrilee Belvie CROME, MD  traMADol (ULTRAM) 50 MG tablet Take 50 mg by mouth 2 (two) times daily as needed. 06/09/23   [provider]    Allergies: Patient has no known allergies.    Review  of Systems  Gastrointestinal:  Positive for constipation.    Updated Vital Signs BP (!) 109/59   Pulse 66   Temp 98.5 F (36.9 C) (Oral)   Resp 18   Ht 6' (1.829 m)   Wt 114.8 kg   SpO2 98%   BMI 34.31 kg/m   Physical Exam Vitals and nursing note reviewed.  Constitutional:      General: He is not in acute distress.    Appearance: Normal appearance. He is not toxic-appearing.  HENT:     Head: Normocephalic and atraumatic.  Eyes:     Extraocular Movements: Extraocular movements intact.     Conjunctiva/sclera: Conjunctivae normal.      Pupils: Pupils are equal, round, and reactive to light.  Cardiovascular:     Rate and Rhythm: Normal rate and regular rhythm.     Pulses: Normal pulses.  Pulmonary:     Effort: Pulmonary effort is normal. No respiratory distress.     Comments: Patient has no difficulty speaking complete sentences. Abdominal:     General: Abdomen is flat.     Palpations: Abdomen is soft.     Tenderness: There is abdominal tenderness.     Comments: generalized abdominal tenderness to entire abdomen.  No CVA tenderness, guarding, rebound, or distention.  Musculoskeletal:        General: Normal range of motion.     Cervical back: Normal range of motion.  Skin:    General: Skin is warm and dry.     Capillary Refill: Capillary refill takes less than 2 seconds.  Neurological:     General: No focal deficit present.     Mental Status: He is alert. Mental status is at baseline.  Psychiatric:        Mood and Affect: Mood normal.     (all labs ordered are listed, but only abnormal results are displayed) Labs Reviewed  COMPREHENSIVE METABOLIC PANEL WITH GFR - Abnormal; Notable for the following components:      Result Value   CO2 21 (*)    Glucose, Bld 128 (*)    All other components within normal limits  CBC WITH DIFFERENTIAL/PLATELET - Abnormal; Notable for the following components:   RBC 3.93 (*)    Hemoglobin 12.9 (*)    HCT 38.7 (*)    All other components within normal limits  URINALYSIS, ROUTINE W REFLEX MICROSCOPIC - Abnormal; Notable for the following components:   Glucose, UA >=500 (*)    Bacteria, UA RARE (*)    All other components within normal limits  POC OCCULT BLOOD, ED - Abnormal; Notable for the following components:   Fecal Occult Blood Positive (*)    All other components within normal limits  LIPASE, BLOOD    EKG: EKG Interpretation Date/Time:  Sunday October 24 2024 16:14:22 EST Ventricular Rate:  65 PR Interval:  191 QRS Duration:  104 QT Interval:  427 QTC  Calculation: 444 R Axis:   4  Text Interpretation: Atrial paced rhythm Low voltage, precordial leads Borderline T wave abnormalities Baseline wander No significant change since last tracing Confirmed by Ellouise Fine ([id number]) on 10/25/2024 9:37:52 AM  Radiology: No results found.   Procedures   Medications Ordered in the ED  iohexol  (OMNIPAQUE ) 350 MG/ML injection 100 mL (100 mLs Intravenous Contrast Given 10/24/24 1649)  Medical Decision Making  Patient presents to the ED for: Constipation, bleeding This involves an extensive number of treatment options Differential diagnosis includes: IBS Bowel obstruction/perforation Co-morbid conditions: Asthma, hypertension, atrial fibrillation,heart failure  Additional history/records obtained and reviewed: Additional history obtained from  outside medical records External records from outside source obtained and reviewed including prior pcp records   Clinical Course as of 11/05/24 0713  Sun Oct 24, 2024  1552 Temp: 98.5 F (36.9 C) Afebrile, vital stable, patient in no acute distress [ML]  1552 CBC with Diff(!) Labs per patient baseline [ML]  1616 Lipase, blood WNL [ML]  1617 Comprehensive metabolic panel(!) WNL  [ML]  1633 Foley placed due to urinary retention-900 cc [ML]  1633 Comprehensive metabolic panel(!) No acute findings [ML]  1642 POC occult blood, ED(!) Positive [ML]  1657 Urinalysis, Routine w reflex microscopic -Urine, Clean Catch(!) Unremarkable  [ML]  1828 CT Angio Abd/Pel W and/or Wo Contrast Enlarged prostate, no signs of gastrointestinal bleeding, obstruction, perforation, or diverticulitis [ML]    Clinical Course User Index [ML] Willma Duwaine CROME, PA    Data Reviewed / Actions Taken: Labs ordered/reviewed with my independent interpretation in ED course above. Imaging ordered/reviewed with my independent interpretation in ED course above. I agree with the radiologists  interpretation.  EKG ordered/reviewed with my independent interpretation of atrial paced rhythm.  ED Course / Reassessments: Problem List: Constipation, urinary retention 73 year old male presented for multiple complaints including constipation and urinary retention. Initial assessment included history, physical exam, and review of prior medical records.  Laboratory studies were overall without acute finding.  CT was remarkable for an enlarged prostate with no acute intra-abdominal pathology.  Based on the patient's reassuring vital signs, imaging, and unremarkable diagnostic workup, there is low clinical suspicion for acute surgical abdomen, bowel obstruction, perforation, appendicitis, cholecystitis, pancreatitis, ischemic bowel, or other emergent intra-abdominal process.  The patient presented with 900 cc of urinary retention on arrival-this is likely contributing to most of patient's abdominal discomfort as when Foley was placed and bladder was relieved patient stated notable relief.  Given that patient's report of hematochezia when he wipes, likely due to constipation and castor oil consumption.  Patient was educated not to take castor oil for bowel movements and will be placed on a safer regimen.  The patient remained clinically stable and deemed appropriate for outpatient management.  Discharge instructions included strict return precautions for worsening or localized abdominal pain, persistent vomiting, fever, inability to tolerate oral intake, syncope, or new concerning symptoms.  Foley catheter to be left in and patient was advised to follow-up with urology within the next few days for further care and instruction on foley catheter and urinary retention.   Disposition: Disposition: Discharge with close follow-up with PCP and urology for further evaluation and care  Rationale for disposition: stable for discharge  The disposition plan and rationale were discussed with the patient at the bedside,  all questions were addressed, and the patient demonstrated understanding.  This note was produced using Electronics Engineer. While I have reviewed and verified all clinical information, transcription errors may remain.      Final diagnoses:  Abdominal pain, unspecified abdominal location    ED Discharge Orders          Ordered    polyethylene glycol powder (GLYCOLAX /MIRALAX ) 17 GM/SCOOP powder  Daily        10/24/24 1949               Jayleen Afonso L, [place]  11/05/24 [id number]    Patsey Lot, MD 11/05/24 1523  "

## 2024-10-29 ENCOUNTER — Other Ambulatory Visit (HOSPITAL_COMMUNITY): Payer: Self-pay

## 2024-10-29 DIAGNOSIS — M542 Cervicalgia: Secondary | ICD-10-CM

## 2024-10-29 DIAGNOSIS — M545 Low back pain, unspecified: Secondary | ICD-10-CM

## 2024-11-02 ENCOUNTER — Ambulatory Visit: Payer: Medicare HMO

## 2024-11-02 DIAGNOSIS — I428 Other cardiomyopathies: Secondary | ICD-10-CM | POA: Diagnosis not present

## 2024-11-03 LAB — CUP PACEART REMOTE DEVICE CHECK
Battery Remaining Longevity: 28 mo
Battery Voltage: 2.94 V
Brady Statistic AP VP Percent: 0.07 %
Brady Statistic AP VS Percent: 89.68 %
Brady Statistic AS VP Percent: 0 %
Brady Statistic AS VS Percent: 10.25 %
Brady Statistic RA Percent Paced: 88.48 %
Brady Statistic RV Percent Paced: 0.09 %
Date Time Interrogation Session: 20260106022725
HighPow Impedance: 69 Ohm
Implantable Lead Connection Status: 753985
Implantable Lead Connection Status: 753985
Implantable Lead Implant Date: 20190411
Implantable Lead Implant Date: 20190411
Implantable Lead Location: 753859
Implantable Lead Location: 753860
Implantable Lead Model: 5076
Implantable Lead Model: 6935
Implantable Pulse Generator Implant Date: 20190411
Lead Channel Impedance Value: 399 Ohm
Lead Channel Impedance Value: 456 Ohm
Lead Channel Impedance Value: 532 Ohm
Lead Channel Pacing Threshold Amplitude: 0.5 V
Lead Channel Pacing Threshold Amplitude: 0.875 V
Lead Channel Pacing Threshold Pulse Width: 0.4 ms
Lead Channel Pacing Threshold Pulse Width: 0.4 ms
Lead Channel Sensing Intrinsic Amplitude: 2.75 mV
Lead Channel Sensing Intrinsic Amplitude: 2.75 mV
Lead Channel Sensing Intrinsic Amplitude: 6.25 mV
Lead Channel Sensing Intrinsic Amplitude: 6.25 mV
Lead Channel Setting Pacing Amplitude: 2 V
Lead Channel Setting Pacing Amplitude: 2.5 V
Lead Channel Setting Pacing Pulse Width: 0.4 ms
Lead Channel Setting Sensing Sensitivity: 0.3 mV
Zone Setting Status: 755011
Zone Setting Status: 755011

## 2024-11-05 ENCOUNTER — Ambulatory Visit: Admitting: Urology

## 2024-11-05 VITALS — BP 125/74 | HR 73

## 2024-11-05 DIAGNOSIS — N5201 Erectile dysfunction due to arterial insufficiency: Secondary | ICD-10-CM | POA: Diagnosis not present

## 2024-11-05 DIAGNOSIS — R338 Other retention of urine: Secondary | ICD-10-CM | POA: Diagnosis not present

## 2024-11-05 DIAGNOSIS — N401 Enlarged prostate with lower urinary tract symptoms: Secondary | ICD-10-CM

## 2024-11-05 DIAGNOSIS — R339 Retention of urine, unspecified: Secondary | ICD-10-CM

## 2024-11-05 DIAGNOSIS — N400 Enlarged prostate without lower urinary tract symptoms: Secondary | ICD-10-CM

## 2024-11-05 DIAGNOSIS — R351 Nocturia: Secondary | ICD-10-CM | POA: Diagnosis not present

## 2024-11-05 MED ORDER — CIPROFLOXACIN HCL 500 MG PO TABS
500.0000 mg | ORAL_TABLET | Freq: Once | ORAL | Status: AC
  Administered 2024-11-05: 500 mg via ORAL

## 2024-11-05 MED ORDER — SILODOSIN 8 MG PO CAPS: 8.0000 mg | ORAL_CAPSULE | Freq: Every day | ORAL | 11 refills | Status: AC

## 2024-11-05 NOTE — Progress Notes (Unsigned)
 Fill and Pull Catheter Removal  Patient is present today for a catheter removal due to urinary retention.  200 ml of sterile water was instilled into the bladder when the patient felt the urge to urinate he had a bladder spasm. 10 ml of water was then drained from the balloon.  A 16 FR foley cath was removed from the bladder no complications were noted .  Foley catheter intact and time of removal. Patient tolerated well. One oral prophylactic antibiotic given per MD orders  Performed by: Exie T. CMA  Follow up/ Additional notes: as scheduled w/ MD

## 2024-11-05 NOTE — Progress Notes (Unsigned)
 "  11/05/2024 9:44 AM   Craig Robinson April 25, 1951 [id number]  Referring provider: Maree Isles, MD [address] [place],  [place] [address]  No chief complaint on file.   HPI: Mr Craig Robinson is a 73yo here for followup for BPH and oab. IPSS 12 QOL 5 on uroxatral  10mg  BID and vesicare . He had a foley placed 12/28 due to urinary retention. He had severe constipation at the time.    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   Asthma    Hypertension     Surgical History: Past Surgical History:  Procedure Laterality Date   ICD IMPLANT N/A 02/05/2018   Procedure: ICD IMPLANT;  Surgeon: Craig Robinson ORN, MD;  Location: [facility] INVASIVE CV LAB;  Service: Cardiovascular;  Laterality: N/A;   LEFT HEART CATH AND CORONARY ANGIOGRAPHY N/A 07/01/2017   Procedure: LEFT HEART CATH AND CORONARY ANGIOGRAPHY;  Surgeon: Craig Candyce RAMAN, MD;  Location: [facility] INVASIVE CV LAB;  Service: Cardiovascular;  Laterality: N/A;    Home Medications:  Allergies as of 11/05/2024   No Known Allergies      Medication List        Accurate as of November 05, 2024  9:44 AM. If you have any questions, ask your nurse or doctor.          alfuzosin  10 MG 24 hr tablet Commonly known as: UROXATRAL  Take 1 tablet (10 mg total) by mouth 2 (two) times daily.   amiodarone  200 MG tablet Commonly known as: PACERONE  TAKE 1 TABLET BY MOUTH DAILY  MONDAY THROUGH SATURDAY AND NONE ON SUNDAY   atorvastatin  40 MG tablet Commonly known as: LIPITOR TAKE 1 TABLET BY MOUTH DAILY   azelastine 0.1 % nasal spray Commonly known as: ASTELIN Place 1 spray into both nostrils 2 (two) times daily.   carvedilol  25 MG tablet Commonly known as: COREG  TAKE ONE-HALF TABLET BY MOUTH IN THE MORNING AND 1 TABLET IN THE  EVENING   Eliquis  5 MG Tabs tablet Generic drug: apixaban  TAKE 1 TABLET BY MOUTH TWICE  DAILY   Entresto  97-103 MG Generic drug:  sacubitril -valsartan  TAKE 1 TABLET BY MOUTH TWICE  DAILY   Farxiga  10 MG Tabs tablet Generic drug: dapagliflozin  propanediol TAKE 1 TABLET BY MOUTH DAILY  BEFORE BREAKFAST   furosemide  40 MG tablet Commonly known as: LASIX  Take 1 tablet (40 mg total) by mouth daily as needed.   OVER THE COUNTER MEDICATION Take 2 tablets by mouth at bedtime. Pain Relief Tablets   polyethylene glycol powder 17 GM/SCOOP powder Commonly known as: GLYCOLAX /MIRALAX  Take 17 g by mouth daily. Dissolve 1 capful (17g) in 4-8 ounces of liquid and take by mouth daily.   solifenacin  10 MG tablet Commonly known as: VESICARE  Take 1 tablet (10 mg total) by mouth daily.   spironolactone  25 MG tablet Commonly known as: ALDACTONE  TAKE 1 TABLET BY MOUTH DAILY   tadalafil  20 MG tablet Commonly known as: CIALIS  Take 1 tablet (20 mg total) by mouth daily as needed.   traMADol 50 MG tablet Commonly known as: ULTRAM Take 50 mg by mouth 2 (two) times daily as needed.        Allergies: Allergies[1]  Family History: Family History  Problem Relation Age of Onset   Alzheimer's disease Mother    Cancer Father     Social History:  reports that he has never smoked. He has never used smokeless tobacco. He reports that he does not drink alcohol  and  does not use drugs.  ROS: All other review of systems were reviewed and are negative except what is noted above in HPI  Physical Exam: BP 125/74   Pulse 73   Constitutional:  Alert and oriented, No acute distress. HEENT: [place] AT, moist mucus membranes.  Trachea midline, no masses. Cardiovascular: No clubbing, cyanosis, or edema. Respiratory: Normal respiratory effort, no increased work of breathing. GI: Abdomen is soft, nontender, nondistended, no abdominal masses GU: No CVA tenderness.  Lymph: No cervical or inguinal lymphadenopathy. Skin: No rashes, bruises or suspicious lesions. Neurologic: Grossly intact, no focal deficits, moving all 4  extremities. Psychiatric: Normal mood and affect.  Laboratory Data: Lab Results  Component Value Date   WBC 8.0 10/24/2024   HGB 12.9 (L) 10/24/2024   HCT 38.7 (L) 10/24/2024   MCV 98.5 10/24/2024   PLT 194 10/24/2024    Lab Results  Component Value Date   CREATININE 1.13 10/24/2024    No results found for: PSA  No results found for: TESTOSTERONE  Lab Results  Component Value Date   HGBA1C 5.4 07/01/2017    Urinalysis    Component Value Date/Time   COLORURINE YELLOW 10/24/2024 1633   APPEARANCEUR CLEAR 10/24/2024 1633   APPEARANCEUR Clear 05/05/2024 0825   LABSPEC 1.024 10/24/2024 1633   PHURINE 5.0 10/24/2024 1633   GLUCOSEU >=500 (A) 10/24/2024 1633   HGBUR NEGATIVE 10/24/2024 1633   BILIRUBINUR NEGATIVE 10/24/2024 1633   BILIRUBINUR Negative 05/05/2024 0825   KETONESUR NEGATIVE 10/24/2024 1633   PROTEINUR NEGATIVE 10/24/2024 1633   NITRITE NEGATIVE 10/24/2024 1633   LEUKOCYTESUR NEGATIVE 10/24/2024 1633    Lab Results  Component Value Date   LABMICR Comment 05/05/2024   WBCUA 0-5 08/28/2022   LABEPIT 0-10 08/28/2022   BACTERIA RARE (A) 10/24/2024    Pertinent Imaging: *** No results found for this or any previous visit.  No results found for this or any previous visit.  No results found for this or any previous visit.  No results found for this or any previous visit.  No results found for this or any previous visit.  No results found for this or any previous visit.  No results found for this or any previous visit.  No results found for this or any previous visit.   Assessment & Plan:    1. Benign prostatic hyperplasia, unspecified whether lower urinary tract symptoms present (Primary) ***  2. Urinary retention Switch to rapalfo 8mg  daily   3. Nocturia Stop vesicare . We will trial PTNS  4. Erectile dysfunction due to arterial insufficiency ***   No follow-ups on file.  Craig Clara, MD  [facility]  [place]      [1] No Known Allergies  "

## 2024-11-06 ENCOUNTER — Ambulatory Visit: Payer: Self-pay | Admitting: Cardiovascular Disease

## 2024-11-08 NOTE — Progress Notes (Signed)
 Remote ICD Transmission

## 2024-11-09 ENCOUNTER — Encounter: Payer: Self-pay | Admitting: Urology

## 2024-11-09 NOTE — Patient Instructions (Signed)
 Trouble Peeing (Acute Urinary Retention) in Males: What to Know Acute urinary retention is when a person can't pee at all or can only pee a little. This can come on all of a sudden. If it's not treated, it can lead to kidney problems or other serious problems. What are the causes? Acute urinary retention may be caused by: A problem with the urethra. This is the tube that drains pee from the bladder. Problems with the nerves in the bladder. Tumors. Some medicines. An infection. Having trouble pooping (constipation). What increases the risk? Older males are more at risk because their prostate gland may get larger as they age. Other health problems can also raise the risk. These include: Multiple sclerosis. Injury to the spinal cord. Diabetes. A condition that affects the way the brain works, such as dementia. Mental health problems. Having urinary retention before. What are the signs or symptoms? Trouble peeing. Pain in the lower belly. How is this treated? Treatment may include: Medicines. Treatment for problems that may cause this. Placing a soft tube called a catheter into the bladder to drain pee out of the body. Therapy to treat mental health problems. If needed, you may be treated in the hospital for kidney problems. Follow these instructions at home: Medicines Take medicines only as told. Do not take any medicine unless your doctor says it's OK. If you were given antibiotics, take them as told. Do not stop taking them even if you start to feel better. General instructions Do not smoke, vape, or use nicotine or tobacco. Drink more fluids as told. If you need to use a catheter, follow the instructions closely. This will help prevent a bladder infection. If told, keep track of changes in your blood pressure at home. Tell your doctor about them. Contact a doctor if: You have bladder cramping, called spasms. You leak pee when you have spasms. You have a fever or chills. You  have blood in your pee. Get help right away if: You can't pee. This information is not intended to replace advice given to you by your health care provider. Make sure you discuss any questions you have with your health care provider. Document Revised: 06/12/2023 Document Reviewed: 06/12/2023 Elsevier Patient Education  2024 [employer].

## 2024-12-02 NOTE — CV Procedure (Signed)
" °  Device system confirmed to be MRI conditional, with implant date > 6 weeks ago, and no evidence of abandoned or epicardial leads in review of most recent CXR  Device last cleared by EP Provider: Prentice Passey 12/02/2024  Clearance is good through for 1 year as long as parameters remain stable at time of check. If pt undergoes a cardiac device procedure during that time, they should be re-cleared.   Tachy-therapies to be programmed off if applicable with device back to pre-MRI settings after completion of exam.  [employer] - Programming recommendation received through [employer] App/Tablet  Rocky Catalan, RT  12/02/2024 11:38 AM     "

## 2024-12-07 ENCOUNTER — Ambulatory Visit (HOSPITAL_COMMUNITY)

## 2024-12-07 ENCOUNTER — Ambulatory Visit (HOSPITAL_COMMUNITY): Admission: RE | Admit: 2024-12-07 | Source: Ambulatory Visit

## 2024-12-13 ENCOUNTER — Ambulatory Visit

## 2025-01-03 ENCOUNTER — Ambulatory Visit: Admitting: Cardiology
